# Patient Record
Sex: Female | Born: 1950 | Race: Black or African American | Hispanic: No | Marital: Married | Smoking: Never smoker
Health system: Southern US, Community
[De-identification: ages and names within clinical notes are randomized; demographics above are authoritative.]

## PROBLEM LIST (undated history)

## (undated) DIAGNOSIS — Z8041 Family history of malignant neoplasm of ovary: Secondary | ICD-10-CM

## (undated) DIAGNOSIS — Z9889 Other specified postprocedural states: Secondary | ICD-10-CM

## (undated) DIAGNOSIS — I10 Essential (primary) hypertension: Secondary | ICD-10-CM

## (undated) DIAGNOSIS — Z803 Family history of malignant neoplasm of breast: Secondary | ICD-10-CM

## (undated) DIAGNOSIS — M21612 Bunion of left foot: Secondary | ICD-10-CM

## (undated) DIAGNOSIS — T8859XA Other complications of anesthesia, initial encounter: Secondary | ICD-10-CM

## (undated) DIAGNOSIS — R232 Flushing: Secondary | ICD-10-CM

## (undated) DIAGNOSIS — T4145XA Adverse effect of unspecified anesthetic, initial encounter: Secondary | ICD-10-CM

## (undated) DIAGNOSIS — R112 Nausea with vomiting, unspecified: Secondary | ICD-10-CM

## (undated) DIAGNOSIS — T7840XA Allergy, unspecified, initial encounter: Secondary | ICD-10-CM

## (undated) DIAGNOSIS — E119 Type 2 diabetes mellitus without complications: Secondary | ICD-10-CM

## (undated) HISTORY — DX: Allergy, unspecified, initial encounter: T78.40XA

## (undated) HISTORY — DX: Bunion of left foot: M21.612

## (undated) HISTORY — DX: Flushing: R23.2

## (undated) HISTORY — DX: Family history of malignant neoplasm of ovary: Z80.41

## (undated) HISTORY — DX: Family history of malignant neoplasm of breast: Z80.3

## (undated) HISTORY — DX: Type 2 diabetes mellitus without complications: E11.9

## (undated) HISTORY — DX: Essential (primary) hypertension: I10

---
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
## 2000-05-14 HISTORY — PX: VAGINAL HYSTERECTOMY: SUR661

## 2005-06-19 ENCOUNTER — Ambulatory Visit: Payer: Self-pay | Admitting: Family Medicine

## 2005-07-03 ENCOUNTER — Encounter: Admission: RE | Admit: 2005-07-03 | Discharge: 2005-07-03 | Payer: Self-pay | Admitting: Family Medicine

## 2005-07-12 ENCOUNTER — Ambulatory Visit: Payer: Self-pay | Admitting: Family Medicine

## 2005-08-20 ENCOUNTER — Ambulatory Visit: Payer: Self-pay | Admitting: Family Medicine

## 2005-12-04 ENCOUNTER — Ambulatory Visit: Payer: Self-pay | Admitting: Family Medicine

## 2005-12-11 ENCOUNTER — Ambulatory Visit: Payer: Self-pay | Admitting: Family Medicine

## 2006-07-04 ENCOUNTER — Encounter: Admission: RE | Admit: 2006-07-04 | Discharge: 2006-07-04 | Payer: Self-pay | Admitting: Family Medicine

## 2006-07-18 ENCOUNTER — Encounter: Admission: RE | Admit: 2006-07-18 | Discharge: 2006-07-18 | Payer: Self-pay | Admitting: Family Medicine

## 2006-12-05 ENCOUNTER — Ambulatory Visit: Payer: Self-pay | Admitting: Family Medicine

## 2006-12-05 LAB — CONVERTED CEMR LAB
AST: 24 units/L (ref 0–37)
BUN: 13 mg/dL (ref 6–23)
Basophils Absolute: 0.1 10*3/uL (ref 0.0–0.1)
Bilirubin, Direct: 0.1 mg/dL (ref 0.0–0.3)
CO2: 28 meq/L (ref 19–32)
Calcium: 9.9 mg/dL (ref 8.4–10.5)
Cholesterol: 231 mg/dL (ref 0–200)
Direct LDL: 136.9 mg/dL
Glucose, Bld: 110 mg/dL — ABNORMAL HIGH (ref 70–99)
Hemoglobin: 12.5 g/dL (ref 12.0–15.0)
Lymphocytes Relative: 46.8 % — ABNORMAL HIGH (ref 12.0–46.0)
MCHC: 34.3 g/dL (ref 30.0–36.0)
MCV: 83.1 fL (ref 78.0–100.0)
Monocytes Absolute: 0.6 10*3/uL (ref 0.2–0.7)
Monocytes Relative: 8.9 % (ref 3.0–11.0)
Neutrophils Relative %: 40.2 % — ABNORMAL LOW (ref 43.0–77.0)
Nitrite: NEGATIVE
Platelets: 365 10*3/uL (ref 150–400)
Protein, U semiquant: NEGATIVE
RBC: 4.37 M/uL (ref 3.87–5.11)
RDW: 12.2 % (ref 11.5–14.6)
Sodium: 142 meq/L (ref 135–145)
TSH: 2.26 microintl units/mL (ref 0.35–5.50)
Urobilinogen, UA: 0.2
VLDL: 42 mg/dL — ABNORMAL HIGH (ref 0–40)
WBC Urine, dipstick: NEGATIVE
pH: 5.5

## 2007-01-14 ENCOUNTER — Encounter: Admission: RE | Admit: 2007-01-14 | Discharge: 2007-01-14 | Payer: Self-pay | Admitting: Family Medicine

## 2007-01-20 DIAGNOSIS — I10 Essential (primary) hypertension: Secondary | ICD-10-CM

## 2007-01-23 ENCOUNTER — Ambulatory Visit: Payer: Self-pay | Admitting: Family Medicine

## 2007-07-07 ENCOUNTER — Encounter: Admission: RE | Admit: 2007-07-07 | Discharge: 2007-07-07 | Payer: Self-pay | Admitting: Family Medicine

## 2007-10-27 ENCOUNTER — Ambulatory Visit: Payer: Self-pay | Admitting: Family Medicine

## 2007-10-27 DIAGNOSIS — J309 Allergic rhinitis, unspecified: Secondary | ICD-10-CM | POA: Insufficient documentation

## 2007-11-03 ENCOUNTER — Ambulatory Visit: Payer: Self-pay | Admitting: Family Medicine

## 2007-11-03 DIAGNOSIS — H6120 Impacted cerumen, unspecified ear: Secondary | ICD-10-CM

## 2008-01-20 ENCOUNTER — Ambulatory Visit: Payer: Self-pay | Admitting: Family Medicine

## 2008-01-20 LAB — CONVERTED CEMR LAB
ALT: 23 units/L (ref 0–35)
AST: 26 units/L (ref 0–37)
Albumin: 4.1 g/dL (ref 3.5–5.2)
Chloride: 104 meq/L (ref 96–112)
Creatinine, Ser: 0.8 mg/dL (ref 0.4–1.2)
Direct LDL: 125.5 mg/dL
Eosinophils Absolute: 0.1 10*3/uL (ref 0.0–0.7)
Glucose, Bld: 121 mg/dL — ABNORMAL HIGH (ref 70–99)
HDL: 37.7 mg/dL — ABNORMAL LOW (ref >39.0)
MCHC: 32.8 g/dL (ref 30.0–36.0)
MCV: 84.7 fL (ref 78.0–100.0)
Monocytes Absolute: 0.6 10*3/uL (ref 0.1–1.0)
Neutrophils Relative %: 37.3 % — ABNORMAL LOW (ref 43.0–77.0)
Nitrite: NEGATIVE
Platelets: 349 10*3/uL (ref 150–400)
Potassium: 3.4 meq/L — ABNORMAL LOW (ref 3.5–5.1)
RBC: 4.76 M/uL (ref 3.87–5.11)
Specific Gravity, Urine: 1.02
TSH: 3.97 microintl units/mL (ref 0.35–5.50)
VLDL: 72 mg/dL — ABNORMAL HIGH (ref 0–40)
WBC: 7 10*3/uL (ref 4.5–10.5)
pH: 6

## 2008-01-26 ENCOUNTER — Ambulatory Visit: Payer: Self-pay | Admitting: Family Medicine

## 2008-01-26 DIAGNOSIS — N951 Menopausal and female climacteric states: Secondary | ICD-10-CM

## 2008-02-25 ENCOUNTER — Ambulatory Visit: Payer: Self-pay | Admitting: Family Medicine

## 2008-04-16 ENCOUNTER — Ambulatory Visit: Payer: Self-pay | Admitting: Family Medicine

## 2008-04-26 ENCOUNTER — Ambulatory Visit: Payer: Self-pay | Admitting: Family Medicine

## 2008-07-12 ENCOUNTER — Ambulatory Visit: Payer: Self-pay | Admitting: Family Medicine

## 2008-07-12 ENCOUNTER — Encounter: Admission: RE | Admit: 2008-07-12 | Discharge: 2008-07-12 | Payer: Self-pay | Admitting: Family Medicine

## 2008-07-12 DIAGNOSIS — N3000 Acute cystitis without hematuria: Secondary | ICD-10-CM | POA: Insufficient documentation

## 2008-07-12 LAB — CONVERTED CEMR LAB
Bilirubin Urine: NEGATIVE
Specific Gravity, Urine: 1.01
Urobilinogen, UA: 0.2

## 2009-01-27 ENCOUNTER — Ambulatory Visit: Payer: Self-pay | Admitting: Family Medicine

## 2009-01-27 LAB — CONVERTED CEMR LAB
ALT: 20 units/L (ref 0–35)
AST: 25 units/L (ref 0–37)
Albumin: 4 g/dL (ref 3.5–5.2)
BUN: 11 mg/dL (ref 6–23)
Bilirubin Urine: NEGATIVE
Bilirubin, Direct: 0 mg/dL (ref 0.0–0.3)
Chloride: 105 meq/L (ref 96–112)
Cholesterol: 228 mg/dL — ABNORMAL HIGH (ref 0–200)
Creatinine, Ser: 0.9 mg/dL (ref 0.4–1.2)
HDL: 38.2 mg/dL — ABNORMAL LOW (ref >39.00)
Lymphocytes Relative: 52.3 % — ABNORMAL HIGH (ref 12.0–46.0)
Lymphs Abs: 3.6 10*3/uL (ref 0.7–4.0)
Monocytes Relative: 8.9 % (ref 3.0–12.0)
Neutro Abs: 2.5 10*3/uL (ref 1.4–7.7)
Neutrophils Relative %: 35.6 % — ABNORMAL LOW (ref 43.0–77.0)
Platelets: 354 10*3/uL (ref 150.0–400.0)
Protein, U semiquant: NEGATIVE
RBC: 4.63 M/uL (ref 3.87–5.11)
RDW: 11.8 % (ref 11.5–14.6)
Total CHOL/HDL Ratio: 6
Total Protein: 7.3 g/dL (ref 6.0–8.3)
Triglycerides: 206 mg/dL — ABNORMAL HIGH (ref 0.0–149.0)

## 2009-02-02 ENCOUNTER — Ambulatory Visit: Payer: Self-pay | Admitting: Family Medicine

## 2009-02-16 ENCOUNTER — Ambulatory Visit: Payer: Self-pay | Admitting: Family Medicine

## 2009-06-10 ENCOUNTER — Ambulatory Visit: Payer: Self-pay | Admitting: Family Medicine

## 2009-07-13 ENCOUNTER — Telehealth: Payer: Self-pay | Admitting: Family Medicine

## 2009-08-02 ENCOUNTER — Encounter: Admission: RE | Admit: 2009-08-02 | Discharge: 2009-08-02 | Payer: Self-pay | Admitting: Family Medicine

## 2010-01-27 ENCOUNTER — Ambulatory Visit: Payer: Self-pay | Admitting: Family Medicine

## 2010-01-27 LAB — CONVERTED CEMR LAB
ALT: 24 units/L (ref 0–35)
Alkaline Phosphatase: 74 units/L (ref 39–117)
BUN: 14 mg/dL (ref 6–23)
Bilirubin, Direct: 0.2 mg/dL (ref 0.0–0.3)
CO2: 30 meq/L (ref 19–32)
Chloride: 100 meq/L (ref 96–112)
Creatinine, Ser: 1 mg/dL (ref 0.4–1.2)
Eosinophils Absolute: 0.2 10*3/uL (ref 0.0–0.7)
Eosinophils Relative: 2.2 % (ref 0.0–5.0)
GFR calc non Af Amer: 76.89 mL/min (ref >60)
HCT: 39.6 % (ref 36.0–46.0)
Hemoglobin: 13 g/dL (ref 12.0–15.0)
MCHC: 32.8 g/dL (ref 30.0–36.0)
Monocytes Relative: 7.6 % (ref 3.0–12.0)
Nitrite: NEGATIVE
Platelets: 349 10*3/uL (ref 150.0–400.0)
Protein, U semiquant: NEGATIVE
RDW: 13 % (ref 11.5–14.6)
Specific Gravity, Urine: 1.025
TSH: 3.81 microintl units/mL (ref 0.35–5.50)
Total Bilirubin: 0.9 mg/dL (ref 0.3–1.2)
Triglycerides: 332 mg/dL — ABNORMAL HIGH (ref 0.0–149.0)
Urobilinogen, UA: 0.2
WBC Urine, dipstick: NEGATIVE

## 2010-02-16 ENCOUNTER — Ambulatory Visit: Payer: Self-pay | Admitting: Family Medicine

## 2010-02-16 ENCOUNTER — Encounter: Payer: Self-pay | Admitting: Family Medicine

## 2010-03-23 ENCOUNTER — Ambulatory Visit: Payer: Self-pay | Admitting: Family Medicine

## 2010-03-23 DIAGNOSIS — M79609 Pain in unspecified limb: Secondary | ICD-10-CM

## 2010-03-27 LAB — CONVERTED CEMR LAB
BUN: 12 mg/dL (ref 6–23)
CO2: 26 meq/L (ref 19–32)
Calcium: 9.9 mg/dL (ref 8.4–10.5)
Creatinine, Ser: 0.9 mg/dL (ref 0.4–1.2)
GFR calc non Af Amer: 85.06 mL/min (ref >60)
Hgb A1c MFr Bld: 6.6 % — ABNORMAL HIGH (ref 4.6–6.5)
TSH: 2.49 microintl units/mL (ref 0.35–5.50)

## 2010-04-26 ENCOUNTER — Ambulatory Visit: Payer: Self-pay | Admitting: Family Medicine

## 2010-04-28 ENCOUNTER — Ambulatory Visit: Payer: Self-pay | Admitting: Family Medicine

## 2010-05-11 ENCOUNTER — Ambulatory Visit
Admission: RE | Admit: 2010-05-11 | Discharge: 2010-05-11 | Payer: Self-pay | Source: Home / Self Care | Attending: Internal Medicine | Admitting: Internal Medicine

## 2010-06-04 ENCOUNTER — Encounter: Payer: Self-pay | Admitting: Family Medicine

## 2010-06-13 NOTE — Assessment & Plan Note (Signed)
Summary: CPX/CJR   Vital Signs:  Patient profile:   60 year old female Menstrual status:  hysterectomy Height:      63 inches Weight:      178 pounds BMI:     31.65 Temp:     98.1 degrees F oral BP sitting:   130 / 90  (left arm) Cuff size:   regular  Vitals Entered By: Kern Reap CMA Duncan Dull) (February 16, 2010 1:53 PM) CC: cpx Is Patient Diabetic? Yes   CC:  cpx.  History of Present Illness: Erin Hatfield is a delightful, 60 year old female, married, nonsmoker, who comes in today for physical evaluation because of a history of hypertension, allergic rhinitis postmenopausal vaginal dryness.  For hypertension.  She takes Tenoretic 50 -- 25 daily.  BP 130/90.  She also uses Premarin vaginal cream for vaginal dryness, 81 mg, baby aspirin, Zyrtec, 10 mg daily, Singulair, 10 mg daily, steroid nasal spray b.i.d. for allergic rhinitis.  Last year.  She took a round of prednisone.  She stopped it didn't help and that symptoms got worse therefore, she went to an allergist to put her on the above medican.  She gets routine eye care, dental care, BSE monthly, any mammography, colonoscopy, normal 2006, tetanus, 2007, seasonal flu shot today.  Allergies (verified): 1)  ! * Trees 2)  ! * Mold  Past History:  Past medical, surgical, family and social histories (including risk factors) reviewed, and no changes noted (except as noted below).  Past Medical History: Reviewed history from 01/26/2008 and no changes required. Hypertension Foot Bunion Allergic rhinitis childbirth x 1 TAH 04, fibroids postmenopausal hot flashes  Past Surgical History: Reviewed history from 01/20/2007 and no changes required. CB x1 TAH Hysterectomy  Family History: Reviewed history from 01/20/2007 and no changes required. Family History Diabetes 1st degree relative Family History Hypertension  Social History: Reviewed history from 01/26/2008 and no changes required. 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 No apparent alopecia or balding. Eyes:  No corneal or conjunctival inflammation noted. EOMI. Perrla. Funduscopic exam benign, without hemorrhages, exudates or papilledema. Vision grossly normal. Ears:  External ear exam shows no significant lesions or deformities.  Otoscopic examination reveals clear canals, tympanic membranes are intact bilaterally without bulging, retraction, inflammation or discharge. Hearing is grossly normal bilaterally. Nose:  External nasal examination shows no deformity or inflammation. Nasal mucosa are pink and moist without lesions or exudates. Mouth:  Oral mucosa and oropharynx without lesions or exudates.  Teeth in good repair. Neck:  No deformities, masses, or tenderness noted. Chest Wall:  No deformities, masses, or tenderness noted. Breasts:  No mass, nodules, thickening, tenderness, bulging, retraction, inflamation, nipple discharge or skin changes noted.   Lungs:  Normal respiratory effort, chest expands symmetrically. Lungs are clear to auscultation, no crackles or wheezes. Heart:  Normal rate and regular rhythm. S1 and S2 normal without gallop, murmur, click, rub or other extra sounds. Abdomen:  Bowel sounds positive,abdomen soft and non-tender without masses, organomegaly or hernias noted. Rectal:  No external abnormalities noted. Normal sphincter tone. No rectal masses or tenderness. Genitalia:  Pelvic Exam:        External: normal female genitalia without lesions or masses        Vagina: normal without lesions or masses  Cervix: normal without lesions or masses        Adnexa: normal bimanual exam without masses or fullness        Uterus: normal by  palpation        Pap smear: not performed Msk:  No deformity or scoliosis noted of thoracic or lumbar spine.   Pulses:  R and L carotid,radial,femoral,dorsalis pedis and posterior tibial pulses are full and equal bilaterally Extremities:  No clubbing, cyanosis, edema, or deformity noted with normal full range of motion of all joints.   Neurologic:  No cranial nerve deficits noted. Station and gait are normal. Plantar reflexes are down-going bilaterally. DTRs are symmetrical throughout. Sensory, motor and coordinative functions appear intact. Skin:  Intact without suspicious lesions or rashes Cervical Nodes:  No lymphadenopathy noted Axillary Nodes:  No palpable lymphadenopathy Inguinal Nodes:  No significant adenopathy Psych:  Cognition and judgment appear intact. Alert and cooperative with normal attention span and concentration. No apparent delusions, illusions, hallucinations   Impression & Recommendations:  Problem # 1:  ALLERGIC RHINITIS (ICD-477.9) Assessment Improved  Her updated medication list for this problem includes:    Zyrtec Hives Relief 10 Mg Tabs (Cetirizine hcl) .Marland Kitchen... Take one daily as needed    Flonase 50 Mcg/act Susp (Fluticasone propionate) .Marland Kitchen... 2 sprays per nostril once daily  Orders: Prescription Created Electronically [phone])  Problem # 2:  HYPERTENSION (ICD-401.9) Assessment: Improved  Her updated medication list for this problem includes:    Tenoretic 50 50-25 Mg Tabs (Atenolol-chlorthalidone) ..... Once daily  Orders: Prescription Created Electronically [phone])  Problem # 3:  Preventive Health Care (ICD-V70.0) Assessment: Unchanged  Complete Medication List: 1)  Tenoretic 50 50-25 Mg Tabs (Atenolol-chlorthalidone) .... Once daily 2)  Omega-3 350 Mg Caps (Omega-3 fatty acids) .... Once daily 3)  Aspirin 81 Mg Tbec (Aspirin) .... Once daily 4)  Vitamin D 1000 Unit Caps (Cholecalciferol) .... Once daily 5)  Premarin 0.625 Mg/gm Crea (Estrogens,  conjugated) .... Uad 6)  Prednisone 20 Mg Tabs (Prednisone) .... Uad 7)  Zyrtec Hives Relief 10 Mg Tabs (Cetirizine hcl) .... Take one daily as needed 8)  Singulair 10 Mg Tabs (Montelukast sodium) .... Take one tab by mouth once daily as needed 9)  Flonase 50 Mcg/act Susp (Fluticasone propionate) .... 2 sprays per nostril once daily  Patient Instructions: 1)  7 nights every month, use the cortisone cream, apply small amounts with a Q-tip to each ear canal. 2)  Please schedule a follow-up appointment in 1 year. 3)  It is important that you exercise regularly at least 20 minutes 5 times a week. If you develop chest pain, have severe difficulty breathing, or feel very tired , stop exercising immediately and seek medical attention. 4)  Schedule your mammogram. 5)  Schedule a colonoscopy/sigmoidoscopy to help detect colon cancer. 6)  Take calcium +Vitamin D daily. 7)  Take an Aspirin every day. Prescriptions: FLONASE 50 MCG/ACT SUSP (FLUTICASONE PROPIONATE) 2 sprays per nostril once daily  #3 x 3   Entered and Authorized by:   Roderick Pee MD   Signed by:   Roderick Pee MD on 02/16/2010   Method used:   Electronically to        [facility]  [address] [id number] (retail)       [address]       [place], [place]  [address]       Ph: [phone] or [phone]  Fax: [phone]   RxID:   [id number] SINGULAIR 10 MG TABS (MONTELUKAST SODIUM) take one tab by mouth once daily as needed  #100 x 3   Entered and Authorized by:   Roderick Pee MD   Signed by:   Roderick Pee MD on 02/16/2010   Method used:   [facility]ronically to        [facility]  [address] [id number] ([facility]ail)       [address]       [place], [place]  [address]       Ph: [phone] or [phone]       Fax: [phone]   RxID:   [id number] PREMARIN 0.625 MG/GM CREA (ESTROGENS, CONJUGATED) UAD  #3 tubes x 6   Entered and Authorized by:   Roderick Pee MD   Signed by:   Roderick Pee MD on 02/16/2010   Method used:   [facility] to        [facility]  [address] [id number] ([facility]       [address]       [place], [place]  [address]       Ph: [phone] or [phone]       Fax: [phone]   RxID:   [id number] TENORETIC 50 50-25 MG  TABS (ATENOLOL-CHLORTHALIDONE) once daily  #100 x 3   Entered and Authorized by:   Roderick Pee MD   Signed by:   Roderick Pee MD on 02/16/2010   Method used:   [facility]ronically to        [facility]  [address] [id number] ([facility]       [address]       [place], [place]  [address]       Ph: [phone] or [phone]       Fax: [phone]   RxID:   [id number]

## 2010-06-13 NOTE — Progress Notes (Signed)
Summary: allergies worse  Phone Note Call from Patient Call back at Home Phone [phone]   Caller: Patient Summary of Call: allergies got even worse after coming off prednisone- sneezing, coughing and drainage. Another OV or medicine[facility] Initial call taken by: Raechel Ache, RN,  July 13, 2009 8:22 AM  Follow-up for Phone Call        I would recommend OTC Claritin, regular, one daily in the morning or otc zyrtec  regular, one in the evening Follow-up by: Roderick Pee MD,  July 13, 2009 9:56 AM  Additional Follow-up for Phone Call Additional follow up Details #1::        Phone Call Completed Additional Follow-up by: Kern Reap CMA Duncan Dull),  July 13, 2009 12:55 PM

## 2010-06-13 NOTE — Assessment & Plan Note (Signed)
Summary: sinus/cough/sore throat/per Rachel/cjr   Vital Signs:  Patient profile:   60 year old female Menstrual status:  hysterectomy Weight:      182 pounds Temp:     98.6 degrees F oral BP sitting:   116 / 80  Vitals Entered By: Kern Reap CMA Duncan Dull) (June 10, 2009 10:57 AM) CC: sinus and allergy complaints Is Patient Diabetic? No   CC:  sinus and allergy complaints.  History of Present Illness: Erin Hatfield is a 60 year old female with underlying allergic rhinitis.  This been on Claritin, 10 mg daily, x 3 weeks, with no relief of her symptoms.  She has had congestion, postnasal drip, sneezing, and cough.  No wheezing.  Environmental review of systems negative  Current Medications (verified): 1)  Tenoretic 50 50-25 Mg  Tabs (Atenolol-Chlorthalidone) .... Once Daily 2)  Omega-3 350 Mg Caps (Omega-3 Fatty Acids) .... Once Daily 3)  Aspirin 81 Mg Tbec (Aspirin) .... Once Daily 4)  Vitamin D 1000 Unit Caps (Cholecalciferol) .... Once Daily 5)  Premarin 0.625 Mg/gm Crea (Estrogens, Conjugated) .... Uad  Allergies (verified): No Known Drug Allergies  Social History: Reviewed history from 01/26/2008 and no changes required. 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 No apparent alopecia or balding. Eyes:  No corneal or conjunctival inflammation noted. EOMI. Perrla. Funduscopic exam benign, without hemorrhages, exudates or papilledema. Vision grossly normal. Ears:  External ear exam shows no significant lesions or deformities.  Otoscopic examination reveals clear canals, tympanic membranes are intact bilaterally without bulging, retraction, inflammation or discharge. Hearing is grossly normal  bilaterally. Nose:  External nasal examination shows no deformity or inflammation. Nasal mucosa are pink and moist without lesions or exudates. Mouth:  Oral mucosa and oropharynx without lesions or exudates.  Teeth in good repair. Neck:  No deformities, masses, or tenderness noted. Chest Wall:  No deformities, masses, or tenderness noted. Lungs:  Normal respiratory effort, chest expands symmetrically. Lungs are clear to auscultation, no crackles or wheezes.   Impression & Recommendations:  Problem # 1:  ALLERGIC RHINITIS (ICD-477.9) Assessment Deteriorated  Orders: Prescription Created Electronically [phone])  Complete Medication List: 1)  Tenoretic 50 50-25 Mg Tabs (Atenolol-chlorthalidone) .... Once daily 2)  Omega-3 350 Mg Caps (Omega-3 fatty acids) .... Once daily 3)  Aspirin 81 Mg Tbec (Aspirin) .... Once daily 4)  Vitamin D 1000 Unit Caps (Cholecalciferol) .... Once daily 5)  Premarin 0.625 Mg/gm Crea (Estrogens, conjugated) .... Uad 6)  Prednisone 20 Mg Tabs (Prednisone) .... Uad  Patient Instructions: 1)  begin prednisone two tabs x 3 days, one x 3 days, a half x 3 days, then a half a tablet Monday, Wednesday, Friday, for a two week taper.  Once he finished the prednisone take plain Claritin in the morning or plain Zyrtec at bedtime Prescriptions: PREDNISONE 20 MG TABS (PREDNISONE) UAD  #30 x 1   Entered and Authorized by:   Roderick Pee MD   Signed by:   Roderick Pee MD on 06/10/2009   Method used:   [facility] to        [facility]  [address] [id number]       [address]       [place], [place]  [address]  Ph: [phone] or [phone]       Fax: [phone]   RxID:   [id number]

## 2010-06-13 NOTE — Assessment & Plan Note (Signed)
Summary: bilateral feet pain/dm   Vital Signs:  Patient profile:   60 year old female Menstrual status:  hysterectomy Weight:      179 pounds Temp:     98.5 degrees F oral BP sitting:   150 / 90  (left arm) Cuff size:   regular  Vitals Entered By: Kern Reap CMA Duncan Dull) (March 23, 2010 9:18 AM) CC: pain,tingling, itching with feet   CC:  pain, tingling, and itching with feet.  History of Present Illness: Erin Hatfield is a 60 year old female, nonsmoker, who comes in today for a 8 week history of intermittent foot pain.  She states the discomfort involves both, feet it's intermittent pain it's a tingling-like sensation, which she knows, and it goes up to her toes.  No history of trauma.  No history of diabetes.  Medications are unchanged.  BP normal  Allergies: 1)  ! * Trees 2)  ! * Mold  Review of Systems      See HPI  Physical Exam  General:  Well-developed,well-nourished,in no acute distress; alert,appropriate and cooperative throughout examination Msk:  No deformity or scoliosis noted of thoracic or lumbar spine.   Pulses:  R and L carotid,radial,femoral,dorsalis pedis and posterior tibial pulses are full and equal bilaterally Extremities:  No clubbing, cyanosis, edema, or deformity noted with normal full range of motion of all joints.   Neurologic:  No cranial nerve deficits noted. Station and gait are normal. Plantar reflexes are down-going bilaterally. DTRs are symmetrical throughout. Sensory, motor and coordinative functions appear intact.   Impression & Recommendations:  Problem # 1:  FOOT PAIN, BILATERAL (ICD-729.5) Assessment New  Orders: Venipuncture (27253) TLB-BMP (Basic Metabolic Panel-BMET) (80048-METABOL) TLB-A1C / Hgb A1C (Glycohemoglobin) (83036-A1C) TLB-B12 + Folate Pnl ([id number]-V95/GLO) TLB-TSH (Thyroid Stimulating Hormone) (84443-TSH) Specimen Handling (75643)  Complete Medication List: 1)  Tenoretic 50 50-25 Mg Tabs  (Atenolol-chlorthalidone) .... Once daily 2)  Omega-3 350 Mg Caps (Omega-3 fatty acids) .... Once daily 3)  Aspirin 81 Mg Tbec (Aspirin) .... Once daily 4)  Vitamin D 1000 Unit Caps (Cholecalciferol) .... Once daily 5)  Premarin 0.625 Mg/gm Crea (Estrogens, conjugated) .... Uad 6)  Prednisone 20 Mg Tabs (Prednisone) .... Uad 7)  Zyrtec Hives Relief 10 Mg Tabs (Cetirizine hcl) .... Take one daily as needed 8)  Singulair 10 Mg Tabs (Montelukast sodium) .... Take one tab by mouth once daily as needed 9)  Flonase 50 Mcg/act Susp (Fluticasone propionate) .... 2 sprays per nostril once daily  Patient Instructions: 1)  you can take 600 mg of Motrin twice daily with food. 2)  Call Dr. Toni Arthurs orthopedist for further evaluation   Orders Added: 1)  Venipuncture [36415] 2)  TLB-BMP (Basic Metabolic Panel-BMET) [80048-METABOL] 3)  TLB-A1C / Hgb A1C (Glycohemoglobin) [83036-A1C] 4)  TLB-B12 + Folate Pnl [82746_82607-B12/FOL] 5)  TLB-TSH (Thyroid Stimulating Hormone) [84443-TSH] 6)  Est. Patient Level III [32951] 7)  Specimen Handling [99000]

## 2010-06-15 NOTE — Assessment & Plan Note (Signed)
Summary: read tb test/cjr/@8 :15am/AS PER DR/RCD  Nurse Visit   Allergies: 1)  ! * Trees 2)  ! * Mold

## 2010-06-15 NOTE — Assessment & Plan Note (Signed)
Summary: ear clogged/dm   Vital Signs:  Patient profile:   60 year old female Menstrual status:  hysterectomy Weight:      175 pounds Pulse rate:   84 / minute BP sitting:   168 / 98  (right arm)  Vitals Entered By: Kyung Rudd, CMA (May 11, 2010 2:13 PM) CC: rt ear clogged   CC:  rt ear clogged.  History of Present Illness: Patient presents to clinic as a workin for evaluation of decreased hearing. Notes 2d h/o right ear being "clogged" with diminished hearing. Denies ear pain, drainage, f/c. C/o chronic nasal congestion and drainage recently worsening. BP elevated and reviewed home bp's typically normotensive  ~130/75 recently. Compliant with BP medication without adverse effect.   Current Medications (verified): 1)  Tenoretic 50 50-25 Mg  Tabs (Atenolol-Chlorthalidone) .... Once Daily 2)  Omega-3 350 Mg Caps (Omega-3 Fatty Acids) .... Once Daily 3)  Aspirin 81 Mg Tbec (Aspirin) .... Once Daily 4)  Vitamin D 1000 Unit Caps (Cholecalciferol) .... Once Daily 5)  Premarin 0.625 Mg/gm Crea (Estrogens, Conjugated) .... Uad 6)  Prednisone 20 Mg Tabs (Prednisone) .... Uad 7)  Zyrtec Hives Relief 10 Mg Tabs (Cetirizine Hcl) .... Take One Daily As Needed 8)  Singulair 10 Mg Tabs (Montelukast Sodium) .... Take One Tab By Mouth Once Daily As Needed 9)  Flonase 50 Mcg/act Susp (Fluticasone Propionate) .... 2 Sprays Per Nostril Once Daily  Allergies (verified): 1)  ! * Trees 2)  ! * Mold  Past History:  Past medical, surgical, family and social histories (including risk factors) reviewed for relevance to current acute and chronic problems.  Past Medical History: Reviewed history from 01/26/2008 and no changes required. Hypertension Foot Bunion Allergic rhinitis childbirth x 1 TAH 04, fibroids postmenopausal hot flashes  Past Surgical History: Reviewed history from 01/20/2007 and no changes required. CB x1 TAH Hysterectomy  Family History: Reviewed history from  01/20/2007 and no changes required. Family History Diabetes 1st degree relative Family History Hypertension  Social History: Reviewed history from 01/26/2008 and no changes required. 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 No apparent alopecia or balding. Eyes:  pupils equal, pupils round, corneas and lenses clear, and no injection.   Ears:  R ear with cerumen impaction. No canal erythema or drainage.L ear normal and no external deformities.   Nose:  External nasal examination shows no deformity or inflammation. Nasal mucosa are pink and moist without lesions or exudates. Mouth:  Oral mucosa and oropharynx without lesions or exudates.  Teeth in good repair.   Impression & Recommendations:  Problem # 1:  CERUMEN IMPACTION (ICD-380.4) Assessment Deteriorated  Irrigation performed successfully without complication. Pt noted immediate improvement of symptoms. Right ear re-examined and found to have patent canal without obstruction.  Orders: Cerumen Impaction Removal (16109)  Problem # 2:  ALLERGIC RHINITIS (ICD-477.9) Assessment: Deteriorated Recommend using flonase once daily instead of as needed.  Her updated medication list for this problem includes:    Zyrtec Hives Relief 10 Mg Tabs (Cetirizine hcl) .Marland Kitchen... Take one daily as needed    Flonase 50 Mcg/act Susp (Fluticasone propionate) .Marland Kitchen... 2 sprays per nostril once daily  Problem # 3:  HYPERTENSION (ICD-401.9) Assessment: Unchanged Isolated elevation with normotensive readings at home. Continue current regimen and followup as scheduled.  Her updated medication list for this problem includes:    Tenoretic 50 50-25 Mg Tabs (Atenolol-chlorthalidone) ..... Once  daily  Complete Medication List: 1)  Tenoretic 50 50-25 Mg Tabs (Atenolol-chlorthalidone) .... Once daily 2)  Omega-3 350 Mg Caps (Omega-3 fatty acids) .... Once daily 3)  Aspirin 81 Mg Tbec (Aspirin) .... Once daily 4)  Vitamin D 1000 Unit Caps (Cholecalciferol) .... Once daily 5)  Premarin 0.625 Mg/gm Crea (Estrogens, conjugated) .... Uad 6)  Prednisone 20 Mg Tabs (Prednisone) .... Uad 7)  Zyrtec Hives Relief 10 Mg Tabs (Cetirizine hcl) .... Take one daily as needed 8)  Singulair 10 Mg Tabs (Montelukast sodium) .... Take one tab by mouth once daily as needed 9)  Flonase 50 Mcg/act Susp (Fluticasone propionate) .... 2 sprays per nostril once daily   Orders Added: 1)  Cerumen Impaction Removal [13086] 2)  Est. Patient Level IV [57846]

## 2010-06-15 NOTE — Assessment & Plan Note (Signed)
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  Allergies: 1)  ! * Trees 2)  ! * Mold   Complete Medication List: 1)  Tenoretic 50 50-25 Mg Tabs (Atenolol-chlorthalidone) .... Once daily 2)  Omega-3 350 Mg Caps (Omega-3 fatty acids) .... Once daily 3)  Aspirin 81 Mg Tbec (Aspirin) .... Once daily 4)  Vitamin D 1000 Unit Caps (Cholecalciferol) .... Once daily 5)  Premarin 0.625 Mg/gm Crea (Estrogens, conjugated) .... Uad 6)  Prednisone 20 Mg Tabs (Prednisone) .... Uad 7)  Zyrtec Hives Relief 10 Mg Tabs (Cetirizine hcl) .... Take one daily as needed 8)  Singulair 10 Mg Tabs (Montelukast sodium) .... Take one tab by mouth once daily as needed 9)  Flonase 50 Mcg/act Susp (Fluticasone propionate) .... 2 sprays per nostril once daily  Other Orders: TB Skin Test [phone]) Admin 1st Vaccine (57846)   Orders Added: 1)  TB Skin Test [86580] 2)  Admin 1st Vaccine [90471]   Immunizations Administered:  PPD Skin Test:    Vaccine Type: PPD    Site: left forearm    Mfr: Sanofi Pasteur    Dose: 0.1 ml    Route: ID    Given by: Kern Reap CMA (AAMA)    Exp. Date: 03/16/2011    Lot #: [id number]    Physician counseled: yes  PPD Results    Date of reading: 04/28/2010    Results: < 5mm    Interpretation: negative   Immunizations Administered:  PPD Skin Test:    Vaccine Type: PPD    Site: left forearm    Mfr: Sanofi Pasteur    Dose: 0.1 ml    Route: ID    Given by: Kern Reap CMA (AAMA)    Exp. Date: 03/16/2011    Lot #: [id number]    Physician counseled: yes

## 2010-06-15 NOTE — Assessment & Plan Note (Signed)
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 * Trees 2)  ! * Mold

## 2010-07-05 ENCOUNTER — Other Ambulatory Visit: Payer: Self-pay | Admitting: Family Medicine

## 2010-07-05 DIAGNOSIS — Z1231 Encounter for screening mammogram for malignant neoplasm of breast: Secondary | ICD-10-CM

## 2010-08-07 ENCOUNTER — Ambulatory Visit
Admission: RE | Admit: 2010-08-07 | Discharge: 2010-08-07 | Disposition: A | Discharge status: Home / Self Care | Payer: BC Managed Care – PPO | Source: Ambulatory Visit | Attending: Family Medicine | Admitting: Family Medicine

## 2010-08-07 DIAGNOSIS — Z1231 Encounter for screening mammogram for malignant neoplasm of breast: Secondary | ICD-10-CM

## 2011-02-13 ENCOUNTER — Encounter: Payer: Self-pay | Admitting: Family Medicine

## 2011-02-13 ENCOUNTER — Ambulatory Visit (INDEPENDENT_AMBULATORY_CARE_PROVIDER_SITE_OTHER): Payer: BC Managed Care – PPO | Admitting: Family Medicine

## 2011-02-13 DIAGNOSIS — Z23 Encounter for immunization: Secondary | ICD-10-CM

## 2011-02-13 DIAGNOSIS — H6120 Impacted cerumen, unspecified ear: Secondary | ICD-10-CM

## 2011-02-13 NOTE — Patient Instructions (Signed)
Return prn 

## 2011-02-13 NOTE — Progress Notes (Signed)
  Subjective:    Patient ID: Erin Hatfield, female    DOB: [birth year], 60 y.o.   MRN: [id number]  HPI  Erin Hatfield is a delightful, 60 year old female, who comes in today for evaluation of bilateral hearing loss.  She has had a history of cerumen impactions in the past.  Review of Systems ENT review of systems negative except for hearing loss   Objective:   Physical Exam Well-developed well-nourished, female, in no acute distress.  Examination of the ears showed bilateral cerumen impactions, which were removed by irrigation       Assessment & Plan:  Hearing loss, secondary to cerumen impactions........... Cerumen removed by irrigation

## 2011-03-13 ENCOUNTER — Other Ambulatory Visit (INDEPENDENT_AMBULATORY_CARE_PROVIDER_SITE_OTHER): Payer: BC Managed Care – PPO

## 2011-03-13 DIAGNOSIS — Z0000 Encounter for general adult medical examination without abnormal findings: Secondary | ICD-10-CM

## 2011-03-13 LAB — POCT URINALYSIS DIPSTICK
Ketones, UA: NEGATIVE
Nitrite, UA: NEGATIVE
Spec Grav, UA: 1.025
Urobilinogen, UA: 1

## 2011-03-13 LAB — CBC WITH DIFFERENTIAL/PLATELET
Basophils Relative: 0.8 % (ref 0.0–3.0)
Eosinophils Relative: 2.8 % (ref 0.0–5.0)
HCT: 39 % (ref 36.0–46.0)
Hemoglobin: 12.7 g/dL (ref 12.0–15.0)
Lymphocytes Relative: 46.4 % — ABNORMAL HIGH (ref 12.0–46.0)
MCHC: 32.5 g/dL (ref 30.0–36.0)
MCV: 85.3 fl (ref 78.0–100.0)
Monocytes Relative: 8.2 % (ref 3.0–12.0)
Neutro Abs: 3 10*3/uL (ref 1.4–7.7)
Neutrophils Relative %: 41.8 % — ABNORMAL LOW (ref 43.0–77.0)
WBC: 7.1 10*3/uL (ref 4.5–10.5)

## 2011-03-13 LAB — LIPID PANEL
Cholesterol: 211 mg/dL — ABNORMAL HIGH (ref 0–200)
HDL: 42.3 mg/dL (ref >39.00)
Total CHOL/HDL Ratio: 5
Triglycerides: 216 mg/dL — ABNORMAL HIGH (ref 0.0–149.0)
VLDL: 43.2 mg/dL — ABNORMAL HIGH (ref 0.0–40.0)

## 2011-03-13 LAB — BASIC METABOLIC PANEL
BUN: 13 mg/dL (ref 6–23)
CO2: 26 mEq/L (ref 19–32)
Chloride: 107 mEq/L (ref 96–112)
Creatinine, Ser: 0.9 mg/dL (ref 0.4–1.2)
Glucose, Bld: 123 mg/dL — ABNORMAL HIGH (ref 70–99)
Potassium: 3.4 mEq/L — ABNORMAL LOW (ref 3.5–5.1)

## 2011-03-13 LAB — HEPATIC FUNCTION PANEL
AST: 22 U/L (ref 0–37)
Alkaline Phosphatase: 73 U/L (ref 39–117)
Total Protein: 7.3 g/dL (ref 6.0–8.3)

## 2011-03-13 LAB — LDL CHOLESTEROL, DIRECT: Direct LDL: 151.9 mg/dL

## 2011-03-20 ENCOUNTER — Ambulatory Visit (INDEPENDENT_AMBULATORY_CARE_PROVIDER_SITE_OTHER): Payer: BC Managed Care – PPO | Admitting: Family Medicine

## 2011-03-20 ENCOUNTER — Encounter: Payer: Self-pay | Admitting: Family Medicine

## 2011-03-20 VITALS — BP 130/90 | Temp 97.6°F | Ht 63.0 in | Wt 174.0 lb

## 2011-03-20 DIAGNOSIS — I10 Essential (primary) hypertension: Secondary | ICD-10-CM

## 2011-03-20 DIAGNOSIS — N951 Menopausal and female climacteric states: Secondary | ICD-10-CM

## 2011-03-20 DIAGNOSIS — J3081 Allergic rhinitis due to animal (cat) (dog) hair and dander: Secondary | ICD-10-CM

## 2011-03-20 MED ORDER — ESTROGENS, CONJUGATED 0.625 MG/GM VA CREA: 0.6250 g | TOPICAL_CREAM | Freq: Every day | VAGINAL | Status: DC

## 2011-03-20 MED ORDER — FLUTICASONE PROPIONATE 50 MCG/ACT NA SUSP: 2.0000 | Freq: Every day | NASAL | Status: DC

## 2011-03-20 MED ORDER — ATENOLOL-CHLORTHALIDONE 50-25 MG PO TABS: 1.0000 | ORAL_TABLET | Freq: Every day | ORAL | Status: DC

## 2011-03-20 NOTE — Progress Notes (Signed)
  Subjective:    Patient ID: Erin Hatfield, female    DOB: February 04, 1951, 60 y.o.   MRN: [id number]  HPI Erin Hatfield is a delightful, 60 year old, married female, nonsmoker comes in today for general physical examination because of a history of allergic rhinitis, hypertension, postmenopausal vaginal dryness.  She takes over-the-counter Claritin for allergic rhinitis or with his fall.  She is on all the symptoms.  Discussed adding a steroid nasal spray.  She takes Tenoretic 50 -- 25 daily for hypertension.  BP 130/90.  She quit taking the Premarin cream, but she has a lot of symptoms of vaginal dryness.  Encouraged her to restart it.  She gets routine eye care, dental care, BSE monthly, and he mammography, colonoscopy, normal, and GI, tetanus, 2007, seasonal flu shot done 2012, information given on shingles   Review of Systems  Constitutional: Negative.   HENT: Negative.   Eyes: Negative.   Respiratory: Negative.   Cardiovascular: Negative.   Gastrointestinal: Negative.   Genitourinary: Negative.   Musculoskeletal: Negative.   Neurological: Negative.   Hematological: Negative.   Psychiatric/Behavioral: Negative.        Objective:   Physical Exam  Constitutional: She appears well-developed and well-nourished.  HENT:  Head: Normocephalic and atraumatic.  Right Ear: External ear normal.  Left Ear: External ear normal.  Nose: Nose normal.  Mouth/Throat: Oropharynx is clear and moist.  Eyes: EOM are normal. Pupils are equal, round, and reactive to light.  Neck: Normal range of motion. Neck supple. No thyromegaly present.  Cardiovascular: Normal rate, regular rhythm, normal heart sounds and intact distal pulses.  Exam reveals no gallop and no friction rub.   No murmur heard. Pulmonary/Chest: Effort normal and breath sounds normal.  Abdominal: Soft. Bowel sounds are normal. She exhibits no distension and no mass. There is no tenderness. There is no rebound.  Genitourinary: Vagina  normal and uterus normal. Guaiac negative stool. No vaginal discharge found.       Bilateral breast exam shows multiple fibrocystic changes and thickening.  Both breasts from the 3 o'clock to the 9 o'clock position on the periphery.  No unusual lesions  Musculoskeletal: Normal range of motion.  Lymphadenopathy:    She has no cervical adenopathy.  Neurological: She is alert. She has normal reflexes. No cranial nerve deficit. She exhibits normal muscle tone. Coordination normal.  Skin: Skin is warm and dry.  Psychiatric: She has a normal mood and affect. Her behavior is normal. Judgment and thought content normal.          Assessment & Plan:  Healthy female.  Hypertension.  Continue current medications.  Allergic rhinitis.  Continue Zyrtec add a steroid nasal spray.  Postmenopausal vaginal dryness restart Premarin vaginal cream twice weekly

## 2011-03-20 NOTE — Patient Instructions (Signed)
Continue your current medications  Add one shot of steroid nasal spray up each nostril at bedtime and use small amounts of the Premarin vaginal cream twice weekly.  Return in one year or sooner if any problems

## 2011-03-21 ENCOUNTER — Telehealth: Payer: Self-pay | Admitting: *Deleted

## 2011-03-21 NOTE — Telephone Encounter (Signed)
Pt calls stating her ear has been clogged up since irrigation yesterday, and wants to know what to do ?

## 2011-03-22 NOTE — Telephone Encounter (Signed)
Call and reassured that this will clear over time, if she's still having symptoms, come in next Monday or Tuesday to recheck her ears

## 2011-03-23 NOTE — Telephone Encounter (Signed)
Spoke with patient and her ears have "cleared up now".

## 2011-04-11 ENCOUNTER — Ambulatory Visit (INDEPENDENT_AMBULATORY_CARE_PROVIDER_SITE_OTHER): Payer: BC Managed Care – PPO | Admitting: *Deleted

## 2011-04-11 DIAGNOSIS — Z23 Encounter for immunization: Secondary | ICD-10-CM

## 2011-04-11 DIAGNOSIS — Z2911 Encounter for prophylactic immunotherapy for respiratory syncytial virus (RSV): Secondary | ICD-10-CM

## 2011-07-03 ENCOUNTER — Other Ambulatory Visit: Payer: Self-pay | Admitting: Family Medicine

## 2011-07-03 DIAGNOSIS — Z1231 Encounter for screening mammogram for malignant neoplasm of breast: Secondary | ICD-10-CM

## 2011-07-17 ENCOUNTER — Ambulatory Visit: Payer: BC Managed Care – PPO | Admitting: Family Medicine

## 2011-08-08 ENCOUNTER — Ambulatory Visit
Admission: RE | Admit: 2011-08-08 | Discharge: 2011-08-08 | Disposition: A | Discharge status: Home / Self Care | Payer: BC Managed Care – PPO | Source: Ambulatory Visit | Attending: Family Medicine | Admitting: Family Medicine

## 2011-08-08 DIAGNOSIS — Z1231 Encounter for screening mammogram for malignant neoplasm of breast: Secondary | ICD-10-CM

## 2012-03-17 ENCOUNTER — Other Ambulatory Visit (INDEPENDENT_AMBULATORY_CARE_PROVIDER_SITE_OTHER): Payer: BC Managed Care – PPO

## 2012-03-17 DIAGNOSIS — Z0000 Encounter for general adult medical examination without abnormal findings: Secondary | ICD-10-CM

## 2012-03-17 LAB — CBC WITH DIFFERENTIAL/PLATELET
Eosinophils Relative: 1.8 % (ref 0.0–5.0)
Lymphocytes Relative: 49.4 % — ABNORMAL HIGH (ref 12.0–46.0)
Monocytes Absolute: 0.6 10*3/uL (ref 0.1–1.0)
Monocytes Relative: 7.9 % (ref 3.0–12.0)
Neutrophils Relative %: 40 % — ABNORMAL LOW (ref 43.0–77.0)
Platelets: 352 10*3/uL (ref 150.0–400.0)
WBC: 7.5 10*3/uL (ref 4.5–10.5)

## 2012-03-17 LAB — POCT URINALYSIS DIPSTICK
Bilirubin, UA: NEGATIVE
Ketones, UA: NEGATIVE
Nitrite, UA: NEGATIVE
pH, UA: 6

## 2012-03-17 LAB — BASIC METABOLIC PANEL
BUN: 13 mg/dL (ref 6–23)
Calcium: 9.7 mg/dL (ref 8.4–10.5)
Creatinine, Ser: 0.8 mg/dL (ref 0.4–1.2)
GFR: 90.49 mL/min (ref >60.00)

## 2012-03-17 LAB — LIPID PANEL
Cholesterol: 250 mg/dL — ABNORMAL HIGH (ref 0–200)
VLDL: 72.8 mg/dL — ABNORMAL HIGH (ref 0.0–40.0)

## 2012-03-17 LAB — HEPATIC FUNCTION PANEL
ALT: 26 U/L (ref 0–35)
AST: 26 U/L (ref 0–37)
Alkaline Phosphatase: 75 U/L (ref 39–117)
Bilirubin, Direct: 0 mg/dL (ref 0.0–0.3)
Total Bilirubin: 0.9 mg/dL (ref 0.3–1.2)

## 2012-03-24 ENCOUNTER — Ambulatory Visit (INDEPENDENT_AMBULATORY_CARE_PROVIDER_SITE_OTHER): Payer: BC Managed Care – PPO | Admitting: Family Medicine

## 2012-03-24 ENCOUNTER — Encounter: Payer: Self-pay | Admitting: Family Medicine

## 2012-03-24 VITALS — BP 140/90 | Temp 98.2°F | Ht 63.0 in | Wt 179.0 lb

## 2012-03-24 DIAGNOSIS — N951 Menopausal and female climacteric states: Secondary | ICD-10-CM

## 2012-03-24 DIAGNOSIS — R7309 Other abnormal glucose: Secondary | ICD-10-CM

## 2012-03-24 DIAGNOSIS — J3081 Allergic rhinitis due to animal (cat) (dog) hair and dander: Secondary | ICD-10-CM

## 2012-03-24 DIAGNOSIS — I10 Essential (primary) hypertension: Secondary | ICD-10-CM

## 2012-03-24 DIAGNOSIS — H6120 Impacted cerumen, unspecified ear: Secondary | ICD-10-CM

## 2012-03-24 DIAGNOSIS — Z23 Encounter for immunization: Secondary | ICD-10-CM

## 2012-03-24 DIAGNOSIS — J309 Allergic rhinitis, unspecified: Secondary | ICD-10-CM

## 2012-03-24 DIAGNOSIS — N952 Postmenopausal atrophic vaginitis: Secondary | ICD-10-CM

## 2012-03-24 DIAGNOSIS — R3129 Other microscopic hematuria: Secondary | ICD-10-CM

## 2012-03-24 DIAGNOSIS — R739 Hyperglycemia, unspecified: Secondary | ICD-10-CM

## 2012-03-24 LAB — MICROALBUMIN / CREATININE URINE RATIO
Creatinine,U: 137.4 mg/dL
Microalb Creat Ratio: 1.3 mg/g (ref 0.0–30.0)
Microalb, Ur: 1.8 mg/dL (ref 0.0–1.9)

## 2012-03-24 LAB — POCT URINALYSIS DIPSTICK
Bilirubin, UA: NEGATIVE
Ketones, UA: NEGATIVE
Leukocytes, UA: NEGATIVE
Protein, UA: NEGATIVE
Spec Grav, UA: 1.02

## 2012-03-24 LAB — BASIC METABOLIC PANEL
Chloride: 97 mEq/L (ref 96–112)
Creatinine, Ser: 0.8 mg/dL (ref 0.4–1.2)
Sodium: 138 mEq/L (ref 135–145)

## 2012-03-24 LAB — HEMOGLOBIN A1C: Hgb A1c MFr Bld: 7.6 % — ABNORMAL HIGH (ref 4.6–6.5)

## 2012-03-24 MED ORDER — ESTROGENS, CONJUGATED 0.625 MG/GM VA CREA: 0.6250 g | TOPICAL_CREAM | Freq: Every day | VAGINAL | Status: DC

## 2012-03-24 MED ORDER — FLUTICASONE PROPIONATE 50 MCG/ACT NA SUSP: 2.0000 | Freq: Every day | NASAL | Status: DC

## 2012-03-24 MED ORDER — ATENOLOL-CHLORTHALIDONE 50-25 MG PO TABS: 1.0000 | ORAL_TABLET | Freq: Every day | ORAL | Status: DC

## 2012-03-24 NOTE — Patient Instructions (Signed)
Continue your current medications  Continue to walk daily  Followup in 1 year sooner if any problems

## 2012-03-24 NOTE — Progress Notes (Signed)
  Subjective:    Patient ID: Erin Hatfield, female    DOB: [birth year], 61 y.o.   MRN: [id number]  HPI Erin Hatfield is a delightful 61 year old married female nonsmoker who comes in today for general physical examination because of an underlying history of hypertension allergic rhinitis and post menopausal vaginal dryness  Her medications reviewed there've been no changes. She states she feels well and has no complaints.  She gets routine eye care, dental care, BSE monthly, and you mammography, recent colonoscopy normal, tetanus 2007, shingles 2012, seasonal flu shot today.  She is retired however she walks on a daily basis and helps care for her 45-year-old grandson who actually lives out of state but they travel back-and-forth.   Review of Systems  Constitutional: Negative.   HENT: Negative.   Eyes: Negative.   Respiratory: Negative.   Cardiovascular: Negative.   Gastrointestinal: Negative.   Genitourinary: Negative.   Musculoskeletal: Negative.   Neurological: Negative.   Hematological: Negative.   Psychiatric/Behavioral: Negative.        Objective:   Physical Exam  Constitutional: She appears well-developed and well-nourished.  HENT:  Head: Normocephalic and atraumatic.  Right Ear: External ear normal.  Left Ear: External ear normal.  Nose: Nose normal.  Mouth/Throat: Oropharynx is clear and moist.  Eyes: EOM are normal. Pupils are equal, round, and reactive to light.  Neck: Normal range of motion. Neck supple. No thyromegaly present.  Cardiovascular: Normal rate, regular rhythm, normal heart sounds and intact distal pulses.  Exam reveals no gallop and no friction rub.   No murmur heard. Pulmonary/Chest: Effort normal and breath sounds normal.  Abdominal: Soft. Bowel sounds are normal. She exhibits no distension and no mass. There is no tenderness. There is no rebound.  Genitourinary: Vagina normal. Guaiac negative stool. No vaginal discharge found.       Uterus has been  surgically removed vaginal vault normal bimanual exam negative  Bilateral breast exam normal  Musculoskeletal: Normal range of motion.  Lymphadenopathy:    She has no cervical adenopathy.  Neurological: She is alert. She has normal reflexes. No cranial nerve deficit. She exhibits normal muscle tone. Coordination normal.  Skin: Skin is warm and dry.  Psychiatric: She has a normal mood and affect. Her behavior is normal. Judgment and thought content normal.          Assessment & Plan:  Healthy female  Hypertension continue current medication  Allergic rhinitis continue current medication  Postmenopausal vaginal dryness continue hormonal cream twice weekly  Abnormal blood sugar recheck blood sugar and A1c  Asymptomatic hematuria,,,,,,,,,,,, recheck UA ,,,,,,,,,,,,

## 2012-03-28 ENCOUNTER — Other Ambulatory Visit: Payer: Self-pay | Admitting: Family Medicine

## 2012-03-28 MED ORDER — METFORMIN HCL 500 MG PO TABS: ORAL_TABLET | ORAL | Status: DC

## 2012-03-31 ENCOUNTER — Telehealth: Payer: Self-pay | Admitting: Family Medicine

## 2012-03-31 MED ORDER — GLUCOSE BLOOD VI STRP: 1.0000 | ORAL_STRIP | Freq: Every day | Status: DC

## 2012-03-31 NOTE — Telephone Encounter (Signed)
Pt called and said that she needs rx for One Touch Ultra Mini test strips. Pt is req a call back from [place], before calling in the script to a pharmacy.

## 2012-04-03 ENCOUNTER — Telehealth: Payer: Self-pay | Admitting: Family Medicine

## 2012-04-03 NOTE — Telephone Encounter (Signed)
Patient Information:  Caller Name: Pieper  Phone: [phone]  Patient: Erin Hatfield, Erin Hatfield  Gender: Female  DOB: 09-01-1950  Age: 61 Years  PCP: Kelle Darting [facility])   Symptoms  Reason For Call & Symptoms: FBS have not decreased since starting 1/2 tab Metformin on 11/16; continues to have levels ranging 164-180mg   Reviewed Health History In EMR: Yes  Reviewed Medications In EMR: Yes  Reviewed Allergies In EMR: Yes  Date of Onset of Symptoms: 03/29/2012  Treatments Tried: Metformin 1/2 tab daily since 11/16  Treatments Tried Worked: No  Guideline(s) Used:  No Protocol Available - Information Only  Disposition Per Guideline:   Discuss with PCP and Callback by Nurse Today  Reason For Disposition Reached:   Nursing judgment  Advice Given:  Call Back If:  New symptoms develop  You become worse.  Office Follow Up:  Does the office need to follow up with this patient?: Yes  Instructions For The Office: Effectiveness of the Metformin 1/2 tab daily  RN Note:  Continues to walk 30 minutes daily.  Denies any sx of any illness.  She is going OOT this weekend for a week and just wants to make sure that she is doing everything she needs too.

## 2012-04-04 NOTE — Telephone Encounter (Signed)
Patient should take half tab twice daily. Left message on machine for patient

## 2012-04-04 NOTE — Telephone Encounter (Signed)
Patient came in with glucose readings 216, 199, 164, 174, 173, 180, 179.  She is only taking half of a Metformin 500 mg.  I advised her to take one tab daily, record her glucose readings and follow up with an office visit. FYI.

## 2012-04-15 ENCOUNTER — Encounter: Payer: Self-pay | Admitting: Family Medicine

## 2012-04-15 ENCOUNTER — Ambulatory Visit (INDEPENDENT_AMBULATORY_CARE_PROVIDER_SITE_OTHER): Payer: BC Managed Care – PPO | Admitting: Family Medicine

## 2012-04-15 VITALS — BP 120/80 | Temp 98.0°F | Wt 175.0 lb

## 2012-04-15 DIAGNOSIS — E1165 Type 2 diabetes mellitus with hyperglycemia: Secondary | ICD-10-CM

## 2012-04-15 MED ORDER — ONETOUCH LANCETS MISC: 1.0000 | Freq: Every day | Status: DC

## 2012-04-15 NOTE — Progress Notes (Signed)
  Subjective:    Patient ID: Erin Hatfield, female    DOB: 1951/04/14, 61 y.o.   MRN: [id number]  HPI  Erin Hatfield is a delightful 61 year old female who comes in today for evaluation of new-onset diabetes  She recently had her blood sugar checked and it was elevated and her A1c was 7.6%. We therefore started on metformin 250 mg daily and she's increased that to 250 mg twice daily blood sugars have dropped in the 140 range. She's been compliant with her diet and exercise.  Positive family history of diabetes  Review of Systems      general and metabolic review of systems otherwise negative blood pressure normal 120/80 Objective:   Physical Exam Well-developed well-nourished female no acute distress vital signs stable       Assessment & Plan:  Diabetes type 2 increase metformin slowly to 50 mg every 2 weeks until blood sugar drops 200 followup in 2 weeks

## 2012-04-15 NOTE — Patient Instructions (Signed)
Continue the metformin 250 mg twice daily for a total of 2 weeks  Fasting blood sugar daily in the morning  Goal 100  If in one week your blood sugar is not at goal increase the metformin take 500 mg prior to breakfast and continue the 250 mg dose prior to your evening female  Return in one month for followup

## 2012-04-30 ENCOUNTER — Telehealth: Payer: Self-pay | Admitting: Family Medicine

## 2012-04-30 MED ORDER — METFORMIN HCL 500 MG PO TABS: 500.0000 mg | ORAL_TABLET | Freq: Two times a day (BID) | ORAL | Status: DC

## 2012-04-30 NOTE — Telephone Encounter (Signed)
Pt states pharmacy will not refill metformin since the dosage was changed at her last visit but the quantity was not enough to last. Patient is out of medication and needs new rx with correct dosage instructions sent to [facility]  (pt ask if you would call her if clarification is needed).

## 2012-04-30 NOTE — Telephone Encounter (Signed)
Spoke with patient and Rx sent to pharmacy.   

## 2012-05-19 ENCOUNTER — Encounter: Payer: Self-pay | Admitting: Family Medicine

## 2012-05-19 ENCOUNTER — Ambulatory Visit (INDEPENDENT_AMBULATORY_CARE_PROVIDER_SITE_OTHER): Payer: BC Managed Care – PPO | Admitting: Family Medicine

## 2012-05-19 VITALS — BP 110/80 | Temp 98.6°F | Wt 171.0 lb

## 2012-05-19 DIAGNOSIS — E1165 Type 2 diabetes mellitus with hyperglycemia: Secondary | ICD-10-CM

## 2012-05-19 DIAGNOSIS — IMO0002 Reserved for concepts with insufficient information to code with codable children: Secondary | ICD-10-CM

## 2012-05-19 NOTE — Progress Notes (Signed)
  Subjective:    Patient ID: Erin Hatfield, female    DOB: Sep 11, 1950, 62 y.o.   MRN: [id number]  HPI Erin Hatfield is a 62 year old female who comes in today for followup of diabetes  She's currently on metformin 500 mg twice a day her weight is down 7 pounds, blood sugar in the 120 range, and she feels well. She's walking on a regular basis.  Blood pressure normal 110/80 on Tenoretic    Review of Systems    general and metabolic review of systems otherwise negative Objective:   Physical Exam  Well-developed well-nourished female no acute distress      Assessment & Plan:  Diabetes type 2 at goal plan continue current therapy followup A1c in 2 months

## 2012-05-19 NOTE — Patient Instructions (Signed)
Continue your current treatment program  Followup in March  Nonfasting labs one week prior

## 2012-07-10 ENCOUNTER — Other Ambulatory Visit: Payer: Self-pay | Admitting: *Deleted

## 2012-07-10 ENCOUNTER — Other Ambulatory Visit (INDEPENDENT_AMBULATORY_CARE_PROVIDER_SITE_OTHER): Payer: BC Managed Care – PPO

## 2012-07-10 DIAGNOSIS — E1165 Type 2 diabetes mellitus with hyperglycemia: Secondary | ICD-10-CM

## 2012-07-10 LAB — BASIC METABOLIC PANEL
CO2: 30 mEq/L (ref 19–32)
Calcium: 9.7 mg/dL (ref 8.4–10.5)
GFR: 89.14 mL/min (ref >60.00)
Potassium: 2.9 mEq/L — ABNORMAL LOW (ref 3.5–5.1)
Sodium: 139 mEq/L (ref 135–145)

## 2012-07-10 LAB — HEMOGLOBIN A1C: Hgb A1c MFr Bld: 6.4 % (ref 4.6–6.5)

## 2012-07-10 MED ORDER — ATENOLOL 50 MG PO TABS: 50.0000 mg | ORAL_TABLET | Freq: Every day | ORAL | Status: DC

## 2012-07-17 ENCOUNTER — Ambulatory Visit: Payer: BC Managed Care – PPO | Admitting: Family Medicine

## 2012-07-28 ENCOUNTER — Other Ambulatory Visit: Payer: Self-pay

## 2012-07-28 ENCOUNTER — Telehealth: Payer: Self-pay | Admitting: Family Medicine

## 2012-07-28 DIAGNOSIS — Z1231 Encounter for screening mammogram for malignant neoplasm of breast: Secondary | ICD-10-CM

## 2012-07-28 NOTE — Telephone Encounter (Signed)
Spoke with patient and she will call back and schedule a DM follow up with a potassium check.

## 2012-07-28 NOTE — Telephone Encounter (Signed)
Pt was told by you to cancel March 6 appt. Pt would like to know if you need her to rescedule?  Pt said her a1c (6.5)  is down, but her sugar is good. (new diabetic). Pls advise.

## 2012-08-15 ENCOUNTER — Ambulatory Visit
Admission: RE | Admit: 2012-08-15 | Discharge: 2012-08-15 | Disposition: A | Discharge status: Home / Self Care | Payer: BC Managed Care – PPO | Source: Ambulatory Visit

## 2012-08-15 DIAGNOSIS — Z1231 Encounter for screening mammogram for malignant neoplasm of breast: Secondary | ICD-10-CM

## 2012-12-16 ENCOUNTER — Other Ambulatory Visit (INDEPENDENT_AMBULATORY_CARE_PROVIDER_SITE_OTHER): Payer: BC Managed Care – PPO

## 2012-12-16 DIAGNOSIS — I10 Essential (primary) hypertension: Secondary | ICD-10-CM

## 2012-12-16 LAB — BASIC METABOLIC PANEL
BUN: 16 mg/dL (ref 6–23)
GFR: 89.02 mL/min (ref >60.00)
Potassium: 4.1 mEq/L (ref 3.5–5.1)
Sodium: 140 mEq/L (ref 135–145)

## 2012-12-23 ENCOUNTER — Encounter: Payer: Self-pay | Admitting: Family

## 2012-12-23 ENCOUNTER — Ambulatory Visit (INDEPENDENT_AMBULATORY_CARE_PROVIDER_SITE_OTHER): Payer: BC Managed Care – PPO | Admitting: Family

## 2012-12-23 ENCOUNTER — Ambulatory Visit: Payer: BC Managed Care – PPO | Admitting: Family Medicine

## 2012-12-23 VITALS — BP 136/90 | HR 81 | Wt 169.0 lb

## 2012-12-23 DIAGNOSIS — E876 Hypokalemia: Secondary | ICD-10-CM

## 2012-12-23 DIAGNOSIS — J309 Allergic rhinitis, unspecified: Secondary | ICD-10-CM

## 2012-12-23 DIAGNOSIS — I10 Essential (primary) hypertension: Secondary | ICD-10-CM

## 2012-12-23 DIAGNOSIS — E119 Type 2 diabetes mellitus without complications: Secondary | ICD-10-CM

## 2012-12-23 LAB — HEPATIC FUNCTION PANEL
AST: 19 U/L (ref 0–37)
Total Bilirubin: 0.7 mg/dL (ref 0.3–1.2)

## 2012-12-23 LAB — CBC WITH DIFFERENTIAL/PLATELET
Basophils Absolute: 0 10*3/uL (ref 0.0–0.1)
Eosinophils Absolute: 0.1 10*3/uL (ref 0.0–0.7)
Lymphocytes Relative: 50.8 % — ABNORMAL HIGH (ref 12.0–46.0)
MCHC: 31.8 g/dL (ref 30.0–36.0)
Neutrophils Relative %: 38.5 % — ABNORMAL LOW (ref 43.0–77.0)
Platelets: 348 10*3/uL (ref 150.0–400.0)
RDW: 12.9 % (ref 11.5–14.6)

## 2012-12-23 LAB — HEMOGLOBIN A1C: Hgb A1c MFr Bld: 5.8 % (ref 4.6–6.5)

## 2012-12-23 MED ORDER — METFORMIN HCL 500 MG PO TABS: 500.0000 mg | ORAL_TABLET | Freq: Two times a day (BID) | ORAL | Status: DC

## 2012-12-23 MED ORDER — GLUCOSE BLOOD VI STRP: 1.0000 | ORAL_STRIP | Freq: Every day | Status: DC

## 2012-12-23 NOTE — Progress Notes (Signed)
Subjective:    Patient ID: Erin Hatfield, female    DOB: 1950/07/26, 62 y.o.   MRN: [id number]  HPI 62 year old [place] American female, nonsmoker, patient of Dr. Tawanna Cooler is in today for recheck of type 2 diabetes. She's currently taking metformin 500 mg twice a day and tolerating it well. Her last hemoglobin A1c was 6.45 months ago. She is exercising 5 days a week. Blood glucose this morning was 102. She also has a history of hypertension that is stable. Denies any lightheadedness, dizziness, chest pain, palpitation, shortness of breath or edema. Patient was also found to be hypokalemic at her last office visit Tenoretic was stopped and she was put on Tenormin.   Review of Systems  Constitutional: Negative.   HENT: Negative.   Respiratory: Negative.   Cardiovascular: Negative.   Gastrointestinal: Negative.   Endocrine: Negative.  Negative for polydipsia, polyphagia and polyuria.  Musculoskeletal: Negative.   Skin: Negative.   Allergic/Immunologic: Negative.   Neurological: Negative.   Psychiatric/Behavioral: Negative.    Past Medical History  Diagnosis Date  . Hypertension   . Bunion, left foot   . Allergy   . Hot flashes     History   Social History  . Marital Status: Married    Spouse Name: N/A    Number of Children: N/A  . Years of Education: N/A   Occupational History  . Not on file.   Social History Main Topics  . Smoking status: Never Smoker   . Smokeless tobacco: Not on file  . Alcohol Use:   . Drug Use:   . Sexually Active:    Other Topics Concern  . Not on file   Social History Narrative  . No narrative on file    Past Surgical History  Procedure Laterality Date  . Abdominal hysterectomy      Family History  Problem Relation Age of Onset  . Diabetes Other   . Hypertension Other     No Known Allergies  Current Outpatient Prescriptions on File Prior to Visit  Medication Sig Dispense Refill  . aspirin 81 MG tablet Take 81 mg by mouth daily.         Marland Kitchen atenolol (TENORMIN) 50 MG tablet Take 1 tablet (50 mg total) by mouth daily.  100 tablet  3  . cetirizine (ZYRTEC) 10 MG tablet Take 10 mg by mouth daily.        . cholecalciferol (VITAMIN D) 1000 UNITS tablet Take 1,000 Units by mouth daily.        Marland Kitchen conjugated estrogens (PREMARIN) vaginal cream Place 0.25 Applicatorfuls vaginally daily.  42.5 g  11  . fish oil-omega-3 fatty acids 1000 MG capsule Take 1 g by mouth daily.        . fluticasone (FLONASE) 50 MCG/ACT nasal spray Place 2 sprays into the nose daily.  16 g  11  . ONE TOUCH LANCETS MISC 1 each by Does not apply route daily.  200 each  3   No current facility-administered medications on file prior to visit.    BP 136/90  Pulse 81  Wt 169 lb (76.658 kg)  BMI 29.94 kg/m2  SpO2 97%chart    Objective:   Physical Exam  Constitutional: She is oriented to person, place, and time. She appears well-developed and well-nourished.  HENT:  Right Ear: External ear normal.  Left Ear: External ear normal.  Nose: Nose normal.  Mouth/Throat: Oropharynx is clear and moist.  Eyes: Conjunctivae are normal. Pupils are equal, round, and reactive  to light.  Neck: Normal range of motion. Neck supple. No thyromegaly present.  Cardiovascular: Normal rate, regular rhythm and normal heart sounds.   Pulmonary/Chest: Breath sounds normal.  Abdominal: Soft. Bowel sounds are normal.  Musculoskeletal: Normal range of motion.  Neurological: She is alert and oriented to person, place, and time. She has normal reflexes.  Skin: Skin is warm and dry.  Psychiatric: She has a normal mood and affect.          Assessment & Plan:  Assessment: 1. Type 2 diabetes 2. Hypertension 3. Allergic rhinitis 4. Hypokalemia  Plan: Sent A1c, LFT Will notify patient of results. Recheck in 6 months and sooner as needed. Continue exercise.

## 2013-04-08 ENCOUNTER — Other Ambulatory Visit (INDEPENDENT_AMBULATORY_CARE_PROVIDER_SITE_OTHER): Payer: BC Managed Care – PPO

## 2013-04-08 DIAGNOSIS — Z0000 Encounter for general adult medical examination without abnormal findings: Secondary | ICD-10-CM

## 2013-04-08 LAB — BASIC METABOLIC PANEL
Chloride: 103 mEq/L (ref 96–112)
Creatinine, Ser: 0.8 mg/dL (ref 0.4–1.2)
Potassium: 3.6 mEq/L (ref 3.5–5.1)

## 2013-04-08 LAB — HEPATIC FUNCTION PANEL
ALT: 19 U/L (ref 0–35)
Bilirubin, Direct: 0 mg/dL (ref 0.0–0.3)
Total Protein: 7.5 g/dL (ref 6.0–8.3)

## 2013-04-08 LAB — CBC WITH DIFFERENTIAL/PLATELET
Basophils Relative: 0.8 % (ref 0.0–3.0)
Eosinophils Relative: 1.7 % (ref 0.0–5.0)
HCT: 39.1 % (ref 36.0–46.0)
MCV: 84.8 fl (ref 78.0–100.0)
Monocytes Absolute: 0.6 10*3/uL (ref 0.1–1.0)
Neutrophils Relative %: 42.2 % — ABNORMAL LOW (ref 43.0–77.0)
RBC: 4.61 Mil/uL (ref 3.87–5.11)
WBC: 7.4 10*3/uL (ref 4.5–10.5)

## 2013-04-08 LAB — LIPID PANEL: Total CHOL/HDL Ratio: 5

## 2013-04-08 LAB — POCT URINALYSIS DIPSTICK
Bilirubin, UA: NEGATIVE
Nitrite, UA: NEGATIVE
Protein, UA: NEGATIVE
Urobilinogen, UA: 0.2

## 2013-04-08 LAB — TSH: TSH: 4.65 u[IU]/mL (ref 0.35–5.50)

## 2013-04-08 LAB — MICROALBUMIN / CREATININE URINE RATIO
Creatinine,U: 128.6 mg/dL
Microalb, Ur: 0.2 mg/dL (ref 0.0–1.9)

## 2013-04-08 LAB — LDL CHOLESTEROL, DIRECT: Direct LDL: 119.5 mg/dL

## 2013-04-16 ENCOUNTER — Encounter: Payer: Self-pay | Admitting: Family Medicine

## 2013-04-16 ENCOUNTER — Ambulatory Visit (INDEPENDENT_AMBULATORY_CARE_PROVIDER_SITE_OTHER): Payer: BC Managed Care – PPO | Admitting: Family Medicine

## 2013-04-16 VITALS — BP 120/80 | Temp 97.8°F | Wt 176.0 lb

## 2013-04-16 DIAGNOSIS — N952 Postmenopausal atrophic vaginitis: Secondary | ICD-10-CM

## 2013-04-16 DIAGNOSIS — Z0000 Encounter for general adult medical examination without abnormal findings: Secondary | ICD-10-CM

## 2013-04-16 DIAGNOSIS — Z23 Encounter for immunization: Secondary | ICD-10-CM

## 2013-04-16 DIAGNOSIS — E1165 Type 2 diabetes mellitus with hyperglycemia: Secondary | ICD-10-CM

## 2013-04-16 DIAGNOSIS — N951 Menopausal and female climacteric states: Secondary | ICD-10-CM

## 2013-04-16 DIAGNOSIS — R3129 Other microscopic hematuria: Secondary | ICD-10-CM

## 2013-04-16 DIAGNOSIS — I10 Essential (primary) hypertension: Secondary | ICD-10-CM

## 2013-04-16 MED ORDER — METFORMIN HCL 500 MG PO TABS: 500.0000 mg | ORAL_TABLET | Freq: Two times a day (BID) | ORAL | Status: DC

## 2013-04-16 MED ORDER — ESTROGENS, CONJUGATED 0.625 MG/GM VA CREA: 0.6250 g | TOPICAL_CREAM | Freq: Every day | VAGINAL | Status: DC

## 2013-04-16 MED ORDER — ATENOLOL 50 MG PO TABS: 50.0000 mg | ORAL_TABLET | Freq: Every day | ORAL | Status: DC

## 2013-04-16 NOTE — Progress Notes (Signed)
Pre visit review using our clinic review tool, if applicable. No additional management support is needed unless otherwise documented below in the visit note. 

## 2013-04-16 NOTE — Patient Instructions (Signed)
Continue your current medications. I would use the hormonal cream twice weekly  Continue daily walking  Check a blood sugar weekly since your blood sugar is such under excellent control  Return in 6 months for followup on your diabetes and A1c one week prior

## 2013-04-16 NOTE — Progress Notes (Signed)
   Subjective:    Patient ID: Erin Hatfield, female    DOB: 16-Apr-1951, 62 y.o.   MRN: [id number]  HPI Erin Hatfield is a 62 year old female nonsmoker who comes in today for annual evaluation of hypertension, allergic rhinitis, post menopausal vaginal dryness, and diabetes type 2  Her medication reviewed the been no changes except she's not using the steroid nasal spray. 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   HENT: Negative.   Eyes: Negative.   Respiratory: Negative.   Cardiovascular: Negative.   Gastrointestinal: Negative.   Endocrine: Negative.   Genitourinary: Negative.   Musculoskeletal: Negative.   Allergic/Immunologic: Negative.   Neurological: Negative.   Hematological: Negative.   Psychiatric/Behavioral: Negative.        Objective:   Physical Exam  Nursing note and vitals reviewed. Constitutional: She appears well-developed and well-nourished.  HENT:  Head: Normocephalic and atraumatic.  Right Ear: External ear normal.  Left Ear: External ear normal.  Nose: Nose normal.  Mouth/Throat: Oropharynx is clear and moist.  Eyes: EOM are normal. Pupils are equal, round, and reactive to light.  Neck: Normal range of motion. Neck supple. No thyromegaly present.  Cardiovascular: Normal rate, regular rhythm, normal heart sounds and intact distal pulses.  Exam reveals no gallop and no friction rub.   No murmur heard. No carotid or bruits peripheral pulses 2+ and symmetrical  Pulmonary/Chest: Effort normal and breath sounds normal.  Abdominal: Soft. Bowel sounds are normal. She exhibits no distension and no mass. There is no tenderness. There is no rebound.  Genitourinary: Vagina normal.  Guaiac negative stool. No vaginal discharge found.  Bilateral breast exam normal uterus has been surgically removed  Musculoskeletal: Normal range of motion.  Lymphadenopathy:    She has no cervical adenopathy.  Neurological: She is alert. She has normal reflexes. No cranial nerve deficit. She exhibits normal muscle tone. Coordination normal.  Skin: Skin is warm and dry.  Psychiatric: She has a normal mood and affect. Her behavior is normal. Judgment and thought content normal.          Assessment & Plan:  Healthy female  Hypertension at goal continue Tenormin 50 mg daily  Diabetes type 2 on metformin 500 mg twice a day with fasting blood sugar 1:15 hemoglobin A1c 5.9%  Allergic rhinitis continue OTC Zyrtec  Postmenopausal vaginal dryness continue Premarin vaginal cream

## 2013-07-13 ENCOUNTER — Other Ambulatory Visit: Payer: Self-pay

## 2013-07-13 DIAGNOSIS — Z1231 Encounter for screening mammogram for malignant neoplasm of breast: Secondary | ICD-10-CM

## 2013-08-17 ENCOUNTER — Ambulatory Visit: Payer: BC Managed Care – PPO

## 2013-08-24 ENCOUNTER — Ambulatory Visit
Admission: RE | Admit: 2013-08-24 | Discharge: 2013-08-24 | Disposition: A | Discharge status: Home / Self Care | Payer: BC Managed Care – PPO | Source: Ambulatory Visit

## 2013-08-24 DIAGNOSIS — Z1231 Encounter for screening mammogram for malignant neoplasm of breast: Secondary | ICD-10-CM

## 2013-10-06 ENCOUNTER — Other Ambulatory Visit (INDEPENDENT_AMBULATORY_CARE_PROVIDER_SITE_OTHER): Payer: BC Managed Care – PPO

## 2013-10-06 DIAGNOSIS — E1165 Type 2 diabetes mellitus with hyperglycemia: Secondary | ICD-10-CM

## 2013-10-06 DIAGNOSIS — IMO0001 Reserved for inherently not codable concepts without codable children: Secondary | ICD-10-CM

## 2013-10-06 DIAGNOSIS — IMO0002 Reserved for concepts with insufficient information to code with codable children: Secondary | ICD-10-CM

## 2013-10-06 LAB — BASIC METABOLIC PANEL
BUN: 13 mg/dL (ref 6–23)
CHLORIDE: 105 meq/L (ref 96–112)
CO2: 29 meq/L (ref 19–32)
CREATININE: 0.9 mg/dL (ref 0.4–1.2)
Calcium: 9.6 mg/dL (ref 8.4–10.5)
GFR: 83 mL/min (ref >60.00)
Glucose, Bld: 113 mg/dL — ABNORMAL HIGH (ref 70–99)
POTASSIUM: 3.8 meq/L (ref 3.5–5.1)
Sodium: 141 mEq/L (ref 135–145)

## 2013-10-06 LAB — HEMOGLOBIN A1C: Hgb A1c MFr Bld: 5.9 % (ref 4.6–6.5)

## 2013-10-12 ENCOUNTER — Encounter: Payer: Self-pay | Admitting: Family Medicine

## 2013-10-12 ENCOUNTER — Ambulatory Visit (INDEPENDENT_AMBULATORY_CARE_PROVIDER_SITE_OTHER): Payer: BC Managed Care – PPO | Admitting: Family Medicine

## 2013-10-12 VITALS — BP 120/84 | Temp 98.3°F | Wt 175.0 lb

## 2013-10-12 DIAGNOSIS — E1165 Type 2 diabetes mellitus with hyperglycemia: Secondary | ICD-10-CM

## 2013-10-12 DIAGNOSIS — IMO0002 Reserved for concepts with insufficient information to code with codable children: Secondary | ICD-10-CM

## 2013-10-12 DIAGNOSIS — IMO0001 Reserved for inherently not codable concepts without codable children: Secondary | ICD-10-CM

## 2013-10-12 NOTE — Progress Notes (Signed)
   Subjective:    Patient ID: Erin Hatfield, female    DOB: 09-18-1950, 63 y.o.   MRN: [id number]  HPI Erin Hatfield is a 63 year old female who comes in today for followup of diabetes  We did her physical examination in November her A1c at that time was 5.9%. Today it's also 5.9% fasting blood sugar 113. No hypoglycemic episodes   Review of Systems Review of systems negative except she's due for colonoscopy. Last colonoscopy was 10 years ago in [facility]    Objective:   Physical Exam  Well-developed well-nourished female no acute distress vital signs stable she is afebrile      Assessment & Plan:  Diabetes type 2 at goal return the last week in November for annual checkup  Screening colonoscopy requested

## 2013-10-12 NOTE — Patient Instructions (Signed)
Continue current medications  Set up a time the last week in November for your annual exam  Labs one week prior

## 2013-10-12 NOTE — Progress Notes (Signed)
Pre visit review using our clinic review tool, if applicable. No additional management support is needed unless otherwise documented below in the visit note. 

## 2013-11-17 ENCOUNTER — Encounter: Payer: Self-pay | Admitting: Family Medicine

## 2013-12-15 ENCOUNTER — Telehealth: Payer: Self-pay

## 2013-12-15 NOTE — Telephone Encounter (Signed)
Rec'd from [facility] forward 8 pages to GI Historical Provider

## 2013-12-17 ENCOUNTER — Telehealth: Payer: Self-pay

## 2013-12-17 NOTE — Telephone Encounter (Signed)
Rec'd from patient forward 4 pages to Amy on the GI Floor

## 2013-12-23 ENCOUNTER — Telehealth: Payer: Self-pay | Admitting: Family Medicine

## 2013-12-23 MED ORDER — HYDROCODONE-HOMATROPINE 5-1.5 MG/5ML PO SYRP: 5.0000 mL | ORAL_SOLUTION | Freq: Three times a day (TID) | ORAL | Status: DC | PRN

## 2013-12-23 NOTE — Telephone Encounter (Signed)
Per Dr Sherren Mocha, patient should try Hydromet, Tylenol, liquids, and afrin. Patient is aware.

## 2013-12-23 NOTE — Telephone Encounter (Signed)
Pt was wanting to get an appt today, states coughing, fever, and side of her neck is sore, some cold chills, headache, pt wanted to know if dr. Sherren Mocha could work her in or just call something in for her pt feels it may be a sinus infection.

## 2014-01-04 ENCOUNTER — Other Ambulatory Visit: Payer: Self-pay | Admitting: Family

## 2014-01-06 ENCOUNTER — Ambulatory Visit (INDEPENDENT_AMBULATORY_CARE_PROVIDER_SITE_OTHER): Payer: Medicare HMO | Admitting: Family Medicine

## 2014-01-06 ENCOUNTER — Encounter: Payer: Self-pay | Admitting: Family Medicine

## 2014-01-06 VITALS — BP 130/90 | Temp 98.6°F | Wt 175.0 lb

## 2014-01-06 DIAGNOSIS — IMO0001 Reserved for inherently not codable concepts without codable children: Secondary | ICD-10-CM

## 2014-01-06 DIAGNOSIS — E1142 Type 2 diabetes mellitus with diabetic polyneuropathy: Secondary | ICD-10-CM

## 2014-01-06 DIAGNOSIS — E1140 Type 2 diabetes mellitus with diabetic neuropathy, unspecified: Secondary | ICD-10-CM | POA: Insufficient documentation

## 2014-01-06 DIAGNOSIS — E1149 Type 2 diabetes mellitus with other diabetic neurological complication: Secondary | ICD-10-CM

## 2014-01-06 DIAGNOSIS — IMO0002 Reserved for concepts with insufficient information to code with codable children: Secondary | ICD-10-CM

## 2014-01-06 DIAGNOSIS — E1165 Type 2 diabetes mellitus with hyperglycemia: Secondary | ICD-10-CM

## 2014-01-06 DIAGNOSIS — Z23 Encounter for immunization: Secondary | ICD-10-CM

## 2014-01-06 MED ORDER — AMITRIPTYLINE HCL 25 MG PO TABS: 25.0000 mg | ORAL_TABLET | Freq: Every day | ORAL | Status: DC

## 2014-01-06 NOTE — Progress Notes (Signed)
   Subjective:    Patient ID: Erin Hatfield, female    DOB: Jan 30, 1951, 63 y.o.   MRN: [id number]  HPI Erin Hatfield is a 63 year old female nonsmoker who comes in today for evaluation of a sensation on the right side of her calf for about a month  She notices when she stands she has some tingling sensation on the right side of her calf it seems to go down to her ankle. It lasts for about 5 minutes and goes away. It is decreased by walking. He usually is triggered by standing. It doesn't bother her at night.  Her blood sugars been under good control last A1c under 7.0%.   Review of Systems Review of systems negative no neurologic symptoms no back pain etc.    Objective:   Physical Exam  Well-developed well-nourished female no acute distress vital signs stable she is afebrile examination lower extremity shows the skin to be normal pulses are normal sensation normal no palpable tenderness      Assessment & Plan:  Symptoms of diabetic neuropathy........... trial of Elavil

## 2014-01-06 NOTE — Patient Instructions (Signed)
Elavil 25 mg........... one tablet at bedtime  Followup in 6 weeks

## 2014-01-06 NOTE — Progress Notes (Signed)
Pre visit review using our clinic review tool, if applicable. No additional management support is needed unless otherwise documented below in the visit note. 

## 2014-02-23 ENCOUNTER — Ambulatory Visit (INDEPENDENT_AMBULATORY_CARE_PROVIDER_SITE_OTHER): Payer: Medicare HMO | Admitting: Family Medicine

## 2014-02-23 ENCOUNTER — Encounter: Payer: Self-pay | Admitting: Family Medicine

## 2014-02-23 VITALS — BP 124/88 | Temp 98.0°F | Wt 175.0 lb

## 2014-02-23 DIAGNOSIS — E1140 Type 2 diabetes mellitus with diabetic neuropathy, unspecified: Secondary | ICD-10-CM

## 2014-02-23 NOTE — Progress Notes (Signed)
Pre visit review using our clinic review tool, if applicable. No additional management support is needed unless otherwise documented below in the visit note. 

## 2014-02-23 NOTE — Progress Notes (Signed)
   Subjective:    Patient ID: Erin Hatfield, female    DOB: [birth year], 63 y.o.   MRN: [id number]  HPI Erin Hatfield is a 63 year old female who comes in today for followup of her diabetic neuropathy  Week saw her in August her A1c was below 7 but she developed a diabetic neuropathy. We started on Elavil 25 mg daily at bedtime if comes back today for followup. She states on the Elavil she sleeps better she does have a somewhat of a dry mouth and the neuropathy is about 50% diminished with 25 mg daily   Review of Systems Review of systems otherwise negative    Objective:   Physical Exam  Well-developed well-nourished female no acute distress vital signs stable she is afebrile      Assessment & Plan:  Diabetic neuropathy improved with Elavil 25 mg Q. at bedtime........ continue that dose followup CPX in December

## 2014-04-13 ENCOUNTER — Other Ambulatory Visit (INDEPENDENT_AMBULATORY_CARE_PROVIDER_SITE_OTHER): Payer: Medicare HMO

## 2014-04-13 DIAGNOSIS — E1165 Type 2 diabetes mellitus with hyperglycemia: Secondary | ICD-10-CM

## 2014-04-13 DIAGNOSIS — IMO0002 Reserved for concepts with insufficient information to code with codable children: Secondary | ICD-10-CM

## 2014-04-13 LAB — TSH: TSH: 2.41 u[IU]/mL (ref 0.35–4.50)

## 2014-04-13 LAB — POCT URINALYSIS DIPSTICK
Bilirubin, UA: NEGATIVE
Glucose, UA: NEGATIVE
KETONES UA: NEGATIVE
NITRITE UA: NEGATIVE
PROTEIN UA: NEGATIVE
Spec Grav, UA: 1.015
Urobilinogen, UA: 0.2
pH, UA: 5.5

## 2014-04-13 LAB — CBC WITH DIFFERENTIAL/PLATELET
Basophils Absolute: 0.1 10*3/uL (ref 0.0–0.1)
Basophils Relative: 0.7 % (ref 0.0–3.0)
Eosinophils Absolute: 0.1 10*3/uL (ref 0.0–0.7)
Eosinophils Relative: 2.1 % (ref 0.0–5.0)
HCT: 38.5 % (ref 36.0–46.0)
HEMOGLOBIN: 12.2 g/dL (ref 12.0–15.0)
Lymphocytes Relative: 43.8 % (ref 12.0–46.0)
Lymphs Abs: 3.1 10*3/uL (ref 0.7–4.0)
MCHC: 31.6 g/dL (ref 30.0–36.0)
MCV: 85.4 fl (ref 78.0–100.0)
MONOS PCT: 6.6 % (ref 3.0–12.0)
Monocytes Absolute: 0.5 10*3/uL (ref 0.1–1.0)
NEUTROS PCT: 46.8 % (ref 43.0–77.0)
Neutro Abs: 3.3 10*3/uL (ref 1.4–7.7)
Platelets: 340 10*3/uL (ref 150.0–400.0)
RBC: 4.51 Mil/uL (ref 3.87–5.11)
RDW: 12.9 % (ref 11.5–15.5)
WBC: 7.1 10*3/uL (ref 4.0–10.5)

## 2014-04-13 LAB — LDL CHOLESTEROL, DIRECT: Direct LDL: 117.4 mg/dL

## 2014-04-13 LAB — BASIC METABOLIC PANEL
BUN: 14 mg/dL (ref 6–23)
CO2: 25 mEq/L (ref 19–32)
CREATININE: 0.8 mg/dL (ref 0.4–1.2)
Calcium: 9.3 mg/dL (ref 8.4–10.5)
Chloride: 105 mEq/L (ref 96–112)
GFR: 88.65 mL/min (ref >60.00)
GLUCOSE: 109 mg/dL — AB (ref 70–99)
Potassium: 3.5 mEq/L (ref 3.5–5.1)
Sodium: 140 mEq/L (ref 135–145)

## 2014-04-13 LAB — MICROALBUMIN / CREATININE URINE RATIO
CREATININE, U: 149.7 mg/dL
Microalb Creat Ratio: 1.6 mg/g (ref 0.0–30.0)
Microalb, Ur: 2.4 mg/dL — ABNORMAL HIGH (ref 0.0–1.9)

## 2014-04-13 LAB — LIPID PANEL
CHOL/HDL RATIO: 6
CHOLESTEROL: 206 mg/dL — AB (ref 0–200)
HDL: 34.6 mg/dL — ABNORMAL LOW (ref >39.00)
NonHDL: 171.4
TRIGLYCERIDES: 280 mg/dL — AB (ref 0.0–149.0)
VLDL: 56 mg/dL — ABNORMAL HIGH (ref 0.0–40.0)

## 2014-04-13 LAB — HEPATIC FUNCTION PANEL
ALBUMIN: 3.9 g/dL (ref 3.5–5.2)
ALK PHOS: 69 U/L (ref 39–117)
ALT: 15 U/L (ref 0–35)
AST: 18 U/L (ref 0–37)
Bilirubin, Direct: 0 mg/dL (ref 0.0–0.3)
TOTAL PROTEIN: 7 g/dL (ref 6.0–8.3)
Total Bilirubin: 0.5 mg/dL (ref 0.2–1.2)

## 2014-04-13 LAB — HEMOGLOBIN A1C: HEMOGLOBIN A1C: 6.4 % (ref 4.6–6.5)

## 2014-04-20 ENCOUNTER — Encounter: Payer: Self-pay | Admitting: Family Medicine

## 2014-04-20 ENCOUNTER — Ambulatory Visit (INDEPENDENT_AMBULATORY_CARE_PROVIDER_SITE_OTHER): Payer: Medicare HMO | Admitting: Family Medicine

## 2014-04-20 VITALS — BP 130/84 | Temp 98.3°F | Ht 63.0 in | Wt 174.0 lb

## 2014-04-20 DIAGNOSIS — E1165 Type 2 diabetes mellitus with hyperglycemia: Secondary | ICD-10-CM

## 2014-04-20 DIAGNOSIS — E1140 Type 2 diabetes mellitus with diabetic neuropathy, unspecified: Secondary | ICD-10-CM

## 2014-04-20 DIAGNOSIS — N952 Postmenopausal atrophic vaginitis: Secondary | ICD-10-CM

## 2014-04-20 DIAGNOSIS — IMO0002 Reserved for concepts with insufficient information to code with codable children: Secondary | ICD-10-CM

## 2014-04-20 DIAGNOSIS — E119 Type 2 diabetes mellitus without complications: Secondary | ICD-10-CM

## 2014-04-20 DIAGNOSIS — N951 Menopausal and female climacteric states: Secondary | ICD-10-CM

## 2014-04-20 DIAGNOSIS — I10 Essential (primary) hypertension: Secondary | ICD-10-CM

## 2014-04-20 DIAGNOSIS — Z23 Encounter for immunization: Secondary | ICD-10-CM

## 2014-04-20 MED ORDER — ESTROGENS, CONJUGATED 0.625 MG/GM VA CREA: TOPICAL_CREAM | VAGINAL | Status: DC

## 2014-04-20 MED ORDER — METFORMIN HCL 500 MG PO TABS: 500.0000 mg | ORAL_TABLET | Freq: Two times a day (BID) | ORAL | Status: DC

## 2014-04-20 MED ORDER — ATENOLOL 50 MG PO TABS: 50.0000 mg | ORAL_TABLET | Freq: Every day | ORAL | Status: DC

## 2014-04-20 MED ORDER — AMITRIPTYLINE HCL 25 MG PO TABS: 25.0000 mg | ORAL_TABLET | Freq: Every day | ORAL | Status: DC

## 2014-04-20 NOTE — Progress Notes (Signed)
Pre visit review using our clinic review tool, if applicable. No additional management support is needed unless otherwise documented below in the visit note. Lab Results  Component Value Date   HGBA1C 6.4 04/13/2014   HGBA1C 5.9 10/06/2013   HGBA1C 5.9 04/08/2013   Lab Results  Component Value Date   MICROALBUR 2.4* 04/13/2014   CREATININE 0.8 04/13/2014

## 2014-04-20 NOTE — Progress Notes (Signed)
   Subjective:    Patient ID: Erin Hatfield, female    DOB: [birth year], 63 y.o.   MRN: [id number]  HPI Zaneta is a 63 year old married female nonsmoker who comes in today for evaluation of hypertension and diabetes  She also takes 25 mg of Elavil daily at bedtime because of peripheral neuropathy  She's on Tenormin 50 mg daily for hypertension BP 130/84  She is on metformin 500 twice a day for diabetes she walks 30 minutes to 45 minutes 5 times weekly at the mall. Her A1c is 6.4%  She also uses Premarin vaginal cream for vaginal dryness. She's had her uterus out for nonmalignant reasons ovaries were left intact. No symptoms of ovarian dysfunction.... No bloating cramping unexplained abdominal pain etc.  She gets routine eye care, dental care, BSE monthly, annual mammography, colonoscopy 9 years normal ago normal. Vaccinations updated by Apolonio Schneiders  Cognitive function normal she walks 5 days out of 7 at the mall, home health safety reviewed no issues identified, no guns in the house, she does not have a healthcare power of attorney nor living well........Marland Kitchen encouraged to do so   Review of Systems  Constitutional: Negative.   HENT: Negative.   Eyes: Negative.   Respiratory: Negative.   Cardiovascular: Negative.   Gastrointestinal: Negative.   Endocrine: Negative.   Genitourinary: Negative.   Musculoskeletal: Negative.   Skin: Negative.   Allergic/Immunologic: Negative.   Neurological: Negative.   Hematological: Negative.   Psychiatric/Behavioral: Negative.        Objective:   Physical Exam  Constitutional: She appears well-developed and well-nourished.  HENT:  Head: Normocephalic and atraumatic.  Right Ear: External ear normal.  Left Ear: External ear normal.  Nose: Nose normal.  Mouth/Throat: Oropharynx is clear and moist.  Eyes: EOM are normal. Pupils are equal, round, and reactive to light.  Neck: Normal range of motion. Neck supple. No JVD present. No tracheal deviation  present. No thyromegaly present.  Cardiovascular: Normal rate, regular rhythm, normal heart sounds and intact distal pulses.  Exam reveals no gallop and no friction rub.   No murmur heard. Pulmonary/Chest: Effort normal and breath sounds normal. No stridor. No respiratory distress. She has no wheezes. She has no rales. She exhibits no tenderness.  Abdominal: Soft. Bowel sounds are normal. She exhibits no distension and no mass. There is no tenderness. There is no rebound and no guarding.  Genitourinary:  Bilateral breast exam normal  Musculoskeletal: Normal range of motion.  Lymphadenopathy:    She has no cervical adenopathy.  Neurological: She is alert. She has normal reflexes. No cranial nerve deficit. She exhibits normal muscle tone. Coordination normal.  Skin: Skin is warm and dry. No rash noted. No erythema. No pallor.  Psychiatric: She has a normal mood and affect. Her behavior is normal. Judgment and thought content normal.  Nursing note and vitals reviewed.         Assessment & Plan:  Healthy female  Diabetes type 2 at goal.....Marland Kitchen continue current therapy follow-up in 6 months  Hypertension ago........ continue current therapy  Peripheral neuropathy...Marland KitchenMarland KitchenMarland Kitchen continue good diabetes control Elavil 25 mg daily at bedtime  Postmenopausal vaginal dryness.... Premarin vaginal cream when necessary  Status post hysterectomy for nonmalignant reasons

## 2014-04-20 NOTE — Patient Instructions (Signed)
Continue current therapy  Continue walking program!!!!!!!!!!  Follow-up the last week and may........ nonfasting labs one week prior

## 2014-04-22 ENCOUNTER — Telehealth: Payer: Self-pay | Admitting: Family Medicine

## 2014-04-22 NOTE — Telephone Encounter (Signed)
emmi emailed °

## 2014-07-01 ENCOUNTER — Telehealth: Payer: Self-pay

## 2014-07-01 NOTE — Telephone Encounter (Signed)
Pt called concerning her bs readings over the past week. Pt states her normal bs readings are usually between 105-120. For the month of Jan and Feb pt has been getting crazy readings ranging from 146 to 150.  Pt states this morning she got 159 and decided to retake her bs 2 minutes later and got 131.  Pt is wondering with the crazy readings if it could be the meters. Pt has a one-touch ultra mini for about 3 years.  Advised pt to come to the office to get a new meter and to monitor her bs readings over the weekend and give our office a call on Monday. Pt verbalized understanding.

## 2014-07-13 ENCOUNTER — Other Ambulatory Visit (INDEPENDENT_AMBULATORY_CARE_PROVIDER_SITE_OTHER): Payer: Medicare HMO

## 2014-07-13 DIAGNOSIS — E119 Type 2 diabetes mellitus without complications: Secondary | ICD-10-CM

## 2014-07-13 LAB — HEMOGLOBIN A1C: Hgb A1c MFr Bld: 6.5 % (ref 4.6–6.5)

## 2014-07-13 LAB — BASIC METABOLIC PANEL
BUN: 15 mg/dL (ref 6–23)
CO2: 27 mEq/L (ref 19–32)
Calcium: 10 mg/dL (ref 8.4–10.5)
Chloride: 104 mEq/L (ref 96–112)
Creatinine, Ser: 0.92 mg/dL (ref 0.40–1.20)
GFR: 78.66 mL/min (ref >60.00)
Glucose, Bld: 127 mg/dL — ABNORMAL HIGH (ref 70–99)
POTASSIUM: 4.4 meq/L (ref 3.5–5.1)
SODIUM: 141 meq/L (ref 135–145)

## 2014-07-19 ENCOUNTER — Telehealth: Payer: Self-pay | Admitting: Family Medicine

## 2014-07-19 ENCOUNTER — Other Ambulatory Visit: Payer: Self-pay

## 2014-07-19 DIAGNOSIS — Z1231 Encounter for screening mammogram for malignant neoplasm of breast: Secondary | ICD-10-CM

## 2014-07-19 NOTE — Telephone Encounter (Signed)
fyi Pt A1C is good and she would like to let rachel know she was just continue to monitor her BS

## 2014-08-26 ENCOUNTER — Ambulatory Visit
Admission: RE | Admit: 2014-08-26 | Discharge: 2014-08-26 | Disposition: A | Discharge status: Home / Self Care | Payer: Medicare HMO | Source: Ambulatory Visit

## 2014-08-26 DIAGNOSIS — Z1231 Encounter for screening mammogram for malignant neoplasm of breast: Secondary | ICD-10-CM

## 2014-09-29 ENCOUNTER — Other Ambulatory Visit: Payer: Medicare HMO

## 2014-10-06 ENCOUNTER — Ambulatory Visit (INDEPENDENT_AMBULATORY_CARE_PROVIDER_SITE_OTHER): Payer: Medicare PPO | Admitting: Family Medicine

## 2014-10-06 ENCOUNTER — Encounter: Payer: Self-pay | Admitting: Family Medicine

## 2014-10-06 VITALS — BP 124/84 | Temp 98.2°F | Wt 177.0 lb

## 2014-10-06 DIAGNOSIS — E139 Other specified diabetes mellitus without complications: Secondary | ICD-10-CM | POA: Insufficient documentation

## 2014-10-06 DIAGNOSIS — I10 Essential (primary) hypertension: Secondary | ICD-10-CM | POA: Diagnosis not present

## 2014-10-06 MED ORDER — LISINOPRIL 5 MG PO TABS: 5.0000 mg | ORAL_TABLET | Freq: Every day | ORAL | Status: DC

## 2014-10-06 MED ORDER — SIMVASTATIN 10 MG PO TABS: 10.0000 mg | ORAL_TABLET | Freq: Every day | ORAL | Status: DC

## 2014-10-06 NOTE — Progress Notes (Signed)
   Subjective:    Patient ID: Erin Hatfield, female    DOB: December 05, 1950, 64 y.o.   MRN: [id number]  HPI Erin Hatfield is a 64 year old female nonsmoker who comes in today for evaluation of diabetes and hypertension  A1c is 6.5%. She is meticulous about her diet and exercise. She takes metformin 500 mg twice a day. She does not recall being on an ACE inhibitor. We will add an ACE inhibitor for low-dose renal protection  LDL cholesterol 117 we'll add a statin to get her LDL cholesterol below 75. Follow-up lipid and liver panel in 2 months   Review of Systems Review of systems otherwise negative    Objective:   Physical Exam  Well-developed well-nourished female no acute distress vital signs stable she's afebrile BP 124/84 right arm sitting position      Assessment & Plan:  Diabetes at goal.....Marland Kitchen continue current therapy follow-up CPX in the fall  Hypertension at goal....... continue current therapy  Add low-dose ACE inhibitor.......... warned about side effects of cough and hives....... and add low-dose statin........ follow-up lipid panel in the fall unless she has side effects of muscle or joint pain

## 2014-10-06 NOTE — Patient Instructions (Signed)
Set up your annual exam December 8........... Cory nafzinger our new adult nurse practitioner  Fasting labs one week prior  Continue diet and exercise and medications  Add lisinopril 5 mg.......Marland Kitchen 1 daily in the morning for renal protection.......... potential side effects her hives and cough. If you develop hives stop the medicine immediately and call  Add Lipitor 10 mg......... one daily at bedtime to get your LDL below 75....... potential side effects of muscle and joint pain  Rachel's extension is 2231

## 2014-10-06 NOTE — Progress Notes (Signed)
Pre visit review using our clinic review tool, if applicable. No additional management support is needed unless otherwise documented below in the visit note. 

## 2014-12-16 ENCOUNTER — Encounter: Payer: Self-pay | Admitting: Internal Medicine

## 2014-12-20 DIAGNOSIS — H40033 Anatomical narrow angle, bilateral: Secondary | ICD-10-CM | POA: Diagnosis not present

## 2014-12-20 DIAGNOSIS — E119 Type 2 diabetes mellitus without complications: Secondary | ICD-10-CM | POA: Diagnosis not present

## 2015-01-24 ENCOUNTER — Other Ambulatory Visit: Payer: Self-pay | Admitting: Family Medicine

## 2015-02-15 ENCOUNTER — Ambulatory Visit (AMBULATORY_SURGERY_CENTER): Payer: Self-pay

## 2015-02-15 VITALS — Ht 62.0 in | Wt 177.8 lb

## 2015-02-15 DIAGNOSIS — Z1211 Encounter for screening for malignant neoplasm of colon: Secondary | ICD-10-CM

## 2015-02-15 NOTE — Progress Notes (Signed)
Per pt, no allergies to soy or egg products.Pt not taking any weight loss meds or using  O2 at home. 

## 2015-03-01 ENCOUNTER — Encounter: Payer: Self-pay | Admitting: Internal Medicine

## 2015-03-01 ENCOUNTER — Ambulatory Visit (AMBULATORY_SURGERY_CENTER): Payer: Medicare PPO | Admitting: Internal Medicine

## 2015-03-01 VITALS — BP 121/60 | HR 72 | Temp 97.9°F | Resp 20 | Ht 62.0 in | Wt 177.0 lb

## 2015-03-01 DIAGNOSIS — I10 Essential (primary) hypertension: Secondary | ICD-10-CM | POA: Diagnosis not present

## 2015-03-01 DIAGNOSIS — D124 Benign neoplasm of descending colon: Secondary | ICD-10-CM | POA: Diagnosis not present

## 2015-03-01 DIAGNOSIS — D120 Benign neoplasm of cecum: Secondary | ICD-10-CM

## 2015-03-01 DIAGNOSIS — Z1211 Encounter for screening for malignant neoplasm of colon: Secondary | ICD-10-CM

## 2015-03-01 DIAGNOSIS — E669 Obesity, unspecified: Secondary | ICD-10-CM | POA: Diagnosis not present

## 2015-03-01 DIAGNOSIS — E119 Type 2 diabetes mellitus without complications: Secondary | ICD-10-CM | POA: Diagnosis not present

## 2015-03-01 MED ORDER — SODIUM CHLORIDE 0.9 % IV SOLN: 500.0000 mL | INTRAVENOUS | Status: DC

## 2015-03-01 NOTE — Patient Instructions (Signed)
YOU HAD AN ENDOSCOPIC PROCEDURE TODAY AT THE [facility]:   Refer to the procedure report that was given to you for any specific questions about what was found during the examination.  If the procedure report does not answer your questions, please call your gastroenterologist to clarify.  If you requested that your care partner not be given the details of your procedure findings, then the procedure report has been included in a sealed envelope for you to review at your convenience later.  YOU SHOULD EXPECT: Some feelings of bloating in the abdomen. Passage of more gas than usual.  Walking can help get rid of the air that was put into your GI tract during the procedure and reduce the bloating. If you had a lower endoscopy (such as a colonoscopy or flexible sigmoidoscopy) you may notice spotting of blood in your stool or on the toilet paper. If you underwent a bowel prep for your procedure, you may not have a normal bowel movement for a few days.  Please Note:  You might notice some irritation and congestion in your nose or some drainage.  This is from the oxygen used during your procedure.  There is no need for concern and it should clear up in a day or so.  SYMPTOMS TO REPORT IMMEDIATELY:   Following lower endoscopy (colonoscopy or flexible sigmoidoscopy):  Excessive amounts of blood in the stool  Significant tenderness or worsening of abdominal pains  Swelling of the abdomen that is new, acute  Fever of 100F or higher  For urgent or emergent issues, a gastroenterologist can be reached at any hour by calling [phone].   DIET: Your first meal following the procedure should be a small meal and then it is ok to progress to your normal diet. Heavy or fried foods are harder to digest and may make you feel nauseous or bloated.  Likewise, meals heavy in dairy and vegetables can increase bloating.  Drink plenty of fluids but you should avoid alcoholic beverages for 24  hours.  ACTIVITY:  You should plan to take it easy for the rest of today and you should NOT DRIVE or use heavy machinery until tomorrow (because of the sedation medicines used during the test).    FOLLOW UP: Our staff will call the number listed on your records the next business day following your procedure to check on you and address any questions or concerns that you may have regarding the information given to you following your procedure. If we do not reach you, we will leave a message.  However, if you are feeling well and you are not experiencing any problems, there is no need to return our call.  We will assume that you have returned to your regular daily activities without incident.  If any biopsies were taken you will be contacted by phone or by letter within the next 1-3 weeks.  Please call us at [phone] if you have not heard about the biopsies in 3 weeks.    SIGNATURES/CONFIDENTIALITY: You and/or your care partner have signed paperwork which will be entered into your electronic medical record.  These signatures attest to the fact that that the information above on your After Visit Summary has been reviewed and is understood.  Full responsibility of the confidentiality of this discharge information lies with you and/or your care-partner.  Please review polyp handout provided. Next colonoscopy determined by pathology results; 5 or 10 years. 

## 2015-03-01 NOTE — Op Note (Signed)
[facility]  [address] [place], [address]   COLONOSCOPY PROCEDURE REPORT  PATIENT: Erin Hatfield, Erin Hatfield  MR#: [id number] BIRTHDATE: 1951-03-11 , 58  yrs. old GENDER: female ENDOSCOPIST: Jerene Bears, MD REFERRED ZD:GLOVFIE Delora Fuel, M.D. PROCEDURE DATE:  03/01/2015 PROCEDURE:   Colonoscopy, screening and Colonoscopy with snare polypectomy First Screening Colonoscopy - Avg.  risk and is 50 yrs.  old or older - No.  Prior Negative Screening - Now for repeat screening. 10 or more years since last screening  History of Adenoma - Now for follow-up colonoscopy & has been > or = to 3 yrs.  N/A  Polyps removed today? Yes ASA CLASS:   Class II INDICATIONS:Screening for colonic neoplasia, Colorectal Neoplasm Risk Assessment for this procedure is average risk, and normal colonoscopy 10 years ago. MEDICATIONS: Monitored anesthesia care and Propofol 200 mg IV  DESCRIPTION OF PROCEDURE:   After the risks benefits and alternatives of the procedure were thoroughly explained, informed consent was obtained.  The digital rectal exam revealed no rectal mass.   The LB [id number]  endoscope was introduced through the anus and advanced to the cecum, which was identified by both the appendix and ileocecal valve. No adverse events experienced. The quality of the prep was good.  (Suprep was used)  The instrument was then slowly withdrawn as the colon was fully examined. Estimated blood loss is zero unless otherwise noted in this procedure report.  COLON FINDINGS: Two sessile polyps ranging between 3-62mm in size, the cecal polyp with a  mucous cap, were found at the cecum and in the descending colon.  Polypectomies were performed with a cold snare.  The resection was complete, the polyp tissue was completely retrieved and sent to histology.   The examination was otherwise normal.  Retroflexed views revealed internal hemorrhoids. The time to cecum = 5.4 Withdrawal time = 15.5    The scope was withdrawn and the procedure completed. COMPLICATIONS: There were no immediate complications.  ENDOSCOPIC IMPRESSION: 1.   Two sessile polyps ranging between 3-41mm in size were found at the cecum and in the descending colon; polypectomies were performed with a cold snare 2.   The examination was otherwise normal  RECOMMENDATIONS: 1.  Await pathology results 2.  If the polyps removed today are proven to be adenomatous (pre-cancerous) polyps, you will need a repeat colonoscopy in 5 years.  Otherwise you should continue to follow colorectal cancer screening guidelines for "routine risk" patients with colonoscopy in 10 years.  You will receive a letter within 1-2 weeks with the results of your biopsy as well as final recommendations.  Please call my office if you have not received a letter after 3 weeks.  eSigned:  Jerene Bears, MD 03/01/2015 11:18 AM  cc: Dorena Cookey, MD and The Patient

## 2015-03-01 NOTE — Progress Notes (Signed)
Called to room to assist during endoscopic procedure.  Patient ID and intended procedure confirmed with present staff. Received instructions for my participation in the procedure from the performing physician.  

## 2015-03-01 NOTE — Progress Notes (Signed)
To recovery, report to Myers, RN, VSS. 

## 2015-03-01 NOTE — Progress Notes (Signed)
Dental advisory given to patient 

## 2015-03-02 ENCOUNTER — Telehealth: Payer: Self-pay

## 2015-03-02 ENCOUNTER — Other Ambulatory Visit: Payer: Self-pay | Admitting: *Deleted

## 2015-03-02 MED ORDER — GLUCOSE BLOOD VI STRP: ORAL_STRIP | Status: DC

## 2015-03-02 NOTE — Telephone Encounter (Signed)
Left a message at [phone] for the pt to call us back if any questions or concerns. maw

## 2015-03-08 ENCOUNTER — Encounter: Payer: Self-pay | Admitting: Internal Medicine

## 2015-04-19 ENCOUNTER — Other Ambulatory Visit (INDEPENDENT_AMBULATORY_CARE_PROVIDER_SITE_OTHER): Payer: Medicare PPO

## 2015-04-19 DIAGNOSIS — Z0000 Encounter for general adult medical examination without abnormal findings: Secondary | ICD-10-CM

## 2015-04-19 DIAGNOSIS — R7989 Other specified abnormal findings of blood chemistry: Secondary | ICD-10-CM | POA: Diagnosis not present

## 2015-04-19 DIAGNOSIS — I10 Essential (primary) hypertension: Secondary | ICD-10-CM | POA: Diagnosis not present

## 2015-04-19 LAB — BASIC METABOLIC PANEL
BUN: 13 mg/dL (ref 6–23)
CO2: 28 mEq/L (ref 19–32)
Calcium: 9.5 mg/dL (ref 8.4–10.5)
Chloride: 103 mEq/L (ref 96–112)
Creatinine, Ser: 0.86 mg/dL (ref 0.40–1.20)
GFR: 84.83 mL/min (ref >60.00)
Glucose, Bld: 140 mg/dL — ABNORMAL HIGH (ref 70–99)
Potassium: 3.9 mEq/L (ref 3.5–5.1)
SODIUM: 140 meq/L (ref 135–145)

## 2015-04-19 LAB — HEPATIC FUNCTION PANEL
ALT: 17 U/L (ref 0–35)
AST: 18 U/L (ref 0–37)
Albumin: 4.1 g/dL (ref 3.5–5.2)
Alkaline Phosphatase: 75 U/L (ref 39–117)
BILIRUBIN TOTAL: 0.6 mg/dL (ref 0.2–1.2)
Bilirubin, Direct: 0.1 mg/dL (ref 0.0–0.3)
Total Protein: 7.2 g/dL (ref 6.0–8.3)

## 2015-04-19 LAB — LIPID PANEL
CHOL/HDL RATIO: 4
CHOLESTEROL: 147 mg/dL (ref 0–200)
HDL: 33.8 mg/dL — AB (ref >39.00)
NonHDL: 113.3
Triglycerides: 252 mg/dL — ABNORMAL HIGH (ref 0.0–149.0)
VLDL: 50.4 mg/dL — AB (ref 0.0–40.0)

## 2015-04-19 LAB — HEMOGLOBIN A1C: HEMOGLOBIN A1C: 6.5 % (ref 4.6–6.5)

## 2015-04-19 LAB — TSH: TSH: 4.68 u[IU]/mL — ABNORMAL HIGH (ref 0.35–4.50)

## 2015-04-19 LAB — CBC WITH DIFFERENTIAL/PLATELET
Basophils Absolute: 0.1 10*3/uL (ref 0.0–0.1)
Basophils Relative: 0.8 % (ref 0.0–3.0)
EOS PCT: 2.4 % (ref 0.0–5.0)
Eosinophils Absolute: 0.2 10*3/uL (ref 0.0–0.7)
HCT: 38.5 % (ref 36.0–46.0)
HEMOGLOBIN: 12.3 g/dL (ref 12.0–15.0)
Lymphocytes Relative: 47.5 % — ABNORMAL HIGH (ref 12.0–46.0)
Lymphs Abs: 3.4 10*3/uL (ref 0.7–4.0)
MCHC: 31.8 g/dL (ref 30.0–36.0)
MCV: 84.1 fl (ref 78.0–100.0)
MONO ABS: 0.5 10*3/uL (ref 0.1–1.0)
MONOS PCT: 7.5 % (ref 3.0–12.0)
Neutro Abs: 3 10*3/uL (ref 1.4–7.7)
Neutrophils Relative %: 41.8 % — ABNORMAL LOW (ref 43.0–77.0)
Platelets: 367 10*3/uL (ref 150.0–400.0)
RBC: 4.59 Mil/uL (ref 3.87–5.11)
RDW: 12.6 % (ref 11.5–15.5)
WBC: 7.3 10*3/uL (ref 4.0–10.5)

## 2015-04-19 LAB — POCT URINALYSIS DIPSTICK
BILIRUBIN UA: NEGATIVE
Blood, UA: NEGATIVE
Glucose, UA: NEGATIVE
KETONES UA: NEGATIVE
Nitrite, UA: NEGATIVE
PH UA: 5.5
Protein, UA: NEGATIVE
Spec Grav, UA: 1.02
Urobilinogen, UA: 0.2

## 2015-04-19 LAB — LDL CHOLESTEROL, DIRECT: Direct LDL: 74 mg/dL

## 2015-04-19 LAB — MICROALBUMIN / CREATININE URINE RATIO
Creatinine,U: 103.8 mg/dL
MICROALB/CREAT RATIO: 0.7 mg/g (ref 0.0–30.0)
Microalb, Ur: <0.7 mg/dL (ref 0.0–1.9)

## 2015-04-26 ENCOUNTER — Ambulatory Visit (INDEPENDENT_AMBULATORY_CARE_PROVIDER_SITE_OTHER): Payer: Medicare PPO | Admitting: Family Medicine

## 2015-04-26 ENCOUNTER — Encounter: Payer: Self-pay | Admitting: Family Medicine

## 2015-04-26 VITALS — BP 120/84 | Temp 98.2°F | Ht 64.0 in | Wt 177.0 lb

## 2015-04-26 DIAGNOSIS — E139 Other specified diabetes mellitus without complications: Secondary | ICD-10-CM

## 2015-04-26 DIAGNOSIS — R3129 Other microscopic hematuria: Secondary | ICD-10-CM

## 2015-04-26 DIAGNOSIS — I10 Essential (primary) hypertension: Secondary | ICD-10-CM

## 2015-04-26 DIAGNOSIS — E1143 Type 2 diabetes mellitus with diabetic autonomic (poly)neuropathy: Secondary | ICD-10-CM

## 2015-04-26 DIAGNOSIS — Z23 Encounter for immunization: Secondary | ICD-10-CM | POA: Diagnosis not present

## 2015-04-26 DIAGNOSIS — Z0000 Encounter for general adult medical examination without abnormal findings: Secondary | ICD-10-CM | POA: Diagnosis not present

## 2015-04-26 DIAGNOSIS — H6123 Impacted cerumen, bilateral: Secondary | ICD-10-CM

## 2015-04-26 DIAGNOSIS — IMO0002 Reserved for concepts with insufficient information to code with codable children: Secondary | ICD-10-CM

## 2015-04-26 DIAGNOSIS — N951 Menopausal and female climacteric states: Secondary | ICD-10-CM

## 2015-04-26 DIAGNOSIS — E1165 Type 2 diabetes mellitus with hyperglycemia: Secondary | ICD-10-CM

## 2015-04-26 DIAGNOSIS — E1140 Type 2 diabetes mellitus with diabetic neuropathy, unspecified: Secondary | ICD-10-CM

## 2015-04-26 DIAGNOSIS — N952 Postmenopausal atrophic vaginitis: Secondary | ICD-10-CM

## 2015-04-26 MED ORDER — METFORMIN HCL 500 MG PO TABS
500.0000 mg | ORAL_TABLET | Freq: Two times a day (BID) | ORAL | Status: DC
Start: 2015-04-26 — End: 2016-05-01

## 2015-04-26 MED ORDER — AMITRIPTYLINE HCL 25 MG PO TABS: 25.0000 mg | ORAL_TABLET | Freq: Every day | ORAL | Status: DC

## 2015-04-26 MED ORDER — ESTROGENS, CONJUGATED 0.625 MG/GM VA CREA: TOPICAL_CREAM | VAGINAL | Status: DC

## 2015-04-26 MED ORDER — ATENOLOL 50 MG PO TABS: 50.0000 mg | ORAL_TABLET | Freq: Every day | ORAL | Status: DC

## 2015-04-26 NOTE — Patient Instructions (Addendum)
Continue current medications diet and exercise  Follow-up in June for your diabetes  Nonfasting labs one week prior  [place] or Almyra Free are 2 new nurse practitioners or Dr. Martin[place]  Thanks for coming to see me all these years  Check with your insurance company about the tetanus booster

## 2015-04-26 NOTE — Progress Notes (Signed)
Pre visit review using our clinic review tool, if applicable. No additional management support is needed unless otherwise documented below in the visit note. 

## 2015-04-26 NOTE — Progress Notes (Signed)
Subjective:    Patient ID: Erin Hatfield, female    DOB: 1951-03-13, 64 y.o.   MRN: [id number]  HPI Shunita is a 64 year old married female nonsmoker who comes in today for general physical examination because of a history of hypertension, diabetes, neuropathy secondary to diabetes, hyperlipidemia, obesity class I  Her weight is steady at 177 pounds. She walks 45 minutes every day. A1c 6.5% on metformin 500 mg twice a day  She takes Tenormin and aspirin for hypertension control BP 120/84. We added lisinopril 5 mg daily for renal protection however she's got a severe cough. We'll stop the lisinopril.  She uses Premarin cream weekly for vaginal dryness and Zocor 10 mg daily for hyperlipidemia. LDL 74.  She gets routine eye care, dental care, BSE monthly, annual mammography, colonoscopy 2016 normal.  She's given on Pneumovax and flu shot today. She do a tetanus booster in July.  She had her uterus removed for nonmalignant reasons. Ovaries left intact. She's asymptomatic. Specifically no abdominal bloating etc. etc.  Cognitive function normal she walks daily home health safety reviewed no issues identified, no guns in the house, she does have a healthcare power of attorney and living well   Review of Systems  Constitutional: Negative.   HENT: Negative.   Eyes: Negative.   Respiratory: Negative.   Cardiovascular: Negative.   Gastrointestinal: Negative.   Endocrine: Negative.   Genitourinary: Negative.   Musculoskeletal: Negative.   Skin: Negative.   Allergic/Immunologic: Negative.   Neurological: Negative.   Hematological: Negative.   Psychiatric/Behavioral: Negative.        Objective:   Physical Exam  Constitutional: She is oriented to person, place, and time. She appears well-developed and well-nourished.  HENT:  Head: Normocephalic and atraumatic.  Right Ear: External ear normal.  Left Ear: External ear normal.  Nose: Nose normal.  Mouth/Throat: Oropharynx is clear  and moist.  Bilateral cataracts,,,,,,,, states vision is normal with glasses. Recent eye exam by optometrist. Recommend she see an ophthalmologist in the future  Eyes: EOM are normal. Pupils are equal, round, and reactive to light.  Bilateral cerumen impactions........Marland Kitchen relieved by irrigation  Neck: Normal range of motion. Neck supple. No JVD present. No tracheal deviation present. No thyromegaly present.  Cardiovascular: Normal rate, regular rhythm, normal heart sounds and intact distal pulses.  Exam reveals no gallop and no friction rub.   No murmur heard. Pulmonary/Chest: Effort normal and breath sounds normal. No stridor. No respiratory distress. She has no wheezes. She has no rales. She exhibits no tenderness.  Abdominal: Soft. Bowel sounds are normal. She exhibits no distension and no mass. There is no tenderness. There is no rebound and no guarding.  Genitourinary:  Bilateral breast exam normal  Musculoskeletal: Normal range of motion.  Lymphadenopathy:    She has no cervical adenopathy.  Neurological: She is alert and oriented to person, place, and time. She has normal reflexes. She displays normal reflexes. No cranial nerve deficit. She exhibits normal muscle tone. Coordination normal.  Skin: Skin is warm and dry. No rash noted. No erythema. No pallor.  Psychiatric: She has a normal mood and affect. Her behavior is normal. Judgment and thought content normal.  Nursing note and vitals reviewed.         Assessment & Plan:  Diabetes type 2 at goal with A1c of 6.5%....... continue diet exercise and current medications follow-up in 6 months  Hypertension at goal........... continue current therapy  Hyperlipidemia goal........... continue Lipitor and aspirin  Cough secondary  to ACE inhibitor......... stop ACE inhibitor  Obesity class I........ again F emphasize diet exercise and weight loss  Neuropathy........ continue Elavil  Postmenopausal vaginal dryness........ Premarin  cream when necessary

## 2015-07-18 ENCOUNTER — Other Ambulatory Visit: Payer: Self-pay

## 2015-07-18 DIAGNOSIS — Z1231 Encounter for screening mammogram for malignant neoplasm of breast: Secondary | ICD-10-CM

## 2015-08-29 ENCOUNTER — Ambulatory Visit
Admission: RE | Admit: 2015-08-29 | Discharge: 2015-08-29 | Disposition: A | Discharge status: Home / Self Care | Payer: Medicare Other | Source: Ambulatory Visit

## 2015-08-29 DIAGNOSIS — Z1231 Encounter for screening mammogram for malignant neoplasm of breast: Secondary | ICD-10-CM

## 2015-10-18 ENCOUNTER — Other Ambulatory Visit (INDEPENDENT_AMBULATORY_CARE_PROVIDER_SITE_OTHER): Payer: Medicare Other

## 2015-10-18 DIAGNOSIS — E139 Other specified diabetes mellitus without complications: Secondary | ICD-10-CM | POA: Diagnosis not present

## 2015-10-18 LAB — BASIC METABOLIC PANEL
BUN: 14 mg/dL (ref 6–23)
CALCIUM: 9.6 mg/dL (ref 8.4–10.5)
CO2: 26 mEq/L (ref 19–32)
CREATININE: 0.82 mg/dL (ref 0.40–1.20)
Chloride: 102 mEq/L (ref 96–112)
GFR: 89.48 mL/min (ref >60.00)
Glucose, Bld: 138 mg/dL — ABNORMAL HIGH (ref 70–99)
Potassium: 3.8 mEq/L (ref 3.5–5.1)
Sodium: 138 mEq/L (ref 135–145)

## 2015-10-18 LAB — HEMOGLOBIN A1C: HEMOGLOBIN A1C: 6.8 % — AB (ref 4.6–6.5)

## 2015-10-25 ENCOUNTER — Encounter: Payer: Self-pay | Admitting: Adult Health

## 2015-10-25 ENCOUNTER — Ambulatory Visit (INDEPENDENT_AMBULATORY_CARE_PROVIDER_SITE_OTHER): Payer: Medicare Other | Admitting: Adult Health

## 2015-10-25 ENCOUNTER — Ambulatory Visit: Payer: Medicare PPO | Admitting: Adult Health

## 2015-10-25 VITALS — BP 166/70 | HR 75 | Temp 98.3°F | Ht 64.0 in | Wt 180.3 lb

## 2015-10-25 DIAGNOSIS — Z780 Asymptomatic menopausal state: Secondary | ICD-10-CM

## 2015-10-25 DIAGNOSIS — Z7689 Persons encountering health services in other specified circumstances: Secondary | ICD-10-CM

## 2015-10-25 DIAGNOSIS — E139 Other specified diabetes mellitus without complications: Secondary | ICD-10-CM

## 2015-10-25 DIAGNOSIS — I10 Essential (primary) hypertension: Secondary | ICD-10-CM | POA: Diagnosis not present

## 2015-10-25 DIAGNOSIS — Z7189 Other specified counseling: Secondary | ICD-10-CM

## 2015-10-25 NOTE — Patient Instructions (Signed)
It was great meeting you today!  Keep up the good work with the diabetes and hypertension.   Follow up with me in December for your physical.   If you need anything in the meantime, please let me know.

## 2015-10-25 NOTE — Progress Notes (Signed)
Pre visit review using our clinic review tool, if applicable. No additional management support is needed unless otherwise documented below in the visit note. 

## 2015-10-25 NOTE — Progress Notes (Signed)
Patient presents to clinic today to establish care. She is a pleasant 65 year old female who  has a past medical history of Hypertension; Bunion, left foot; Allergy; Hot flashes; and Diabetes mellitus without complication ([place]).   Her last physical was in December 2016 with MD Sherren Mocha   Acute Concerns: Establish Care  Chronic Issues: Hypertension  She reports that her blood pressure is well controlled. She did not take her medication this morning. She monitors at home and it is WNL there.   DM Her diabetes is well controlled on current therapy. She currently takes Metformin 500mg    Myopathy - She started to experience muscle cramps in her lower extremities. This was only happing at night. She stopped her statin and the cramps went away. Since then she has been taking 5 mg of simvastatin and has not had any issues.    Health Maintenance: Dental -- Twice a year Vision -- Yearly  Immunizations -- UTD Colonoscopy -- 02/2015 - Normal  - 10 year  Mammogram -- 08/29/2015 - Normal  PAP -- No longer needs  Bone Density -- Never had  Diet: Follows and healthy  Exercise: Walks every day at the mall, 30-45 minutes     Past Medical History  Diagnosis Date  . Hypertension   . Bunion, left foot   . Allergy   . Hot flashes   . Diabetes mellitus without complication [facility])     Past Surgical History  Procedure Laterality Date  . Abdominal hysterectomy      Current Outpatient Prescriptions on File Prior to Visit  Medication Sig Dispense Refill  . amitriptyline (ELAVIL) 25 MG tablet Take 1 tablet (25 mg total) by mouth at bedtime. 100 tablet 3  . aspirin 81 MG tablet Take 81 mg by mouth daily.      Marland Kitchen atenolol (TENORMIN) 50 MG tablet Take 1 tablet (50 mg total) by mouth daily. 100 tablet 3  . cetirizine (ZYRTEC) 10 MG tablet Take 10 mg by mouth as needed.     . cholecalciferol (VITAMIN D) 1000 UNITS tablet Take 1,000 Units by mouth daily.      . Chromium-Cinnamon (CINNAMON PLUS  CHROMIUM) 100-500 MCG-MG CAPS Take by mouth 2 (two) times daily.    Marland Kitchen conjugated estrogens (PREMARIN) vaginal cream Uses directed 42.5 g 11  . fish oil-omega-3 fatty acids 1000 MG capsule Take 1 g by mouth daily.      Marland Kitchen glucose blood (ONETOUCH VERIO) test strip Test once daily Dx E11.9 100 each 12  . metFORMIN (GLUCOPHAGE) 500 MG tablet Take 1 tablet (500 mg total) by mouth 2 (two) times daily with a meal. 180 tablet 4  . Multiple Vitamin (MULTIVITAMIN) tablet Take 1 tablet by mouth daily.    . ONE TOUCH LANCETS MISC 1 each by Does not apply route daily. 200 each 3  . simvastatin (ZOCOR) 10 MG tablet Take 1 tablet (10 mg total) by mouth at bedtime. 90 tablet 3   No current facility-administered medications on file prior to visit.    No Known Allergies  Family History  Problem Relation Age of Onset  . Diabetes Other   . Hypertension Other   . Diabetes Sister   . Diabetes Sister   . Colon cancer Neg Hx     Social History   Social History  . Marital Status: Married    Spouse Name: N/A  . Number of Children: N/A  . Years of Education: N/A   Occupational History  .  Not on file.   Social History Main Topics  . Smoking status: Never Smoker   . Smokeless tobacco: Never Used  . Alcohol Use: No  . Drug Use: No  . Sexual Activity: Not on file   Other Topics Concern  . Not on file   Social History Narrative    Review of Systems  Constitutional: Negative.   HENT: Negative.   Eyes: Negative.   Respiratory: Negative.   Cardiovascular: Negative.   Gastrointestinal: Negative.   Musculoskeletal: Negative.   Neurological: Negative.     Temp(Src) 98.3 F (36.8 C) (Oral)  Ht 5\' 4"  (1.626 m)  Wt 180 lb 5 oz (81.789 kg)  BMI 30.94 kg/m2  Physical Exam  Constitutional: She is oriented to person, place, and time and well-developed, well-nourished, and in no distress. No distress.  HENT:  Head: Normocephalic and atraumatic.  Right Ear: External ear normal.  Left Ear:  External ear normal.  Nose: Nose normal.  Mouth/Throat: Oropharynx is clear and moist. No oropharyngeal exudate.  Eyes: Conjunctivae and EOM are normal. Pupils are equal, round, and reactive to light. Right eye exhibits no discharge. Left eye exhibits no discharge.  Neck: Normal range of motion. Neck supple.  Cardiovascular: Normal rate, regular rhythm, normal heart sounds and intact distal pulses.  Exam reveals no gallop and no friction rub.   No murmur heard. Pulmonary/Chest: Effort normal and breath sounds normal. No respiratory distress. She has no wheezes. She has no rales.  Lymphadenopathy:    She has no cervical adenopathy.  Neurological: She is alert and oriented to person, place, and time. GCS score is 15.  Skin: Skin is warm and dry. No rash noted. She is not diaphoretic. No erythema. No pallor.  Psychiatric: Mood, memory, affect and judgment normal.  Vitals reviewed.   Recent Results (from the past 2160 hour(s))  Hemoglobin A1c     Status: Abnormal   Collection Time: 10/18/15  7:54 AM  Result Value Ref Range   Hgb A1c MFr Bld 6.8 (H) 4.6 - 6.5 %    Comment: Glycemic Control Guidelines for People with Diabetes:Non Diabetic:  <6%Goal of Therapy: <7%Additional Action Suggested:  123456   Basic metabolic panel     Status: Abnormal   Collection Time: 10/18/15  7:54 AM  Result Value Ref Range   Sodium 138 135 - 145 mEq/L   Potassium 3.8 3.5 - 5.1 mEq/L   Chloride 102 96 - 112 mEq/L   CO2 26 19 - 32 mEq/L   Glucose, Bld 138 (H) 70 - 99 mg/dL   BUN 14 6 - 23 mg/dL   Creatinine, Ser 0.82 0.40 - 1.20 mg/dL   Calcium 9.6 8.4 - 10.5 mg/dL   GFR 89.48 >60.00 mL/min    Assessment/Plan:  1. Establishing care with new doctor, encounter for - Follow up in December for CPE - Follow up sooner if needed - eat healthy, exercise and stay well hydrated  2. Essential hypertension - Well controlled - No change in medication  - Continue to monitor at home   3. Diabetes 1.5, managed as  type 2 ([place]) - Most recent A1c 6.8 - No change in medication  - continue to check at home  4. Post-menopausal - DG Bone Density; Future  Erin Peng, NP

## 2015-11-16 ENCOUNTER — Other Ambulatory Visit: Payer: Self-pay | Admitting: Family Medicine

## 2016-03-26 ENCOUNTER — Ambulatory Visit (INDEPENDENT_AMBULATORY_CARE_PROVIDER_SITE_OTHER)
Admission: RE | Admit: 2016-03-26 | Discharge: 2016-03-26 | Disposition: A | Discharge status: Home / Self Care | Payer: Medicare Other | Source: Ambulatory Visit | Attending: Adult Health | Admitting: Adult Health

## 2016-03-26 DIAGNOSIS — Z780 Asymptomatic menopausal state: Secondary | ICD-10-CM | POA: Diagnosis not present

## 2016-04-25 ENCOUNTER — Other Ambulatory Visit (INDEPENDENT_AMBULATORY_CARE_PROVIDER_SITE_OTHER): Payer: Medicare Other

## 2016-04-25 DIAGNOSIS — Z0000 Encounter for general adult medical examination without abnormal findings: Secondary | ICD-10-CM | POA: Diagnosis not present

## 2016-04-25 LAB — CBC WITH DIFFERENTIAL/PLATELET
BASOS PCT: 0.6 % (ref 0.0–3.0)
Basophils Absolute: 0 10*3/uL (ref 0.0–0.1)
EOS ABS: 0.2 10*3/uL (ref 0.0–0.7)
EOS PCT: 2.3 % (ref 0.0–5.0)
HEMATOCRIT: 37.4 % (ref 36.0–46.0)
Hemoglobin: 12.2 g/dL (ref 12.0–15.0)
LYMPHS PCT: 49.5 % — AB (ref 12.0–46.0)
Lymphs Abs: 4 10*3/uL (ref 0.7–4.0)
MCHC: 32.7 g/dL (ref 30.0–36.0)
MCV: 83.5 fl (ref 78.0–100.0)
MONOS PCT: 8.1 % (ref 3.0–12.0)
Monocytes Absolute: 0.7 10*3/uL (ref 0.1–1.0)
NEUTROS ABS: 3.2 10*3/uL (ref 1.4–7.7)
Neutrophils Relative %: 39.5 % — ABNORMAL LOW (ref 43.0–77.0)
Platelets: 344 10*3/uL (ref 150.0–400.0)
RBC: 4.48 Mil/uL (ref 3.87–5.11)
RDW: 12.8 % (ref 11.5–15.5)
WBC: 8.1 10*3/uL (ref 4.0–10.5)

## 2016-04-25 LAB — LDL CHOLESTEROL, DIRECT: LDL DIRECT: 88 mg/dL

## 2016-04-25 LAB — BASIC METABOLIC PANEL
BUN: 10 mg/dL (ref 6–23)
CHLORIDE: 102 meq/L (ref 96–112)
CO2: 29 meq/L (ref 19–32)
CREATININE: 0.83 mg/dL (ref 0.40–1.20)
Calcium: 9.7 mg/dL (ref 8.4–10.5)
GFR: 88.1 mL/min (ref >60.00)
Glucose, Bld: 150 mg/dL — ABNORMAL HIGH (ref 70–99)
POTASSIUM: 3.9 meq/L (ref 3.5–5.1)
Sodium: 139 mEq/L (ref 135–145)

## 2016-04-25 LAB — HEPATIC FUNCTION PANEL
ALBUMIN: 4.4 g/dL (ref 3.5–5.2)
ALT: 21 U/L (ref 0–35)
AST: 20 U/L (ref 0–37)
Alkaline Phosphatase: 80 U/L (ref 39–117)
BILIRUBIN DIRECT: 0.1 mg/dL (ref 0.0–0.3)
TOTAL PROTEIN: 6.9 g/dL (ref 6.0–8.3)
Total Bilirubin: 0.6 mg/dL (ref 0.2–1.2)

## 2016-04-25 LAB — POC URINALSYSI DIPSTICK (AUTOMATED)
Bilirubin, UA: NEGATIVE
Blood, UA: NEGATIVE
Glucose, UA: NEGATIVE
Ketones, UA: NEGATIVE
Nitrite, UA: NEGATIVE
Protein, UA: NEGATIVE
Spec Grav, UA: 1.01
Urobilinogen, UA: 0.2
pH, UA: 6.5

## 2016-04-25 LAB — MICROALBUMIN / CREATININE URINE RATIO
CREATININE, U: 59.4 mg/dL
MICROALB/CREAT RATIO: 1.2 mg/g (ref 0.0–30.0)

## 2016-04-25 LAB — HEMOGLOBIN A1C: Hgb A1c MFr Bld: 6.9 % — ABNORMAL HIGH (ref 4.6–6.5)

## 2016-04-25 LAB — LIPID PANEL
Cholesterol: 163 mg/dL (ref 0–200)
HDL: 41 mg/dL (ref >39.00)
NONHDL: 121.76
TRIGLYCERIDES: 258 mg/dL — AB (ref 0.0–149.0)
Total CHOL/HDL Ratio: 4
VLDL: 51.6 mg/dL — ABNORMAL HIGH (ref 0.0–40.0)

## 2016-04-25 LAB — TSH: TSH: 5.05 u[IU]/mL — ABNORMAL HIGH (ref 0.35–4.50)

## 2016-05-01 ENCOUNTER — Encounter: Payer: Medicare Other | Admitting: Adult Health

## 2016-05-01 ENCOUNTER — Ambulatory Visit (INDEPENDENT_AMBULATORY_CARE_PROVIDER_SITE_OTHER): Payer: Medicare Other | Admitting: Adult Health

## 2016-05-01 VITALS — BP 158/90 | Temp 98.2°F | Ht 64.0 in | Wt 178.7 lb

## 2016-05-01 DIAGNOSIS — I10 Essential (primary) hypertension: Secondary | ICD-10-CM | POA: Diagnosis not present

## 2016-05-01 DIAGNOSIS — IMO0002 Reserved for concepts with insufficient information to code with codable children: Secondary | ICD-10-CM

## 2016-05-01 DIAGNOSIS — Z23 Encounter for immunization: Secondary | ICD-10-CM

## 2016-05-01 DIAGNOSIS — E1165 Type 2 diabetes mellitus with hyperglycemia: Secondary | ICD-10-CM

## 2016-05-01 DIAGNOSIS — E1143 Type 2 diabetes mellitus with diabetic autonomic (poly)neuropathy: Secondary | ICD-10-CM

## 2016-05-01 DIAGNOSIS — Z0000 Encounter for general adult medical examination without abnormal findings: Secondary | ICD-10-CM | POA: Diagnosis not present

## 2016-05-01 DIAGNOSIS — E1140 Type 2 diabetes mellitus with diabetic neuropathy, unspecified: Secondary | ICD-10-CM

## 2016-05-01 MED ORDER — GLUCOSE BLOOD VI STRP: ORAL_STRIP | 12 refills | Status: DC

## 2016-05-01 MED ORDER — METFORMIN HCL 500 MG PO TABS: 500.0000 mg | ORAL_TABLET | Freq: Two times a day (BID) | ORAL | 4 refills | Status: DC

## 2016-05-01 MED ORDER — SIMVASTATIN 10 MG PO TABS: ORAL_TABLET | ORAL | 3 refills | Status: DC

## 2016-05-01 MED ORDER — ATENOLOL 50 MG PO TABS: 50.0000 mg | ORAL_TABLET | Freq: Every day | ORAL | 3 refills | Status: DC

## 2016-05-01 MED ORDER — ONETOUCH LANCETS MISC: 1.0000 | Freq: Every day | 3 refills | Status: DC

## 2016-05-01 MED ORDER — AMITRIPTYLINE HCL 25 MG PO TABS: 25.0000 mg | ORAL_TABLET | Freq: Every day | ORAL | 3 refills | Status: DC

## 2016-05-01 NOTE — Patient Instructions (Addendum)
It was great seeing you today!  Your exam was excellent.   A1c has gone up slightly to 6.9 - please work on diet and exercise  Follow up in 3-6 months for retest. If you need anything, please let me know  Health Maintenance, Female Introduction Adopting a healthy lifestyle and getting preventive care can go a long way to promote health and wellness. Talk with your health care provider about what schedule of regular examinations is right for you. This is a good chance for you to check in with your provider about disease prevention and staying healthy. In between checkups, there are plenty of things you can do on your own. Experts have done a lot of research about which lifestyle changes and preventive measures are most likely to keep you healthy. Ask your health care provider for more information. Weight and diet Eat a healthy diet  Be sure to include plenty of vegetables, fruits, low-fat dairy products, and lean protein.  Do not eat a lot of foods high in solid fats, added sugars, or salt.  Get regular exercise. This is one of the most important things you can do for your health.  Most adults should exercise for at least 150 minutes each week. The exercise should increase your heart rate and make you sweat (moderate-intensity exercise).  Most adults should also do strengthening exercises at least twice a week. This is in addition to the moderate-intensity exercise. Maintain a healthy weight  Body mass index (BMI) is a measurement that can be used to identify possible weight problems. It estimates body fat based on height and weight. Your health care provider can help determine your BMI and help you achieve or maintain a healthy weight.  For females 37 years of age and older:  A BMI below 18.5 is considered underweight.  A BMI of 18.5 to 24.9 is normal.  A BMI of 25 to 29.9 is considered overweight.  A BMI of 30 and above is considered obese. Watch levels of cholesterol and blood  lipids  You should start having your blood tested for lipids and cholesterol at 67 years of age, then have this test every 5 years.  You may need to have your cholesterol levels checked more often if:  Your lipid or cholesterol levels are high.  You are older than 67 years of age.  You are at high risk for heart disease. Cancer screening Lung Cancer  Lung cancer screening is recommended for adults 54-51 years old who are at high risk for lung cancer because of a history of smoking.  A yearly low-dose CT scan of the lungs is recommended for people who:  Currently smoke.  Have quit within the past 15 years.  Have at least a 30-pack-year history of smoking. A pack year is smoking an average of one pack of cigarettes a day for 1 year.  Yearly screening should continue until it has been 15 years since you quit.  Yearly screening should stop if you develop a health problem that would prevent you from having lung cancer treatment. Breast Cancer  Practice breast self-awareness. This means understanding how your breasts normally appear and feel.  It also means doing regular breast self-exams. Let your health care provider know about any changes, no matter how small.  If you are in your 20s or 30s, you should have a clinical breast exam (CBE) by a health care provider every 1-3 years as part of a regular health exam.  If you are 40 or  older, have a CBE every year. Also consider having a breast X-ray (mammogram) every year.  If you have a family history of breast cancer, talk to your health care provider about genetic screening.  If you are at high risk for breast cancer, talk to your health care provider about having an MRI and a mammogram every year.  Breast cancer gene (BRCA) assessment is recommended for women who have family members with BRCA-related cancers. BRCA-related cancers include:  Breast.  Ovarian.  Tubal.  Peritoneal cancers.  Results of the assessment will  determine the need for genetic counseling and BRCA1 and BRCA2 testing. Cervical Cancer  Your health care provider may recommend that you be screened regularly for cancer of the pelvic organs (ovaries, uterus, and vagina). This screening involves a pelvic examination, including checking for microscopic changes to the surface of your cervix (Pap test). You may be encouraged to have this screening done every 3 years, beginning at age 63.  For women ages 49-65, health care providers may recommend pelvic exams and Pap testing every 3 years, or they may recommend the Pap and pelvic exam, combined with testing for human papilloma virus (HPV), every 5 years. Some types of HPV increase your risk of cervical cancer. Testing for HPV may also be done on women of any age with unclear Pap test results.  Other health care providers may not recommend any screening for nonpregnant women who are considered low risk for pelvic cancer and who do not have symptoms. Ask your health care provider if a screening pelvic exam is right for you.  If you have had past treatment for cervical cancer or a condition that could lead to cancer, you need Pap tests and screening for cancer for at least 20 years after your treatment. If Pap tests have been discontinued, your risk factors (such as having a new sexual partner) need to be reassessed to determine if screening should resume. Some women have medical problems that increase the chance of getting cervical cancer. In these cases, your health care provider may recommend more frequent screening and Pap tests. Colorectal Cancer  This type of cancer can be detected and often prevented.  Routine colorectal cancer screening usually begins at 67 years of age and continues through 67 years of age.  Your health care provider may recommend screening at an earlier age if you have risk factors for colon cancer.  Your health care provider may also recommend using home test kits to check for  hidden blood in the stool.  A small camera at the end of a tube can be used to examine your colon directly (sigmoidoscopy or colonoscopy). This is done to check for the earliest forms of colorectal cancer.  Routine screening usually begins at age 60.  Direct examination of the colon should be repeated every 5-10 years through 67 years of age. However, you may need to be screened more often if early forms of precancerous polyps or small growths are found. Skin Cancer  Check your skin from head to toe regularly.  Tell your health care provider about any new moles or changes in moles, especially if there is a change in a mole's shape or color.  Also tell your health care provider if you have a mole that is larger than the size of a pencil eraser.  Always use sunscreen. Apply sunscreen liberally and repeatedly throughout the day.  Protect yourself by wearing long sleeves, pants, a wide-brimmed hat, and sunglasses whenever you are outside. Heart disease,  diabetes, and high blood pressure  High blood pressure causes heart disease and increases the risk of stroke. High blood pressure is more likely to develop in:  People who have blood pressure in the high end of the normal range (130-139/85-89 mm Hg).  People who are overweight or obese.  People who are African American.  If you are 31-23 years of age, have your blood pressure checked every 3-5 years. If you are 59 years of age or older, have your blood pressure checked every year. You should have your blood pressure measured twice-once when you are at a hospital or clinic, and once when you are not at a hospital or clinic. Record the average of the two measurements. To check your blood pressure when you are not at a hospital or clinic, you can use:  An automated blood pressure machine at a pharmacy.  A home blood pressure monitor.  If you are between 25 years and 24 years old, ask your health care provider if you should take aspirin to  prevent strokes.  Have regular diabetes screenings. This involves taking a blood sample to check your fasting blood sugar level.  If you are at a normal weight and have a low risk for diabetes, have this test once every three years after 67 years of age.  If you are overweight and have a high risk for diabetes, consider being tested at a younger age or more often. Preventing infection Hepatitis B  If you have a higher risk for hepatitis B, you should be screened for this virus. You are considered at high risk for hepatitis B if:  You were born in a country where hepatitis B is common. Ask your health care provider which countries are considered high risk.  Your parents were born in a high-risk country, and you have not been immunized against hepatitis B (hepatitis B vaccine).  You have HIV or AIDS.  You use needles to inject street drugs.  You live with someone who has hepatitis B.  You have had sex with someone who has hepatitis B.  You get hemodialysis treatment.  You take certain medicines for conditions, including cancer, organ transplantation, and autoimmune conditions. Hepatitis C  Blood testing is recommended for:  Everyone born from [birth year] through 1967.  Anyone with known risk factors for hepatitis C. Sexually transmitted infections (STIs)  You should be screened for sexually transmitted infections (STIs) including gonorrhea and chlamydia if:  You are sexually active and are younger than 67 years of age.  You are older than 67 years of age and your health care provider tells you that you are at risk for this type of infection.  Your sexual activity has changed since you were last screened and you are at an increased risk for chlamydia or gonorrhea. Ask your health care provider if you are at risk.  If you do not have HIV, but are at risk, it may be recommended that you take a prescription medicine daily to prevent HIV infection. This is called pre-exposure  prophylaxis (PrEP). You are considered at risk if:  You are sexually active and do not regularly use condoms or know the HIV status of your partner(s).  You take drugs by injection.  You are sexually active with a partner who has HIV. Talk with your health care provider about whether you are at high risk of being infected with HIV. If you choose to begin PrEP, you should first be tested for HIV. You should then be tested  every 3 months for as long as you are taking PrEP. Pregnancy  If you are premenopausal and you may become pregnant, ask your health care provider about preconception counseling.  If you may become pregnant, take 400 to 800 micrograms (mcg) of folic acid every day.  If you want to prevent pregnancy, talk to your health care provider about birth control (contraception). Osteoporosis and menopause  Osteoporosis is a disease in which the bones lose minerals and strength with aging. This can result in serious bone fractures. Your risk for osteoporosis can be identified using a bone density scan.  If you are 40 years of age or older, or if you are at risk for osteoporosis and fractures, ask your health care provider if you should be screened.  Ask your health care provider whether you should take a calcium or vitamin D supplement to lower your risk for osteoporosis.  Menopause may have certain physical symptoms and risks.  Hormone replacement therapy may reduce some of these symptoms and risks. Talk to your health care provider about whether hormone replacement therapy is right for you. Follow these instructions at home:  Schedule regular health, dental, and eye exams.  Stay current with your immunizations.  Do not use any tobacco products including cigarettes, chewing tobacco, or electronic cigarettes.  If you are pregnant, do not drink alcohol.  If you are breastfeeding, limit how much and how often you drink alcohol.  Limit alcohol intake to no more than 1 drink  per day for nonpregnant women. One drink equals 12 ounces of beer, 5 ounces of wine, or 1 ounces of hard liquor.  Do not use street drugs.  Do not share needles.  Ask your health care provider for help if you need support or information about quitting drugs.  Tell your health care provider if you often feel depressed.  Tell your health care provider if you have ever been abused or do not feel safe at home. This information is not intended to replace advice given to you by your health care provider. Make sure you discuss any questions you have with your health care provider. Document Released: 11/13/2010 Document Revised: 10/06/2015 Document Reviewed: 02/01/2015  2017 [employer]vier

## 2016-05-01 NOTE — Progress Notes (Signed)
Subjective:    Patient ID: Erin Hatfield, female    DOB: [birth year], 65 y.o.   MRN: [id number]  HPI  Patient presents for yearly preventative medicine examination. She is a pleasant 65 year old female who  has a past medical history of Allergy; Bunion, left foot; Diabetes mellitus without complication ([place]); Hot flashes; and Hypertension.  All immunizations and health maintenance protocols were reviewed with the patient and needed orders were placed.   Medication reconciliation,  past medical history, social history, problem list and allergies were reviewed in detail with the patient  Goals were established with regard to weight loss, exercise, and  diet in compliance with medications. She follows a heart healthy diet and continues to walk every day at the mall for 45 minutes  End of life planning was discussed.  She is up to date on her colonoscopy, bone density, dental and vision exams.   Review of Systems  Constitutional: Negative.   HENT: Negative.   Eyes: Negative.   Respiratory: Negative.   Cardiovascular: Negative.   Gastrointestinal: Negative.   Endocrine: Negative.   Musculoskeletal: Positive for myalgias.  Skin: Negative.   Allergic/Immunologic: Negative.   Neurological: Negative.   Hematological: Negative.   Psychiatric/Behavioral: Negative.    Past Medical History:  Diagnosis Date  . Allergy   . Bunion, left foot   . Diabetes mellitus without complication ([place])   . Hot flashes   . Hypertension     Social History   Social History  . Marital status: Married    Spouse name: N/A  . Number of children: N/A  . Years of education: N/A   Occupational History  . Not on file.   Social History Main Topics  . Smoking status: Never Smoker  . Smokeless tobacco: Never Used  . Alcohol use No  . Drug use: No  . Sexual activity: Not on file   Other Topics Concern  . 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 ABDOMINAL HYSTERECTOMY      Family History  Problem Relation Age of Onset  . Diabetes Other   . Hypertension Other   . Diabetes Sister   . Diabetes Sister   . Colon cancer Neg Hx   . Breast cancer Mother   . Breast cancer Other   . Lung cancer Father     Smoker    No Known Allergies  Current Outpatient Prescriptions on File Prior to Visit  Medication Sig Dispense Refill  . amitriptyline (ELAVIL) 25 MG tablet Take 1 tablet (25 mg total) by mouth at bedtime. 100 tablet 3  . aspirin 81 MG tablet Take 81 mg by mouth daily.      Marland Kitchen atenolol (TENORMIN) 50 MG tablet Take 1 tablet (50 mg total) by mouth daily. 100 tablet 3  . cetirizine (ZYRTEC) 10 MG tablet Take 10 mg by mouth as needed.     . cholecalciferol (VITAMIN D) 1000 UNITS tablet Take 1,000 Units by mouth daily.      . Chromium-Cinnamon (CINNAMON PLUS CHROMIUM) 100-500 MCG-MG CAPS Take by mouth 2 (two) times daily.    . fish oil-omega-3 fatty acids 1000 MG capsule Take 1 g by mouth daily.      Marland Kitchen glucose  blood (ONETOUCH VERIO) test strip Test once daily Dx E11.9 100 each 12  . metFORMIN (GLUCOPHAGE) 500 MG tablet Take 1 tablet (500 mg total) by mouth 2 (two) times daily with a meal. 180 tablet 4  . Multiple Vitamin (MULTIVITAMIN) tablet Take 1 tablet by mouth daily.    . ONE TOUCH LANCETS MISC 1 each by Does not apply route daily. 200 each 3  . simvastatin (ZOCOR) 10 MG tablet TAKE ONE TABLET BY MOUTH AT BEDTIME 90 tablet 0   No current facility-administered medications on file prior to visit.     BP (!) 158/90   Temp 98.2 F (36.8 C) (Oral)   Ht 5\' 4"  (1.626 m)   Wt 178 lb 11.2 oz (81.1 kg)   BMI 30.67 kg/m       Objective:   Physical Exam  Constitutional: She is oriented to person, place, and time. She appears well-developed and  well-nourished. No distress.  HENT:  Head: Normocephalic and atraumatic.  Right Ear: External ear normal.  Left Ear: External ear normal.  Nose: Nose normal.  Mouth/Throat: Oropharynx is clear and moist. No oropharyngeal exudate.  Eyes: Conjunctivae and EOM are normal. Pupils are equal, round, and reactive to light. Right eye exhibits no discharge. Left eye exhibits no discharge. No scleral icterus.  Neck: Normal range of motion. Neck supple. No JVD present. Carotid bruit is not present. No tracheal deviation present. No thyromegaly present.  Cardiovascular: Normal rate, regular rhythm, normal heart sounds and intact distal pulses.  Exam reveals no gallop and no friction rub.   No murmur heard. Pulmonary/Chest: Effort normal and breath sounds normal. No stridor. No respiratory distress. She has no wheezes. She has no rales. She exhibits no tenderness.  Abdominal: Soft. Bowel sounds are normal. She exhibits no distension and no mass. There is no tenderness. There is no rebound and no guarding.  Genitourinary:  Genitourinary Comments: Breast Exam: No masses, lumps, dimpling or discharge   Musculoskeletal: Normal range of motion. She exhibits no edema, tenderness or deformity.  Lymphadenopathy:    She has no cervical adenopathy.  Neurological: She is alert and oriented to person, place, and time. She has normal reflexes. She displays normal reflexes. No cranial nerve deficit. She exhibits normal muscle tone. Coordination normal.  Skin: Skin is warm and dry. No rash noted. No erythema. No pallor.  Psychiatric: She has a normal mood and affect. Her behavior is normal. Judgment and thought content normal.  Nursing note and vitals reviewed.     Assessment & Plan:  1. Routine general medical examination at a health care facility - Reviewed labs in detail with patient. All questions answered - Follow up in one year or sooner if needed - Continue to exercise and eat healthy.   2. Essential  hypertension  - EKG 12-Lead - atenolol (TENORMIN) 50 MG tablet; Take 1 tablet (50 mg total) by mouth daily.  Dispense: 100 tablet; Refill: 3  3. Need for prophylactic vaccination and inoculation against influenza  - Flu vaccine HIGH DOSE PF (Fluzone High dose)  4. 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 Advised to work on diet and exercise.  - No change at this time - metFORMIN (GLUCOPHAGE) 500 MG tablet; Take 1 tablet (500 mg total) by mouth 2 (two) times daily with a meal.  Dispense: 180 tablet; Refill: 4 - amitriptyline (ELAVIL) 25 MG tablet; Take 1  tablet (25 mg total) by mouth at bedtime.  Dispense: 100 tablet; Refill: 3  5. Diabetic neuropathy, painful (HCC)  - amitriptyline (ELAVIL) 25 MG tablet; Take 1 tablet (25 mg total) by mouth at bedtime.  Dispense: 100 tablet; Refill: 3  [name], NP

## 2016-07-23 ENCOUNTER — Other Ambulatory Visit: Payer: Self-pay | Admitting: Adult Health

## 2016-07-23 DIAGNOSIS — Z1231 Encounter for screening mammogram for malignant neoplasm of breast: Secondary | ICD-10-CM

## 2016-09-03 ENCOUNTER — Ambulatory Visit
Admission: RE | Admit: 2016-09-03 | Discharge: 2016-09-03 | Disposition: A | Discharge status: Home / Self Care | Payer: Medicare Other | Source: Ambulatory Visit | Attending: Adult Health | Admitting: Adult Health

## 2016-09-03 DIAGNOSIS — Z1231 Encounter for screening mammogram for malignant neoplasm of breast: Secondary | ICD-10-CM

## 2016-10-16 ENCOUNTER — Encounter: Payer: Self-pay | Admitting: Adult Health

## 2016-10-16 ENCOUNTER — Ambulatory Visit (INDEPENDENT_AMBULATORY_CARE_PROVIDER_SITE_OTHER): Payer: Medicare Other | Admitting: Adult Health

## 2016-10-16 VITALS — BP 132/74 | Ht 64.0 in | Wt 178.2 lb

## 2016-10-16 DIAGNOSIS — E109 Type 1 diabetes mellitus without complications: Secondary | ICD-10-CM

## 2016-10-16 DIAGNOSIS — I10 Essential (primary) hypertension: Secondary | ICD-10-CM

## 2016-10-16 DIAGNOSIS — E139 Other specified diabetes mellitus without complications: Secondary | ICD-10-CM

## 2016-10-16 LAB — POCT GLYCOSYLATED HEMOGLOBIN (HGB A1C): HEMOGLOBIN A1C: 6.6

## 2016-10-16 NOTE — Progress Notes (Signed)
Subjective:    Patient ID: Erin Hatfield, female    DOB: 01-25-1951, 66 y.o.   MRN: [id number]  HPI  66 year old female who  has a past medical history of Allergy; Bunion, left foot; Diabetes mellitus without complication ([place]); Hot flashes; and Hypertension. She presents to the office today for six month follow up regarding diabetes and hypertension   Her last A1c 6 months ago was 6.9. She is currently controlled on metformin 500mg  BID. She checks her blood sugars at home and reports consistent readings between 100-160.   Her blood pressure is controlled with Atenolol 50mg  daily   Today in the office she reports that she is walking every morning and is watching her diet    Review of Systems  Constitutional: Negative.   Respiratory: Negative.   Cardiovascular: Negative.   Musculoskeletal: Negative.   All other systems reviewed and are negative.  Past Medical History:  Diagnosis Date  . Allergy   . Bunion, left foot   . Diabetes mellitus without complication ([place])   . Hot flashes   . Hypertension     Social History   Social History  . Marital status: Married    Spouse name: N/A  . Number of children: N/A  . Years of education: N/A   Occupational History  . Not on file.   Social History Main Topics  . Smoking status: Never Smoker  . Smokeless tobacco: Never Used  . Alcohol use No  . Drug use: No  . Sexual activity: Not on file   Other Topics Concern  . 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 ABDOMINAL HYSTERECTOMY      Family History  Problem Relation Age of Onset  . Diabetes Sister   . Breast cancer Mother   . Lung cancer Father        Smoker  . Diabetes Other   . Hypertension Other   . Diabetes Sister   . Breast cancer  Other   . Colon cancer Neg Hx     No Known Allergies  Current Outpatient Prescriptions on File Prior to Visit  Medication Sig Dispense Refill  . amitriptyline (ELAVIL) 25 MG tablet Take 1 tablet (25 mg total) by mouth at bedtime. 100 tablet 3  . aspirin 81 MG tablet Take 81 mg by mouth daily.      Marland Kitchen atenolol (TENORMIN) 50 MG tablet Take 1 tablet (50 mg total) by mouth daily. 100 tablet 3  . cetirizine (ZYRTEC) 10 MG tablet Take 10 mg by mouth as needed.     . cholecalciferol (VITAMIN D) 1000 UNITS tablet Take 1,000 Units by mouth daily.      . Chromium-Cinnamon (CINNAMON PLUS CHROMIUM) 100-500 MCG-MG CAPS Take by mouth 2 (two) times daily.    . fish oil-omega-3 fatty acids 1000 MG capsule Take 1 g by mouth daily.      Marland Kitchen glucose blood (ONETOUCH VERIO) test strip Test once daily Dx E11.9 100 each 12  . metFORMIN (GLUCOPHAGE) 500 MG tablet Take 1 tablet (500 mg total) by mouth 2 (two) times daily with a meal. 180 tablet 4  . Multiple Vitamin (MULTIVITAMIN) tablet Take 1 tablet by mouth daily.    . ONE TOUCH  LANCETS MISC 1 each by Does not apply route daily. 200 each 3  . simvastatin (ZOCOR) 10 MG tablet Take 1/2 tablet at bedtime 90 tablet 3   No current facility-administered medications on file prior to visit.     BP 132/74 (BP Location: Left Arm, Patient Position: Sitting, Cuff Size: Normal)   Ht 5\' 4"  (1.626 m)   Wt 178 lb 3.2 oz (80.8 kg)   BMI 30.59 kg/m       Objective:   Physical Exam  Constitutional: She is oriented to person, place, and time. She appears well-developed and well-nourished. No distress.  Cardiovascular: Normal rate, regular rhythm, normal heart sounds and intact distal pulses.  Exam reveals no gallop and no friction rub.   No murmur heard. Pulmonary/Chest: Breath sounds normal. No respiratory distress. She has no wheezes. She has no rales. She exhibits no tenderness.  Neurological: She is alert and oriented to person, place, and time.  Skin: Skin is warm  and dry. No rash noted. She is not diaphoretic. No erythema. No pallor.  Psychiatric: She has a normal mood and affect. Her behavior is normal. Judgment and thought content normal.  Vitals reviewed.     Assessment & Plan:  1. Diabetes 1.5, managed as type 2 ([place])  - POC HgB A1c- has improved to 6.6  - Continue to monitor  - Continue walking and diabetic diet - Follow up with at 6 month interval during CPE    3. Essential hypertension - Controlled.  - No change in medications  [name], NP

## 2017-05-02 ENCOUNTER — Ambulatory Visit (INDEPENDENT_AMBULATORY_CARE_PROVIDER_SITE_OTHER): Payer: Medicare Other | Admitting: Adult Health

## 2017-05-02 ENCOUNTER — Encounter: Payer: Self-pay | Admitting: Adult Health

## 2017-05-02 VITALS — BP 150/82 | Temp 98.0°F | Ht 63.5 in | Wt 178.0 lb

## 2017-05-02 DIAGNOSIS — I10 Essential (primary) hypertension: Secondary | ICD-10-CM | POA: Diagnosis not present

## 2017-05-02 DIAGNOSIS — Z0000 Encounter for general adult medical examination without abnormal findings: Secondary | ICD-10-CM

## 2017-05-02 DIAGNOSIS — E559 Vitamin D deficiency, unspecified: Secondary | ICD-10-CM | POA: Diagnosis not present

## 2017-05-02 DIAGNOSIS — E1140 Type 2 diabetes mellitus with diabetic neuropathy, unspecified: Secondary | ICD-10-CM

## 2017-05-02 DIAGNOSIS — E139 Other specified diabetes mellitus without complications: Secondary | ICD-10-CM

## 2017-05-02 DIAGNOSIS — Z23 Encounter for immunization: Secondary | ICD-10-CM

## 2017-05-02 DIAGNOSIS — E109 Type 1 diabetes mellitus without complications: Secondary | ICD-10-CM

## 2017-05-02 LAB — LIPID PANEL
CHOL/HDL RATIO: 4
Cholesterol: 158 mg/dL (ref 0–200)
HDL: 40.7 mg/dL (ref >39.00)
NONHDL: 117.12
TRIGLYCERIDES: 298 mg/dL — AB (ref 0.0–149.0)
VLDL: 59.6 mg/dL — AB (ref 0.0–40.0)

## 2017-05-02 LAB — CBC WITH DIFFERENTIAL/PLATELET
BASOS ABS: 0.1 10*3/uL (ref 0.0–0.1)
BASOS PCT: 1.1 % (ref 0.0–3.0)
Eosinophils Absolute: 0.2 10*3/uL (ref 0.0–0.7)
Eosinophils Relative: 2.4 % (ref 0.0–5.0)
HEMATOCRIT: 38.3 % (ref 36.0–46.0)
HEMOGLOBIN: 12.1 g/dL (ref 12.0–15.0)
LYMPHS PCT: 48.9 % — AB (ref 12.0–46.0)
Lymphs Abs: 4.1 10*3/uL — ABNORMAL HIGH (ref 0.7–4.0)
MCHC: 31.6 g/dL (ref 30.0–36.0)
MCV: 85.7 fl (ref 78.0–100.0)
MONOS PCT: 6.9 % (ref 3.0–12.0)
Monocytes Absolute: 0.6 10*3/uL (ref 0.1–1.0)
NEUTROS ABS: 3.4 10*3/uL (ref 1.4–7.7)
Neutrophils Relative %: 40.7 % — ABNORMAL LOW (ref 43.0–77.0)
Platelets: 365 10*3/uL (ref 150.0–400.0)
RBC: 4.47 Mil/uL (ref 3.87–5.11)
RDW: 12.4 % (ref 11.5–15.5)
WBC: 8.3 10*3/uL (ref 4.0–10.5)

## 2017-05-02 LAB — LDL CHOLESTEROL, DIRECT: LDL DIRECT: 88 mg/dL

## 2017-05-02 LAB — MICROALBUMIN / CREATININE URINE RATIO
CREATININE, U: 108.3 mg/dL
MICROALB/CREAT RATIO: 0.6 mg/g (ref 0.0–30.0)
Microalb, Ur: <0.7 mg/dL (ref 0.0–1.9)

## 2017-05-02 LAB — VITAMIN D 25 HYDROXY (VIT D DEFICIENCY, FRACTURES): VITD: 40.31 ng/mL (ref 30.00–100.00)

## 2017-05-02 LAB — HEMOGLOBIN A1C: Hgb A1c MFr Bld: 7.1 % — ABNORMAL HIGH (ref 4.6–6.5)

## 2017-05-02 LAB — TSH: TSH: 3.91 u[IU]/mL (ref 0.35–4.50)

## 2017-05-02 MED ORDER — METFORMIN HCL 500 MG PO TABS
500.0000 mg | ORAL_TABLET | Freq: Two times a day (BID) | ORAL | 3 refills | Status: DC
Start: 2017-05-02 — End: 2018-07-15

## 2017-05-02 MED ORDER — ATENOLOL 50 MG PO TABS: 50.0000 mg | ORAL_TABLET | Freq: Every day | ORAL | 3 refills | Status: DC

## 2017-05-02 MED ORDER — AMITRIPTYLINE HCL 50 MG PO TABS: 50.0000 mg | ORAL_TABLET | Freq: Every day | ORAL | 3 refills | Status: DC

## 2017-05-02 NOTE — Progress Notes (Signed)
Subjective:    Patient ID: Erin Hatfield, female    DOB: 1950/08/25, 66 y.o.   MRN: [id number]  HPI  Patient presents for yearly preventative medicine examination. She is a pleasant 66 year old female who  has a past medical history of Allergy, Bunion, left foot, Diabetes mellitus without complication ([place]), Hot flashes, and Hypertension.  Diabetes mellitus- she takes metformin 500 mg twice daily.  She also takes Elavil for diabetic neuropathy Lab Results  Component Value Date   HGBA1C 6.6 10/16/2016   Per lipidemia-she takes Zocor 10 mg - stable   Hypertension - she takes Atenolol 50 mg daily - stable . She did not take her blood pressure medication this morning. She reports readings at home in the 120-130/80's.  BP Readings from Last 3 Encounters:  05/02/17 (!) 150/82  10/16/16 132/74  05/01/16 (!) 158/90    All immunizations and health maintenance protocols were reviewed with the patient and needed orders were placed. She is due for her flu vaccination   Appropriate screening laboratory values were ordered for the patient including screening of hyperlipidemia, renal function and hepatic function.  Medication reconciliation,  past medical history, social history, problem list and allergies were reviewed in detail with the patient  Goals were established with regard to weight loss, exercise, and  diet in compliance with medications. She is exercising daily and is eating healthy.   End of life planning was discussed. She does not have an advanced directive or living will   She is up-to-date on her mammogram and colonoscopy. She has had her vision and dental screen done this year. She does home breast exams   Review of Systems  Constitutional: Negative.   HENT: Negative.   Eyes: Negative.   Respiratory: Negative.   Cardiovascular: Negative.   Gastrointestinal: Negative.   Endocrine: Negative.   Genitourinary: Negative.   Musculoskeletal: Positive for arthralgias.    Skin: Negative.   Allergic/Immunologic: Negative.   Neurological: Positive for numbness.  Hematological: Negative.   Psychiatric/Behavioral: Negative.    Past Medical History:  Diagnosis Date  . Allergy   . Bunion, left foot   . Diabetes mellitus without complication ([place])   . Hot flashes   . Hypertension     Social History   Socioeconomic History  . Marital status: Married    Spouse name: Not on file  . Number of children: Not on file  . Years of education: Not on file  . Highest education level: Not on file  Social Needs  . Financial resource strain: Not on file  . Food insecurity - worry: Not on file  . Food insecurity - inability: Not on file  . Transportation needs - medical: Not on file  . Transportation needs - non-medical: Not on file  Occupational History  . Not on file  Tobacco Use  . Smoking status: Never Smoker  . Smokeless tobacco: Never Used  Substance and Sexual Activity  . Alcohol use: No    Alcohol/week: 0.0 oz  . Drug use: No  . Sexual activity: Not on file  Other Topics Concern  . 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 ABDOMINAL HYSTERECTOMY      Family History  Problem Relation Age of  Onset  . Diabetes Sister   . Breast cancer Mother   . Lung cancer Father        Smoker  . Diabetes Other   . Hypertension Other   . Diabetes Sister   . Breast cancer Other   . Colon cancer Neg Hx     No Known Allergies  Current Outpatient Medications on File Prior to Visit  Medication Sig Dispense Refill  . amitriptyline (ELAVIL) 25 MG tablet Take 1 tablet (25 mg total) by mouth at bedtime. 100 tablet 3  . aspirin 81 MG tablet Take 81 mg by mouth daily.      Marland Kitchen atenolol (TENORMIN) 50 MG tablet Take 1 tablet (50 mg total) by  mouth daily. 100 tablet 3  . cetirizine (ZYRTEC) 10 MG tablet Take 10 mg by mouth as needed.     . cholecalciferol (VITAMIN D) 1000 UNITS tablet Take 1,000 Units by mouth daily.      . Chromium-Cinnamon (CINNAMON PLUS CHROMIUM) 100-500 MCG-MG CAPS Take by mouth 2 (two) times daily.    . fish oil-omega-3 fatty acids 1000 MG capsule Take 1 g by mouth daily.      Marland Kitchen glucose blood (ONETOUCH VERIO) test strip Test once daily Dx E11.9 100 each 12  . metFORMIN (GLUCOPHAGE) 500 MG tablet Take 1 tablet (500 mg total) by mouth 2 (two) times daily with a meal. 180 tablet 4  . Multiple Vitamin (MULTIVITAMIN) tablet Take 1 tablet by mouth daily.    . ONE TOUCH LANCETS MISC 1 each by Does not apply route daily. 200 each 3  . simvastatin (ZOCOR) 10 MG tablet Take 1/2 tablet at bedtime 90 tablet 3   No current facility-administered medications on file prior to visit.     BP (!) 150/82 (BP Location: Left Arm)   Temp 98 F (36.7 C) (Oral)   Ht 5' 3.5" (1.613 m)   Wt 178 lb (80.7 kg)   BMI 31.04 kg/m       Objective:   Physical Exam  Constitutional: She is oriented to person, place, and time. She appears well-developed and well-nourished. No distress.  HENT:  Head: Normocephalic and atraumatic.  Right Ear: External ear normal.  Left Ear: External ear normal.  Nose: Nose normal.  Mouth/Throat: Oropharynx is clear and moist. No oropharyngeal exudate.  Eyes: Conjunctivae and EOM are normal. Pupils are equal, round, and reactive to light. Right eye exhibits no discharge. Left eye exhibits no discharge. No scleral icterus.  Neck: Normal range of motion. Neck supple. No tracheal deviation present. No thyromegaly present.  Cardiovascular: Normal rate, regular rhythm, normal heart sounds and intact distal pulses. Exam reveals no gallop and no friction rub.  No murmur heard. Pulmonary/Chest: Effort normal and breath sounds normal. No stridor. No respiratory distress. She has no wheezes. She has no rales. She  exhibits no tenderness.  Abdominal: Soft. Bowel sounds are normal. She exhibits no distension and no mass. There is no tenderness. There is no rebound and no guarding.  Musculoskeletal: Normal range of motion. She exhibits no edema, tenderness or deformity.  Lymphadenopathy:    She has no cervical adenopathy.  Neurological: She is alert and oriented to person, place, and time. She has normal reflexes. No cranial nerve deficit. Coordination normal.  Skin: Skin is warm and dry. No rash noted. No erythema. No pallor.  Psychiatric: She has a normal mood and affect. Her behavior is normal. Judgment and thought content normal.  Nursing note and vitals reviewed.  Assessment & Plan:  1. Routine general medical examination at a health care facility  - CBC with Differential/Platelet - Hemoglobin A1c - Lipid panel - TSH - Microalbumin/Creatinine Ratio, Urine  2. Diabetes 1.5, managed as type 2 ([place]) - Continue to eat healthy and exercise  - metFORMIN (GLUCOPHAGE) 500 MG tablet; Take 1 tablet (500 mg total) by mouth 2 (two) times daily with a meal.  Dispense: 180 tablet; Refill: 3 - amitriptyline (ELAVIL) 50 MG tablet; Take 1 tablet (50 mg total) by mouth at bedtime.  Dispense: 90 tablet; Refill: 3 - CBC with Differential/Platelet - Hemoglobin A1c - Lipid panel - TSH - Microalbumin/Creatinine Ratio, Urine  3. Diabetic neuropathy, painful ([facility])  - amitriptyline (ELAVIL) 50 MG tablet; Take 1 tablet (50 mg total) by mouth at bedtime.  Dispense: 90 tablet; Refill: 3  4. Essential hypertension - Not at goal today. Did not take medication. No change  - CBC with Differential/Platelet - Hemoglobin A1c - Lipid panel - TSH - atenolol (TENORMIN) 50 MG tablet; Take 1 tablet (50 mg total) by mouth daily.  Dispense: 90 tablet; Refill: 3  5. Vitamin D deficiency  - Vitamin D, 25-hydroxy  6. Need for influenza vaccination  - Flu vaccine HIGH DOSE PF (Fluzone High dose)  [name], NP

## 2017-05-02 NOTE — Patient Instructions (Signed)
It was great seeing you today!   All of your medications have been sent in   I will follow up with you regarding your blood work   Continue to do what you are doing

## 2017-05-08 ENCOUNTER — Other Ambulatory Visit: Payer: Self-pay | Admitting: Adult Health

## 2017-05-09 ENCOUNTER — Telehealth: Payer: Self-pay | Admitting: Adult Health

## 2017-05-09 MED ORDER — GLUCOSE BLOOD VI STRP: ORAL_STRIP | 12 refills | Status: DC

## 2017-05-09 MED ORDER — SIMVASTATIN 10 MG PO TABS: ORAL_TABLET | ORAL | 1 refills | Status: DC

## 2017-05-09 NOTE — Telephone Encounter (Signed)
Sent to the pharmacy by e-scribe. 

## 2017-05-09 NOTE — Telephone Encounter (Signed)
Copied from [place] [id number]. Topic: Quick Communication - See Telephone Encounter >> May 09, 2017  9:44 AM Cleaster Corin, NT wrote: CRM for notification. See Telephone encounter for:   05/09/17.  Pt. Calling to get refill on (zocar 10mg  and one touch veno test strips) pt. Can be reached at [facility] ([facility]), [place] [address] [place] [address] Phone: [phone] Fax: [phone]

## 2017-07-30 ENCOUNTER — Other Ambulatory Visit: Payer: Self-pay | Admitting: Adult Health

## 2017-07-30 DIAGNOSIS — Z1231 Encounter for screening mammogram for malignant neoplasm of breast: Secondary | ICD-10-CM

## 2017-08-06 ENCOUNTER — Encounter: Payer: Self-pay | Admitting: Adult Health

## 2017-08-06 ENCOUNTER — Ambulatory Visit: Payer: Medicare Other | Admitting: Adult Health

## 2017-08-06 VITALS — BP 144/80 | Temp 98.3°F | Wt 176.0 lb

## 2017-08-06 DIAGNOSIS — E109 Type 1 diabetes mellitus without complications: Secondary | ICD-10-CM | POA: Diagnosis not present

## 2017-08-06 DIAGNOSIS — E139 Other specified diabetes mellitus without complications: Secondary | ICD-10-CM

## 2017-08-06 LAB — POCT GLYCOSYLATED HEMOGLOBIN (HGB A1C): HEMOGLOBIN A1C: 6.6

## 2017-08-06 NOTE — Progress Notes (Signed)
Subjective:    Patient ID: Erin Hatfield, female    DOB: 28-Dec-1950, 67 y.o.   MRN: [id number]  HPI 67 year old female who  has a past medical history of Allergy, Bunion, left foot, Diabetes mellitus without complication ([place]), Hot flashes, and Hypertension.  She presents to the office today for follow-up regarding diabetes.  He is currently prescribed metformin 500 mg twice daily.  During her last visit A1c had increased from 6.6 to 7.1.   Today in the office she reports that her blood sugars have been consistently in the 120's at home. She has been walking more and drinking more water.   She denies any acute complaints.   Review of Systems See HPI   Past Medical History:  Diagnosis Date  . Allergy   . Bunion, left foot   . Diabetes mellitus without complication ([place])   . Hot flashes   . Hypertension     Social History   Socioeconomic History  . Marital status: Married    Spouse name: Not on file  . Number of children: Not on file  . Years of education: Not on file  . Highest education level: Not on file  Occupational History  . Not on file  Social Needs  . Financial resource strain: Not on file  . Food insecurity:    Worry: Not on file    Inability: Not on file  . Transportation needs:    Medical: Not on file    Non-medical: Not on file  Tobacco Use  . Smoking status: Never Smoker  . Smokeless tobacco: Never Used  Substance and Sexual Activity  . Alcohol use: No    Alcohol/week: 0.0 oz  . Drug use: No  . Sexual activity: Not on file  Lifestyle  . Physical activity:    Days per week: Not on file    Minutes per session: Not on file  . Stress: Not on file  Relationships  . Social connections:    Talks on phone: Not on file    Gets together: Not on file    Attends religious service: Not on file    Active member of club or organization: Not on file    Attends meetings of clubs or organizations: Not on file    Relationship status: Not on file  .  Intimate partner violence:    Fear of current or ex partner: Not on file    Emotionally abused: Not on file    Physically abused: Not on file    Forced sexual activity: Not on file  Other Topics Concern  . 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 ABDOMINAL HYSTERECTOMY      Family History  Problem Relation Age of Onset  . Diabetes Sister   . Breast cancer Mother   . Lung cancer Father        Smoker  . Diabetes Other   . Hypertension Other   . Diabetes Sister   . Breast cancer Other   . Colon cancer Neg Hx     No Known Allergies  Current Outpatient Medications on File Prior to Visit  Medication Sig Dispense Refill  . amitriptyline (ELAVIL) 50 MG tablet Take 1 tablet (50 mg total) by  mouth at bedtime. 90 tablet 3  . aspirin 81 MG tablet Take 81 mg by mouth daily.      Marland Kitchen atenolol (TENORMIN) 50 MG tablet Take 1 tablet (50 mg total) by mouth daily. 90 tablet 3  . cetirizine (ZYRTEC) 10 MG tablet Take 10 mg by mouth as needed.     . cholecalciferol (VITAMIN D) 1000 UNITS tablet Take 1,000 Units by mouth daily.      . Chromium-Cinnamon (CINNAMON PLUS CHROMIUM) 100-500 MCG-MG CAPS Take by mouth 2 (two) times daily.    . fish oil-omega-3 fatty acids 1000 MG capsule Take 1 g by mouth daily.      Marland Kitchen glucose blood (ONETOUCH VERIO) test strip Test once daily Dx E11.9 100 each 12  . metFORMIN (GLUCOPHAGE) 500 MG tablet Take 1 tablet (500 mg total) by mouth 2 (two) times daily with a meal. 180 tablet 3  . Multiple Vitamin (MULTIVITAMIN) tablet Take 1 tablet by mouth daily.    . ONE TOUCH LANCETS MISC 1 each by Does not apply route daily. 200 each 3  . simvastatin (ZOCOR) 10 MG tablet Take 1/2 tablet at bedtime 90 tablet 1   No current facility-administered  medications on file prior to visit.     BP (!) 144/80 (BP Location: Left Arm)   Temp 98.3 F (36.8 C) (Oral)   Wt 176 lb (79.8 kg)   BMI 30.69 kg/m       Objective:   Physical Exam  Constitutional: She is oriented to person, place, and time. She appears well-developed and well-nourished. No distress.  Cardiovascular: Normal rate, regular rhythm, normal heart sounds and intact distal pulses. Exam reveals no gallop and no friction rub.  No murmur heard. Pulmonary/Chest: Effort normal and breath sounds normal. No respiratory distress. She has no wheezes. She has no rales. She exhibits no tenderness.  Neurological: She is alert and oriented to person, place, and time.  Skin: Skin is warm and dry. No rash noted. She is not diaphoretic. No erythema. No pallor.  Psychiatric: She has a normal mood and affect. Her behavior is normal. Judgment and thought content normal.  Nursing note and vitals reviewed.     Assessment & Plan:  1. Diabetes 1.5, managed as type 2 ([place]) - POC HgB A1c- 6.6 - has improved  - Continue walking and drinking water. Work on diabetic diet.  - Follow up in 6 months or sooner if needed  [name], NP

## 2017-08-27 ENCOUNTER — Ambulatory Visit: Payer: Medicare Other | Admitting: Adult Health

## 2017-08-27 ENCOUNTER — Encounter: Payer: Self-pay | Admitting: Adult Health

## 2017-08-27 VITALS — BP 178/82 | Temp 98.8°F | Wt 176.0 lb

## 2017-08-27 DIAGNOSIS — J40 Bronchitis, not specified as acute or chronic: Secondary | ICD-10-CM

## 2017-08-27 MED ORDER — PREDNISONE 10 MG PO TABS: ORAL_TABLET | ORAL | 0 refills | Status: DC

## 2017-08-27 MED ORDER — HYDROCODONE-HOMATROPINE 5-1.5 MG/5ML PO SYRP
5.0000 mL | ORAL_SOLUTION | Freq: Three times a day (TID) | ORAL | 0 refills | Status: DC | PRN
Start: 2017-08-27 — End: 2018-01-31

## 2017-08-27 NOTE — Progress Notes (Signed)
Subjective:    Patient ID: Erin Hatfield, female    DOB: 1950-11-17, 67 y.o.   MRN: [id number]  URI   The current episode started in the past 7 days (5 days ago ). There has been no fever. Associated symptoms include coughing and wheezing. Pertinent negatives include no dysuria, ear pain, rhinorrhea, sinus pain or sore throat. Treatments tried: Zyrtec and Mucinex  The treatment provided mild relief.    Review of Systems  Constitutional: Negative.   HENT: Negative for ear pain, rhinorrhea, sinus pain, sore throat and trouble swallowing.   Respiratory: Positive for cough, chest tightness and wheezing.   Cardiovascular: Negative.   Gastrointestinal: Negative.   Genitourinary: Negative for dysuria.  Musculoskeletal: Negative.   All other systems reviewed and are negative.  Past Medical History:  Diagnosis Date  . Allergy   . Bunion, left foot   . Diabetes mellitus without complication ([place])   . Hot flashes   . Hypertension     Social History   Socioeconomic History  . Marital status: Married    Spouse name: Not on file  . Number of children: Not on file  . Years of education: Not on file  . Highest education level: Not on file  Occupational History  . Not on file  Social Needs  . Financial resource strain: Not on file  . Food insecurity:    Worry: Not on file    Inability: Not on file  . Transportation needs:    Medical: Not on file    Non-medical: Not on file  Tobacco Use  . Smoking status: Never Smoker  . Smokeless tobacco: Never Used  Substance and Sexual Activity  . Alcohol use: No    Alcohol/week: 0.0 oz  . Drug use: No  . Sexual activity: Not on file  Lifestyle  . Physical activity:    Days per week: Not on file    Minutes per session: Not on file  . Stress: Not on file  Relationships  . Social connections:    Talks on phone: Not on file    Gets together: Not on file    Attends religious service: Not on file    Active member of club or  organization: Not on file    Attends meetings of clubs or organizations: Not on file    Relationship status: Not on file  . Intimate partner violence:    Fear of current or ex partner: Not on file    Emotionally abused: Not on file    Physically abused: Not on file    Forced sexual activity: Not on file  Other Topics Concern  . 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 ABDOMINAL HYSTERECTOMY      Family History  Problem Relation Age of Onset  . Diabetes Sister   . Breast cancer Mother   . Lung cancer Father        Smoker  . Diabetes Other   . Hypertension Other   . Diabetes Sister   . Breast cancer Other   . Colon cancer Neg Hx     No Known Allergies  Current Outpatient Medications on File Prior to Visit  Medication Sig Dispense Refill  . amitriptyline (ELAVIL) 50 MG  tablet Take 1 tablet (50 mg total) by mouth at bedtime. 90 tablet 3  . aspirin 81 MG tablet Take 81 mg by mouth daily.      Marland Kitchen atenolol (TENORMIN) 50 MG tablet Take 1 tablet (50 mg total) by mouth daily. 90 tablet 3  . cetirizine (ZYRTEC) 10 MG tablet Take 10 mg by mouth as needed.     . cholecalciferol (VITAMIN D) 1000 UNITS tablet Take 1,000 Units by mouth daily.      . Chromium-Cinnamon (CINNAMON PLUS CHROMIUM) 100-500 MCG-MG CAPS Take by mouth 2 (two) times daily.    . fish oil-omega-3 fatty acids 1000 MG capsule Take 1 g by mouth daily.      Marland Kitchen glucose blood (ONETOUCH VERIO) test strip Test once daily Dx E11.9 100 each 12  . metFORMIN (GLUCOPHAGE) 500 MG tablet Take 1 tablet (500 mg total) by mouth 2 (two) times daily with a meal. 180 tablet 3  . Multiple Vitamin (MULTIVITAMIN) tablet Take 1 tablet by mouth daily.    . ONE TOUCH LANCETS MISC 1 each by Does not apply route daily. 200  each 3  . simvastatin (ZOCOR) 10 MG tablet Take 1/2 tablet at bedtime 90 tablet 1   No current facility-administered medications on file prior to visit.     BP (!) 178/82 (BP Location: Left Arm)   Temp 98.8 F (37.1 C) (Oral)   Wt 176 lb (79.8 kg)   BMI 30.69 kg/m       Objective:   Physical Exam  Constitutional: She is oriented to person, place, and time. She appears well-developed and well-nourished. No distress.  Cardiovascular: Normal rate, regular rhythm, normal heart sounds and intact distal pulses. Exam reveals no gallop and no friction rub.  No murmur heard. Pulmonary/Chest: Effort normal. No respiratory distress. She has wheezes. She has no rales. She exhibits no tenderness.  Neurological: She is alert and oriented to person, place, and time.  Skin: Skin is warm and dry. No rash noted. She is not diaphoretic. No erythema. No pallor.  Psychiatric: She has a normal mood and affect. Her behavior is normal. Judgment and thought content normal.  Vitals reviewed.     Assessment & Plan:  1. Bronchitis - Take prednisone with large quantities of water to help keep blood sugars low.  - HYDROcodone-homatropine (HYCODAN) 5-1.5 MG/5ML syrup; Take 5 mLs by mouth every 8 (eight) hours as needed for cough.  Dispense: 120 mL; Refill: 0 - predniSONE (DELTASONE) 10 MG tablet; 40 mg x 3 days, 20 mg x 3 days, 10 mg x 3 days  Dispense: 21 tablet; Refill: 0 - Follow up if no improvement in 2-3 days or sooner if fever develops   [name], NP

## 2017-09-05 ENCOUNTER — Ambulatory Visit
Admission: RE | Admit: 2017-09-05 | Discharge: 2017-09-05 | Disposition: A | Discharge status: Home / Self Care | Payer: Medicare Other | Source: Ambulatory Visit | Attending: Adult Health | Admitting: Adult Health

## 2017-09-05 DIAGNOSIS — Z1231 Encounter for screening mammogram for malignant neoplasm of breast: Secondary | ICD-10-CM

## 2017-10-31 ENCOUNTER — Ambulatory Visit: Payer: Medicare Other | Admitting: Adult Health

## 2018-01-31 ENCOUNTER — Ambulatory Visit: Payer: Medicare Other | Admitting: Adult Health

## 2018-01-31 ENCOUNTER — Encounter: Payer: Self-pay | Admitting: Adult Health

## 2018-01-31 ENCOUNTER — Encounter (INDEPENDENT_AMBULATORY_CARE_PROVIDER_SITE_OTHER): Payer: Self-pay

## 2018-01-31 VITALS — BP 158/80 | HR 74 | Temp 97.5°F | Wt 174.0 lb

## 2018-01-31 DIAGNOSIS — E139 Other specified diabetes mellitus without complications: Secondary | ICD-10-CM

## 2018-01-31 DIAGNOSIS — I10 Essential (primary) hypertension: Secondary | ICD-10-CM | POA: Diagnosis not present

## 2018-01-31 LAB — POCT GLYCOSYLATED HEMOGLOBIN (HGB A1C): Hemoglobin A1C: 6.5 % — AB (ref 4.0–5.6)

## 2018-01-31 MED ORDER — LISINOPRIL 10 MG PO TABS: 10.0000 mg | ORAL_TABLET | Freq: Every day | ORAL | 1 refills | Status: DC

## 2018-01-31 NOTE — Progress Notes (Signed)
Subjective:    Patient ID: Erin Hatfield, female    DOB: Nov 05, 1950, 67 y.o.   MRN: [id number]  HPI  60 old female who presents to the office today for follow-up regarding diabetes and hypertension.  She is currently maintained on metformin 500 mg twice daily.  He does monitor her sugar at home and reports readings consistently below 120. Her last A1c was 6.6. She continues to walk and drink lots of water.    BP continues to be elevated. She is currently prescribed Atenolol 50 mg daily. Does not routinly check BP at home   BP Readings from Last 3 Encounters:  01/31/18 (!) 158/80  08/27/17 (!) 178/82  08/06/17 (!) 144/80    Review of Systems See HPI   Past Medical History:  Diagnosis Date  . Allergy   . Bunion, left foot   . Diabetes mellitus without complication ([place])   . Hot flashes   . Hypertension     Social History   Socioeconomic History  . Marital status: Married    Spouse name: Not on file  . Number of children: Not on file  . Years of education: Not on file  . Highest education level: Not on file  Occupational History  . Not on file  Social Needs  . Financial resource strain: Not on file  . Food insecurity:    Worry: Not on file    Inability: Not on file  . Transportation needs:    Medical: Not on file    Non-medical: Not on file  Tobacco Use  . Smoking status: Never Smoker  . Smokeless tobacco: Never Used  Substance and Sexual Activity  . Alcohol use: No    Alcohol/week: 0.0 standard drinks  . Drug use: No  . Sexual activity: Not on file  Lifestyle  . Physical activity:    Days per week: Not on file    Minutes per session: Not on file  . Stress: Not on file  Relationships  . Social connections:    Talks on phone: Not on file    Gets together: Not on file    Attends religious service: Not on file    Active member of club or organization: Not on file    Attends meetings of clubs or organizations: Not on file    Relationship status: Not  on file  . Intimate partner violence:    Fear of current or ex partner: Not on file    Emotionally abused: Not on file    Physically abused: Not on file    Forced sexual activity: Not on file  Other Topics Concern  . 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 ABDOMINAL HYSTERECTOMY      Family History  Problem Relation Age of Onset  . Diabetes Sister   . Breast cancer Mother 69  . Lung cancer Father        Smoker  . Diabetes Other   . Hypertension Other   . Diabetes Sister   . Breast cancer Other   . Colon cancer Neg Hx     No Known Allergies  Current Outpatient Medications on File Prior to Visit  Medication Sig Dispense Refill  . amitriptyline (ELAVIL) 50 MG tablet Take  1 tablet (50 mg total) by mouth at bedtime. 90 tablet 3  . aspirin 81 MG tablet Take 81 mg by mouth daily.      Marland Kitchen atenolol (TENORMIN) 50 MG tablet Take 1 tablet (50 mg total) by mouth daily. 90 tablet 3  . cetirizine (ZYRTEC) 10 MG tablet Take 10 mg by mouth as needed.     . cholecalciferol (VITAMIN D) 1000 UNITS tablet Take 1,000 Units by mouth daily.      . Chromium-Cinnamon (CINNAMON PLUS CHROMIUM) 100-500 MCG-MG CAPS Take by mouth 2 (two) times daily.    . fish oil-omega-3 fatty acids 1000 MG capsule Take 1 g by mouth daily.      Marland Kitchen glucose blood (ONETOUCH VERIO) test strip Test once daily Dx E11.9 100 each 12  . metFORMIN (GLUCOPHAGE) 500 MG tablet Take 1 tablet (500 mg total) by mouth 2 (two) times daily with a meal. 180 tablet 3  . Multiple Vitamin (MULTIVITAMIN) tablet Take 1 tablet by mouth daily.    . ONE TOUCH LANCETS MISC 1 each by Does not apply route daily. 200 each 3  . simvastatin (ZOCOR) 10 MG tablet Take 1/2 tablet at bedtime 90 tablet 1   No current  facility-administered medications on file prior to visit.     BP (!) 158/80 (BP Location: Right Arm, Patient Position: Sitting, Cuff Size: Large)   Pulse 74   Temp (!) 97.5 F (36.4 C) (Oral)   Wt 174 lb (78.9 kg)   SpO2 97%   BMI 30.34 kg/m       Objective:   Physical Exam  Constitutional: She is oriented to person, place, and time. She appears well-developed and well-nourished. No distress.  Cardiovascular: Normal rate, regular rhythm, normal heart sounds and intact distal pulses.  Pulmonary/Chest: Effort normal and breath sounds normal.  Neurological: She is alert and oriented to person, place, and time.  Skin: Skin is warm and dry. Capillary refill takes less than 2 seconds. She is not diaphoretic.  Psychiatric: She has a normal mood and affect. Her behavior is normal. Judgment and thought content normal.  Vitals reviewed.     Assessment & Plan:  1. Diabetes 1.5, managed as type 2 ([place]) - POC HgB A1c- 6.5.  - No change  - Will recheck at CPE in December   2. Essential hypertension - Will add lisinopril 10 mg. Send me BP results via mychart in 2 weeks.  - lisinopril (PRINIVIL,ZESTRIL) 10 MG tablet; Take 1 tablet (10 mg total) by mouth daily.  Dispense: 30 tablet; Refill: 1 - Reviewed side effects with patient.   [name], NP

## 2018-01-31 NOTE — Patient Instructions (Signed)
It was great seeing you today   A1c has gone done to 6.5  I have added lisinopril 10 mg to your regimen to bring your blood pressure down   Please send me the results via mychart in 2 weeks

## 2018-03-14 ENCOUNTER — Encounter: Payer: Self-pay | Admitting: Adult Health

## 2018-03-18 ENCOUNTER — Other Ambulatory Visit: Payer: Self-pay | Admitting: Family Medicine

## 2018-03-18 DIAGNOSIS — I10 Essential (primary) hypertension: Secondary | ICD-10-CM

## 2018-03-18 MED ORDER — LISINOPRIL 10 MG PO TABS: 10.0000 mg | ORAL_TABLET | Freq: Every day | ORAL | 0 refills | Status: DC

## 2018-03-18 NOTE — Telephone Encounter (Signed)
Sent to the pharmacy by e-scribe. 

## 2018-03-20 ENCOUNTER — Ambulatory Visit (INDEPENDENT_AMBULATORY_CARE_PROVIDER_SITE_OTHER): Payer: Medicare Other

## 2018-03-20 DIAGNOSIS — Z23 Encounter for immunization: Secondary | ICD-10-CM

## 2018-05-13 ENCOUNTER — Encounter: Payer: Medicare Other | Admitting: Adult Health

## 2018-06-05 ENCOUNTER — Ambulatory Visit (INDEPENDENT_AMBULATORY_CARE_PROVIDER_SITE_OTHER): Payer: Medicare Other | Admitting: Adult Health

## 2018-06-05 ENCOUNTER — Encounter: Payer: Self-pay | Admitting: Adult Health

## 2018-06-05 ENCOUNTER — Telehealth: Payer: Self-pay

## 2018-06-05 ENCOUNTER — Other Ambulatory Visit: Payer: Self-pay | Admitting: *Deleted

## 2018-06-05 VITALS — BP 142/80 | HR 73 | Temp 97.8°F | Ht 64.0 in | Wt 174.6 lb

## 2018-06-05 DIAGNOSIS — R232 Flushing: Secondary | ICD-10-CM

## 2018-06-05 DIAGNOSIS — E559 Vitamin D deficiency, unspecified: Secondary | ICD-10-CM

## 2018-06-05 DIAGNOSIS — Z1159 Encounter for screening for other viral diseases: Secondary | ICD-10-CM

## 2018-06-05 DIAGNOSIS — E1140 Type 2 diabetes mellitus with diabetic neuropathy, unspecified: Secondary | ICD-10-CM

## 2018-06-05 DIAGNOSIS — E139 Other specified diabetes mellitus without complications: Secondary | ICD-10-CM

## 2018-06-05 DIAGNOSIS — I10 Essential (primary) hypertension: Secondary | ICD-10-CM

## 2018-06-05 DIAGNOSIS — E782 Mixed hyperlipidemia: Secondary | ICD-10-CM

## 2018-06-05 DIAGNOSIS — Z0000 Encounter for general adult medical examination without abnormal findings: Secondary | ICD-10-CM | POA: Diagnosis not present

## 2018-06-05 DIAGNOSIS — Z01419 Encounter for gynecological examination (general) (routine) without abnormal findings: Secondary | ICD-10-CM

## 2018-06-05 DIAGNOSIS — Z114 Encounter for screening for human immunodeficiency virus [HIV]: Secondary | ICD-10-CM

## 2018-06-05 LAB — CBC WITH DIFFERENTIAL/PLATELET
Basophils Absolute: 0.1 10*3/uL (ref 0.0–0.1)
Basophils Relative: 1 % (ref 0.0–3.0)
EOS ABS: 0.2 10*3/uL (ref 0.0–0.7)
Eosinophils Relative: 2.9 % (ref 0.0–5.0)
HCT: 39.7 % (ref 36.0–46.0)
Hemoglobin: 12.9 g/dL (ref 12.0–15.0)
LYMPHS ABS: 3.8 10*3/uL (ref 0.7–4.0)
Lymphocytes Relative: 52 % — ABNORMAL HIGH (ref 12.0–46.0)
MCHC: 32.5 g/dL (ref 30.0–36.0)
MCV: 85.1 fl (ref 78.0–100.0)
Monocytes Absolute: 0.6 10*3/uL (ref 0.1–1.0)
Monocytes Relative: 8.3 % (ref 3.0–12.0)
NEUTROS PCT: 35.8 % — AB (ref 43.0–77.0)
Neutro Abs: 2.6 10*3/uL (ref 1.4–7.7)
PLATELETS: 376 10*3/uL (ref 150.0–400.0)
RBC: 4.67 Mil/uL (ref 3.87–5.11)
RDW: 12.5 % (ref 11.5–15.5)
WBC: 7.4 10*3/uL (ref 4.0–10.5)

## 2018-06-05 LAB — COMPREHENSIVE METABOLIC PANEL
ALT: 19 U/L (ref 0–35)
AST: 21 U/L (ref 0–37)
Albumin: 4.6 g/dL (ref 3.5–5.2)
Alkaline Phosphatase: 74 U/L (ref 39–117)
BUN: 11 mg/dL (ref 6–23)
CHLORIDE: 100 meq/L (ref 96–112)
CO2: 28 meq/L (ref 19–32)
Calcium: 10.2 mg/dL (ref 8.4–10.5)
Creatinine, Ser: 0.81 mg/dL (ref 0.40–1.20)
GFR: 84.72 mL/min (ref >60.00)
GLUCOSE: 174 mg/dL — AB (ref 70–99)
Potassium: 4 mEq/L (ref 3.5–5.1)
SODIUM: 138 meq/L (ref 135–145)
Total Bilirubin: 0.7 mg/dL (ref 0.2–1.2)
Total Protein: 7.3 g/dL (ref 6.0–8.3)

## 2018-06-05 LAB — TSH: TSH: 4.08 u[IU]/mL (ref 0.35–4.50)

## 2018-06-05 LAB — LDL CHOLESTEROL, DIRECT: Direct LDL: 102 mg/dL

## 2018-06-05 LAB — LIPID PANEL
Cholesterol: 214 mg/dL — ABNORMAL HIGH (ref 0–200)
HDL: 43.1 mg/dL (ref >39.00)
Total CHOL/HDL Ratio: 5
Triglycerides: 414 mg/dL — ABNORMAL HIGH (ref 0.0–149.0)

## 2018-06-05 LAB — VITAMIN D 25 HYDROXY (VIT D DEFICIENCY, FRACTURES): VITD: 39.1 ng/mL (ref 30.00–100.00)

## 2018-06-05 LAB — HEMOGLOBIN A1C: Hgb A1c MFr Bld: 7.3 % — ABNORMAL HIGH (ref 4.6–6.5)

## 2018-06-05 MED ORDER — SIMVASTATIN 5 MG PO TABS: ORAL_TABLET | ORAL | 3 refills | Status: DC

## 2018-06-05 MED ORDER — AMITRIPTYLINE HCL 50 MG PO TABS: 50.0000 mg | ORAL_TABLET | Freq: Every day | ORAL | 3 refills | Status: DC

## 2018-06-05 NOTE — Telephone Encounter (Signed)
Copied from [place] [id number]. Topic: Referral - Request for Referral >> Erin Hatfield, Erin Hatfield  9:49 AM Scherrie Gerlach wrote: Has patient seen PCP for this complaint? yes Pt saw Tommi Rumps this am and was advised to call [facility] for appt. When pt called them, they advised they need Tommi Rumps to send a referral. Pt would like to see  Dr Edwinna Areola

## 2018-06-05 NOTE — Telephone Encounter (Signed)
Referral placed as requested.

## 2018-06-05 NOTE — Progress Notes (Signed)
Subjective:    Patient ID: Erin Hatfield, female    DOB: 1951/04/02, 68 y.o.   MRN: [id number]  HPI Patient presents for yearly preventative medicine examination. He is a pleasant 68 year old female who  has a past medical history of Allergy, Bunion, left foot, Diabetes mellitus without complication ([place]), Hot flashes, and Hypertension.   DM - she is controlled with Metformin 500 mg BID Lab Results  Component Value Date   HGBA1C 6.5 (A) 01/31/2018    Diabetic Neuropathy - takes Elavil 50 mg QHS - feels controlled.   Hyperlipidemia - takes Zocor 10 mg daily  Lab Results  Component Value Date   CHOL 158 05/02/2017   HDL 40.70 05/02/2017   LDLDIRECT 88.0 05/02/2017   TRIG 298.0 (H) 05/02/2017   CHOLHDL 4 05/02/2017    Essential Hypertension - she takes Atenolol 50 mg daily and lisinopril 10 mg daily. Took medication just prior to arrival.  BP Readings from Last 3 Encounters:  06/05/18 (!) 142/80  01/31/18 (!) 158/80  08/27/17 (!) 178/82    All immunizations and health maintenance protocols were reviewed with the patient and needed orders were placed. utd  Appropriate screening laboratory values were ordered for the patient including screening of hyperlipidemia, renal function and hepatic function.  Medication reconciliation,  past medical history, social history, problem list and allergies were reviewed in detail with the patient  Goals were established with regard to weight loss, exercise, and  diet in compliance with medications Wt Readings from Last 3 Encounters:  06/05/18 174 lb 9.6 oz (79.2 kg)  01/31/18 174 lb (78.9 kg)  08/27/17 176 lb (79.8 kg)   End of life planning was discussed.  She is up to date on routine screening such as mammograms and colonoscopy. She participates in routine dental and vision screens.   She reports having worsening hot flashes. Not using any over the counter supplements. Had this issue many years ago and then it seems to resolve for  a time.   Review of Systems  Constitutional: Positive for diaphoresis.  HENT: Negative.   Eyes: Negative.   Respiratory: Negative.   Cardiovascular: Negative.   Gastrointestinal: Negative.   Endocrine: Negative.   Genitourinary: Negative.   Musculoskeletal: Negative.   Skin: Negative.   Allergic/Immunologic: Negative.   Neurological: Negative.   Hematological: Negative.   Psychiatric/Behavioral: Negative.    Past Medical History:  Diagnosis Date  . Allergy   . Bunion, left foot   . Diabetes mellitus without complication ([place])   . Hot flashes   . Hypertension     Social History   Socioeconomic History  . Marital status: Married    Spouse name: Not on file  . Number of children: Not on file  . Years of education: Not on file  . Highest education level: Not on file  Occupational History  . Not on file  Social Needs  . Financial resource strain: Not on file  . Food insecurity:    Worry: Not on file    Inability: Not on file  . Transportation needs:    Medical: Not on file    Non-medical: Not on file  Tobacco Use  . Smoking status: Never Smoker  . Smokeless tobacco: Never Used  Substance and Sexual Activity  . Alcohol use: No    Alcohol/week: 0.0 standard drinks  . Drug use: No  . Sexual activity: Not on file  Lifestyle  . Physical activity:    Days per week: Not  on file    Minutes per session: Not on file  . Stress: Not on file  Relationships  . Social connections:    Talks on phone: Not on file    Gets together: Not on file    Attends religious service: Not on file    Active member of club or organization: Not on file    Attends meetings of clubs or organizations: Not on file    Relationship status: Not on file  . Intimate partner violence:    Fear of current or ex partner: Not on file    Emotionally abused: Not on file    Physically abused: Not on file    Forced sexual activity: Not on file  Other Topics Concern  . 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 ABDOMINAL HYSTERECTOMY      Family History  Problem Relation Age of Onset  . Diabetes Sister   . Breast cancer Mother 62  . Lung cancer Father        Smoker  . Diabetes Other   . Hypertension Other   . Diabetes Sister   . Breast cancer Other   . Colon cancer Neg Hx     No Known Allergies  Current Outpatient Medications on File Prior to Visit  Medication Sig Dispense Refill  . amitriptyline (ELAVIL) 50 MG tablet Take 1 tablet (50 mg total) by mouth at bedtime. 90 tablet 3  . aspirin 81 MG tablet Take 81 mg by mouth daily.      Marland Kitchen atenolol (TENORMIN) 50 MG tablet Take 1 tablet (50 mg total) by mouth daily. 90 tablet 3  . cetirizine (ZYRTEC) 10 MG tablet Take 10 mg by mouth as needed.     . cholecalciferol (VITAMIN D) 1000 UNITS tablet Take 1,000 Units by mouth daily.      . Chromium-Cinnamon (CINNAMON PLUS CHROMIUM) 100-500 MCG-MG CAPS Take by mouth 2 (two) times daily.    . fish oil-omega-3 fatty acids 1000 MG capsule Take 1 g by mouth daily.      Marland Kitchen glucose blood (ONETOUCH VERIO) test strip Test once daily Dx E11.9 100 each 12  . lisinopril (PRINIVIL,ZESTRIL) 10 MG tablet Take 1 tablet (10 mg total) by mouth daily. 90 tablet 0  . metFORMIN (GLUCOPHAGE) 500 MG tablet Take 1 tablet (500 mg total) by mouth 2 (two) times daily with a meal. 180 tablet 3  . Multiple Vitamin (MULTIVITAMIN) tablet Take 1 tablet by mouth daily.    . ONE TOUCH LANCETS MISC 1 each by Does not apply route daily. 200 each 3  . simvastatin (ZOCOR) 10 MG tablet Take 1/2 tablet at bedtime 90 tablet 1   No current facility-administered medications on file prior to visit.     BP (!) 142/80 (BP Location: Left Arm, Patient Position: Sitting, Cuff Size: Normal)   Pulse 73   Temp  97.8 F (36.6 C) (Oral)   Ht 5\' 4"  (1.626 m)   Wt 174 lb 9.6 oz (79.2 kg)   SpO2 95%   BMI 29.97 kg/m       Objective:   Physical Exam Vitals signs and nursing note reviewed.  Constitutional:      General: She is not in acute distress.  Appearance: Normal appearance. She is well-developed and normal weight.  HENT:     Head: Normocephalic and atraumatic.     Right Ear: Tympanic membrane, ear canal and external ear normal. There is no impacted cerumen.     Left Ear: Tympanic membrane, ear canal and external ear normal. There is no impacted cerumen.     Nose: Nose normal. No congestion or rhinorrhea.     Mouth/Throat:     Mouth: Mucous membranes are moist.     Pharynx: Oropharynx is clear. No oropharyngeal exudate.  Eyes:     General:        Right eye: No discharge.        Left eye: No discharge.     Extraocular Movements: Extraocular movements intact.     Conjunctiva/sclera: Conjunctivae normal.     Pupils: Pupils are equal, round, and reactive to light.  Neck:     Musculoskeletal: Normal range of motion and neck supple. No neck rigidity or muscular tenderness.     Thyroid: No thyromegaly.     Vascular: No carotid bruit.     Trachea: No tracheal deviation.  Cardiovascular:     Rate and Rhythm: Normal rate and regular rhythm.     Heart sounds: Normal heart sounds. No murmur. No friction rub. No gallop.   Pulmonary:     Effort: Pulmonary effort is normal. No respiratory distress.     Breath sounds: Normal breath sounds. No wheezing or rales.  Chest:     Chest wall: No tenderness.  Abdominal:     General: Bowel sounds are normal. There is no distension.     Palpations: Abdomen is soft. There is no mass.     Tenderness: There is no abdominal tenderness. There is no right CVA tenderness, left CVA tenderness, guarding or rebound.     Hernia: No hernia is present.  Musculoskeletal: Normal range of motion.        General: No swelling, tenderness, deformity or signs of  injury.     Right lower leg: No edema.     Left lower leg: No edema.  Lymphadenopathy:     Cervical: No cervical adenopathy.  Skin:    General: Skin is warm and dry.     Capillary Refill: Capillary refill takes less than 2 seconds.     Coloration: Skin is not jaundiced or pale.     Findings: No bruising, erythema, lesion or rash.  Neurological:     General: No focal deficit present.     Mental Status: She is alert and oriented to person, place, and time.     Cranial Nerves: No cranial nerve deficit.     Coordination: Coordination normal.  Psychiatric:        Mood and Affect: Mood normal.        Behavior: Behavior normal.        Thought Content: Thought content normal.        Judgment: Judgment normal.       Assessment & Plan:  1. Routine general medical examination at a health care facility - encouraged diet and exercise  - Follow up in one year or sooner if needed - CBC with Differential/Platelet - Comprehensive metabolic panel - Hemoglobin A1c - Lipid panel - TSH  2. Diabetes 1.5, managed as type 2 ([place]) - Consider increase in Metformin  - Likely 6 month follow up  - CBC with Differential/Platelet - Comprehensive metabolic panel - Hemoglobin A1c - Lipid panel - TSH - amitriptyline (ELAVIL)  50 MG tablet; Take 1 tablet (50 mg total) by mouth at bedtime.  Dispense: 90 tablet; Refill: 3  3. Diabetic neuropathy, painful ([facility]) - Controlled with Elavil  - CBC with Differential/Platelet - Comprehensive metabolic panel - Hemoglobin A1c - Lipid panel - TSH - amitriptyline (ELAVIL) 50 MG tablet; Take 1 tablet (50 mg total) by mouth at bedtime.  Dispense: 90 tablet; Refill: 3  4. Essential hypertension - Controlled. No change in medication  - CBC with Differential/Platelet - Comprehensive metabolic panel - Hemoglobin A1c - Lipid panel - TSH  5. Mixed hyperlipidemia - Consider change in statin  - CBC with Differential/Platelet - Comprehensive metabolic panel -  Hemoglobin A1c - Lipid panel - TSH  6. Encounter for screening for HIV  - HIV Antibody (routine testing w rflx)  7. Need for hepatitis C screening test  - Hep C Antibody  8. Vitamin D deficiency  - Vitamin D, 25-hydroxy  9. Hot flashes - Will have her try [facility]  - She will also schedule an appointment with GYN as there are some issues about family history of ovarian cancer that she would like to discuss with them    [name], NP

## 2018-06-06 ENCOUNTER — Other Ambulatory Visit: Payer: Self-pay | Admitting: Adult Health

## 2018-06-06 ENCOUNTER — Telehealth: Payer: Self-pay | Admitting: Obstetrics & Gynecology

## 2018-06-06 ENCOUNTER — Telehealth: Payer: Self-pay | Admitting: Adult Health

## 2018-06-06 DIAGNOSIS — I10 Essential (primary) hypertension: Secondary | ICD-10-CM

## 2018-06-06 LAB — HEPATITIS C ANTIBODY
Hepatitis C Ab: NONREACTIVE
SIGNAL TO CUT-OFF: 0.01 (ref <1.00)

## 2018-06-06 LAB — HIV ANTIBODY (ROUTINE TESTING W REFLEX): HIV 1&2 Ab, 4th Generation: NONREACTIVE

## 2018-06-06 MED ORDER — SIMVASTATIN 5 MG PO TABS: 5.0000 mg | ORAL_TABLET | Freq: Every day | ORAL | 3 refills | Status: DC

## 2018-06-06 NOTE — Telephone Encounter (Signed)
Copied from [place]. Topic: General - Other >> Jun 06, 2018 10:38 AM Keene Breath wrote: Reason for CRM: Patient called to ask the doctor or nurse  to call her pharmacy, [facility] [place] [phone] (Phone) [phone] (Fax), to clarify how she should be taking her medication with the prescription that they have for simvastatin (ZOCOR) 5 MG tablet.  Patient feels that there is a mix up on the script which she said was discussed with Dr. Tommi Rumps at her last visit.  Please advise and call patient and [facility] to clarify.  Patient's CB# [phone]

## 2018-06-06 NOTE — Telephone Encounter (Signed)
Resent, thanks for letting me know

## 2018-06-06 NOTE — Telephone Encounter (Signed)
Called and left a message for patient to call back to schedule a new patient doctor referral appointment with our office to see Dr. Sabra Heck for an annual wellness exam.

## 2018-06-06 NOTE — Telephone Encounter (Signed)
Noted  

## 2018-06-06 NOTE — Telephone Encounter (Signed)
I called the pt and she stated she was taking Simvastatin 10mg  daily and at the appt yesterday she was advised to take 1/2 pill.  She stated Tommi Rumps mentioned to her there was a prescription for Simvastatin 5mg  in which she could take 1 pill instead.  The Rx that was sent for Simvastatin 5mg  states to take 1/2 and not 1-please send new Rx.  Message sent to [facility].

## 2018-06-07 ENCOUNTER — Encounter: Payer: Self-pay | Admitting: Adult Health

## 2018-06-16 ENCOUNTER — Telehealth: Payer: Self-pay | Admitting: *Deleted

## 2018-06-16 NOTE — Telephone Encounter (Signed)
Copied from [place] [id number]. Topic: General - Other >> Jun 16, 2018  3:26 PM Judyann Munson wrote: Reason for CRM:  patient is calling to state she was advise in labs result,  that a medication would be called in  Tricor that will help with the simvastatin to decreased triglyceride levels.  She is wanting to speak with a nurse about adding this medication. Please advise   Tommi Rumps, I do not see anywhere in the chart of any mention of Tricor being added - only the change to her Zocor Rx. Please advise, thank .

## 2018-06-17 MED ORDER — FENOFIBRATE 48 MG PO TABS: 48.0000 mg | ORAL_TABLET | Freq: Every day | ORAL | 3 refills | Status: DC

## 2018-06-17 NOTE — Telephone Encounter (Signed)
Ok to start with 48 mg

## 2018-06-17 NOTE — Telephone Encounter (Signed)
Erin Hatfield, spoke to the pt.  Tricor has not been sent in to the pharmacy.  What strength?

## 2018-06-17 NOTE — Telephone Encounter (Signed)
Sent to the pharmacy by e-scribe. 

## 2018-06-20 ENCOUNTER — Other Ambulatory Visit: Payer: Self-pay | Admitting: Adult Health

## 2018-06-20 DIAGNOSIS — I10 Essential (primary) hypertension: Secondary | ICD-10-CM

## 2018-06-24 ENCOUNTER — Encounter: Payer: Self-pay | Admitting: Obstetrics & Gynecology

## 2018-06-24 ENCOUNTER — Other Ambulatory Visit: Payer: Self-pay

## 2018-06-24 ENCOUNTER — Ambulatory Visit: Payer: Medicare Other | Admitting: Obstetrics & Gynecology

## 2018-06-24 VITALS — BP 152/86 | HR 102 | Resp 14 | Ht 62.75 in | Wt 174.2 lb

## 2018-06-24 DIAGNOSIS — Z803 Family history of malignant neoplasm of breast: Secondary | ICD-10-CM

## 2018-06-24 DIAGNOSIS — R232 Flushing: Secondary | ICD-10-CM | POA: Diagnosis not present

## 2018-06-24 DIAGNOSIS — Z8489 Family history of other specified conditions: Secondary | ICD-10-CM

## 2018-06-24 DIAGNOSIS — Z8041 Family history of malignant neoplasm of ovary: Secondary | ICD-10-CM

## 2018-06-24 NOTE — Telephone Encounter (Signed)
Sent to the pharmacy by e-scribe. 

## 2018-06-24 NOTE — Progress Notes (Signed)
68 y.o. [id number] Married [place] American female here as new patient for new patient consultation.  Reports her sister was diagnosed with ovarian cancer in June.  She was having issued with bloating and digestive issues.  She had lost significant weight prior to her diagnosis.  The gyn/oncologist was very concerned about the significant weight loss and surgery.  She did have chemotherapy and is now on an oral chemotherapy.  She ultimately did not have any surgery for her disease.  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  Pt needs to have testing.  A separate issue/concern is increasing hot flashes over the past few months.  She does have diabetes and HbA1C was 7.3 at last visit.  This is highest it has ever been.  Aware this could be contributing . She has started black cohosh and it has helped some.  wants other recommendations.  Estroven, gabapentin, SSRIs/SNRIs discussed.  She does not want another prescription at this time but appreciative of the information.  Will try Estroven.  Aware I would not recommend estrogen in her due to age and increased risks for CVD due to diabetes and hypertension.  She is not interested in estrogen either.    Patient's last menstrual period was 05/14/2000 (approximate).          Sexually active: No.  The current method of family planning is post menopausal status.    Exercising: Yes.    walking Smoker:  no  Health Maintenance: Pap:  Unsure  History of abnormal Pap:  no MMG:  09/05/17 BIRADS1:neg  Colonoscopy:  03/01/15 polypoid tissue.  Follow up 10 years.  Dr. Gemma Payor.   BMD:   03/26/16 Normal  TDaP:  2007? Pneumonia vaccine(s):  2016  Shingrix:   No Hep C testing: 06/05/18 Neg   Screening Labs: PCP   reports that she has never smoked. She has never used smokeless tobacco. She reports that she does not drink alcohol or use drugs.  Past Medical History:  Diagnosis Date  . Allergy   . Bunion, left foot   . Diabetes mellitus without complication ([place])   . Hot flashes   . Hypertension     Past Surgical History:  Procedure Laterality Date  . VAGINAL HYSTERECTOMY  2002   fibroids    Current Outpatient Medications  Medication Sig Dispense Refill  . amitriptyline (ELAVIL) 50 MG tablet Take 1 tablet (50 mg total) by mouth at bedtime. 90 tablet 3  . aspirin 81 MG tablet Take 81 mg by mouth daily.      Marland Kitchen atenolol (TENORMIN) 50 MG tablet TAKE 1 TABLET BY MOUTH ONCE DAILY 90 tablet 1  . cetirizine (ZYRTEC) 10 MG tablet Take 10 mg by mouth as needed.     . cholecalciferol (VITAMIN D) 1000 UNITS tablet Take 1,000 Units by mouth daily.      . Chromium-Cinnamon (CINNAMON PLUS CHROMIUM) 100-500 MCG-MG CAPS Take by mouth 2 (two) times daily.    . fenofibrate (TRICOR) 48 MG tablet Take 1 tablet (48 mg total) by mouth daily. 90 tablet 3  . fish oil-omega-3 fatty acids 1000 MG capsule Take 1 g by mouth daily.      Marland Kitchen glucose blood (ONETOUCH VERIO) test strip Test  once daily Dx E11.9 100 each 12  . lisinopril (PRINIVIL,ZESTRIL) 10 MG tablet Take 1 tablet (10 mg total) by mouth daily. 90 tablet 0  . metFORMIN (GLUCOPHAGE) 500 MG tablet Take 1 tablet (500 mg total) by mouth 2 (two) times daily with a meal. 180 tablet 3  . Multiple Vitamin (MULTIVITAMIN) tablet Take 1 tablet by mouth daily.    . ONE TOUCH LANCETS MISC 1 each by Does not apply route daily. 200 each 3  . simvastatin (ZOCOR) 5 MG tablet Take 1 tablet (5 mg total) by mouth daily. 90 tablet 3   No current facility-administered medications for this visit.     Family History  Problem Relation Age of Onset  . Diabetes Sister   . Ovarian cancer Sister   . Breast cancer Mother 28  . Lung cancer Father        Smoker   . Diabetes Other   . Hypertension Other   . Diabetes Sister   . Breast cancer Niece   . Colon cancer Neg Hx     Review of Systems  All other systems reviewed and are negative.   Exam:   BP (!) 152/86 (BP Location: Left Arm, Patient Position: Sitting, Cuff Size: Large)   Pulse (!) 102   Resp 14   Ht 5' 2.75" (1.594 m)   Wt 174 lb 3.2 oz (79 kg)   LMP 05/14/2000 (Approximate)   BMI 31.10 kg/m     Height: 5' 2.75" (159.4 cm)  Ht Readings from Last 3 Encounters:  06/24/18 5' 2.75" (1.594 m)  06/05/18 5' 4"  (1.626 m)  05/02/17 5' 3.5" (1.613 m)    General appearance: alert, cooperative and appears stated age No exam performed today.  A:  Family hx of ovarian cancer in sister diagnosed with BR1P1 gene mutation Family hx of breast cancer in niece (daughter of sister with ovarian cancer) Hypertension Diabetes Hot flashes H/o TVH due to fibroids  P:   Pt will be sent to medical genetics for site specific testing of gene mutation and, hopefully, gene variant as well.  If BR1P1 mutation is present, she will return for laparoscopic BSO.  Would do ca-125 and PUS prior to surgery as well.  We discussed all of this today as well as possible "screening" if the testing is negative. Options for treatment of hot flashes discussed.  Pt interested in OTC options at this time.  ~30 minutes spent with patient >50% of time was in face to face discussion of above.

## 2018-06-24 NOTE — Progress Notes (Signed)
Left message with genetics scheduler to return call to patient home phone number to schedule consult for Sister Positive BRIP 1 Gene.  Referral placed.

## 2018-06-25 ENCOUNTER — Encounter: Payer: Self-pay | Admitting: Genetic Counselor

## 2018-06-25 ENCOUNTER — Telehealth: Payer: Self-pay | Admitting: Genetic Counselor

## 2018-06-25 NOTE — Telephone Encounter (Signed)
Received a new genetic counseling referral from Dr. Hale Bogus for fhx of ovarian cancer. Pt has been cld and scheduled to see Roma Kayser on 2/17 at 9am. Pt aware to arrive early to be checked in on time. Letter mailed.

## 2018-06-30 ENCOUNTER — Inpatient Hospital Stay: Payer: Medicare Other | Attending: Genetic Counselor | Admitting: Genetic Counselor

## 2018-06-30 ENCOUNTER — Inpatient Hospital Stay: Payer: Medicare Other

## 2018-06-30 ENCOUNTER — Encounter: Payer: Self-pay | Admitting: Genetic Counselor

## 2018-06-30 DIAGNOSIS — Z803 Family history of malignant neoplasm of breast: Secondary | ICD-10-CM

## 2018-06-30 DIAGNOSIS — Z8041 Family history of malignant neoplasm of ovary: Secondary | ICD-10-CM

## 2018-06-30 NOTE — Progress Notes (Signed)
REFERRING PROVIDER: Megan Salon, MD [place] [address], [place] [id number]  PRIMARY PROVIDER:  [name], NP  PRIMARY REASON FOR VISIT:  1. Family history of breast cancer   2. Family history of ovarian cancer      HISTORY OF PRESENT ILLNESS:   Ms. Hamler, a 68 y.o. female, was seen for a [facility] cancer genetics consultation at the request of Dr. Sabra Heck due to a family history of cancer.  Ms. Roam presents to clinic today to discuss the possibility of a hereditary predisposition to cancer, genetic testing, and to further clarify her future cancer risks, as well as potential cancer risks for family members.   Ms. Weiland is a 68 y.o. female with no personal history of cancer.  She has a sister who was diagnosed with ovarian cancer and was found to have a BRIP1 mutation.  CANCER HISTORY:   No history exists.     HORMONAL RISK FACTORS:  Menarche was at age 30.  First live birth at age 78.  OCP use for approximately 5+ years.  Ovaries intact: yes.  Hysterectomy: yes.  Menopausal status: postmenopausal.  HRT use: 1 years. Colonoscopy: yes; normal. Mammogram within the last year: yes. Number of breast biopsies: 0. Up to date with pelvic exams:  n/a. Any excessive radiation exposure in the past:  no  Past Medical History:  Diagnosis Date  . Allergy   . Bunion, left foot   . Diabetes mellitus without complication ([place])   . Family history of breast cancer   . Family history of ovarian cancer   . Hot flashes   . Hypertension     Past Surgical History:  Procedure Laterality Date  . VAGINAL HYSTERECTOMY  2002   fibroids    Social History   Socioeconomic History  . Marital status: Married    Spouse name: Not on file  . Number of children: Not on file  . Years of education: Not on file  . Highest education level: Not on file  Occupational History  . Not on file  Social Needs  . Financial resource strain: Not on file  . Food insecurity:     Worry: Not on file    Inability: Not on file  . Transportation needs:    Medical: Not on file    Non-medical: Not on file  Tobacco Use  . Smoking status: Never Smoker  . Smokeless tobacco: Never Used  Substance and Sexual Activity  . Alcohol use: No  . Drug use: No  . Sexual activity: Not Currently  Lifestyle  . Physical activity:    Days per week: Not on file    Minutes per session: Not on file  . Stress: Not on file  Relationships  . Social connections:    Talks on phone: Not on file    Gets together: Not on file    Attends religious service: Not on file    Active member of club or organization: Not on file    Attends meetings of clubs or organizations: Not on file    Relationship status: Not on file  Other Topics Concern  . 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  Significant diagnoses are listed below:  Family History  Problem Relation Age of Onset  . Diabetes Sister   . Ovarian cancer Sister 1  . Breast cancer Mother 15  . Lung cancer Father        Smoker  . Diabetes Other   . Hypertension Other   . Diabetes Sister   . Breast cancer Niece 51  . Colon cancer Neg Hx     The patient has one daughter who is cancer free.  She has two sisters and two brothers.  One sister was diagnosed with ovarian cancer and has a BRIP1 mutation.  This sister also has a daughter with breast cancer at 64.  The patient's parents are both deceased.   The patient's mother was diagnosed with breast cancer at 72 and died at 11.  She had two brothers who are cancer free.  The maternal grandparents are also deceased from non cancer related issues.  The patient's father was diagnosed with lung cancer. He was a smoker.  He had a sister and two  brothers.  There is no other reported family history of cancer.  Ms. Rachels is aware of previous family history of genetic testing for hereditary cancer risks. Patient's maternal ancestors are of African American descent, and paternal ancestors are of African American descent. There is no reported Ashkenazi Jewish ancestry. There is no known consanguinity.  GENETIC COUNSELING ASSESSMENT: LIVIAH CAKE is a 68 y.o. female with a family history of breast and ovarian cancer which is somewhat suggestive of a hereditary cancer syndrome and predisposition to cancer. We, therefore, discussed and recommended the following at today's visit.   DISCUSSION: We discussed that about 15-20% of ovarian cancer is hereditary, with most cases due to BRCA mutations.  The patient's sister had genetic testing through the [facility] panel, and was determined to have a BRIP1 mutation through [facility].  BRIP1 mutations can increase the risk for ovarian cancer, and has a small association with an increased risk for breast cancer, although the risks do not seem to be high enough to warrant medical management changed for breast cancer.  The patient has a 50% chance of having this same mutation.  We discussed that based on the family history of breast and ovarian cancer, she meets medical criteria for genetic testing for other breast and ovarian cancer genes.  While the chances of finding another cancer gene is low, studies have found that about 6% of individuals with one hereditary cancer gene running in the family also have a second.    We reviewed the characteristics, features and inheritance patterns of hereditary cancer syndromes. We also discussed genetic testing, including the appropriate family members to test, the process of testing, insurance coverage and turn-around-time for results. We discussed the implications of a negative, positive and/or variant of uncertain significant result. We recommended Ms. Pultz pursue  genetic testing for the multi-cancer gene panel. The Multi-Gene Panel offered by [facility] includes sequencing and/or deletion duplication testing of the following 84 genes: AIP, ALK, APC, ATM, AXIN2,BAP1,  BARD1, BLM, BMPR1A, BRCA1, BRCA2, BRIP1, CASR, CDC73, CDH1, CDK4, CDKN1B, CDKN1C, CDKN2A (p14ARF), CDKN2A (p16INK4a), CEBPA, CHEK2, CTNNA1, DICER1, DIS3L2, EGFR (c.2369C>T, p.Thr790Met variant only), EPCAM (Deletion/duplication testing only), FH, FLCN, GATA2, GPC3, GREM1 (Promoter region deletion/duplication testing only), HOXB13 (c.251G>A, p.Gly84Glu), HRAS, KIT, MAX, MEN1, MET, MITF (c.952G>A, p.Glu318Lys variant only), MLH1, MSH2, MSH3, MSH6, MUTYH, NBN, NF1, NF2, NTHL1, PALB2, PDGFRA, PHOX2B, PMS2, POLD1, POLE, POT1, PRKAR1A, PTCH1, PTEN, RAD50, RAD51C, RAD51D, RB1, RECQL4, RET, RUNX1, SDHAF2, SDHA (sequence changes  only), SDHB, SDHC, SDHD, SMAD4, SMARCA4, SMARCB1, SMARCE1, STK11, SUFU, TERC, TERT, TMEM127, TP53, TSC1, TSC2, VHL, WRN and WT1.    Based on Ms. Cervini's family history of cancer, she meets medical criteria for genetic testing. Despite that she meets criteria, she may still have an out of pocket cost. We discussed that if her out of pocket cost for testing is over $100, the laboratory will call and confirm whether she wants to proceed with testing.  If the out of pocket cost of testing is less than $100 she will be billed by the genetic testing laboratory.   PLAN: After considering the risks, benefits, and limitations, Ms. Dorian  provided informed consent to pursue genetic testing and the blood sample was sent to [facility] for analysis of the multi-cancer panel. Results should be available within approximately 2-3 weeks' time, at which point they will be disclosed by telephone to Ms. Groseclose, as will any additional recommendations warranted by these results. Ms. Sweeten will receive a summary of her genetic counseling visit and a copy of her results once available. This information  will also be available in Epic. We encouraged Ms. Vanderpol to remain in contact with cancer genetics annually so that we can continuously update the family history and inform her of any changes in cancer genetics and testing that may be of benefit for her family. Ms. Thome questions were answered to her satisfaction today. Our contact information was provided should additional questions or concerns arise.  Lastly, we encouraged Ms. Giel to remain in contact with cancer genetics annually so that we can continuously update the family history and inform her of any changes in cancer genetics and testing that may be of benefit for this family.   Ms.  Pautler questions were answered to her satisfaction today. Our contact information was provided should additional questions or concerns arise. Thank you for the referral and allowing [place] to share in the care of your patient.    P. Florene Glen, [place], [facility] Certified Genetic Counselor [place] [email or web address] phone: [phone]  The patient was seen for a total of 45 minutes in face-to-face genetic counseling.  This patient was discussed with Drs. Magrinat, Lindi Adie and/or Burr Medico who agrees with the above.    _______________________________________________________________________ For Office Staff:  Number of people involved in session: 1 Was an Intern/ student involved with case: no

## 2018-07-11 ENCOUNTER — Other Ambulatory Visit: Payer: Self-pay | Admitting: Adult Health

## 2018-07-11 DIAGNOSIS — E139 Other specified diabetes mellitus without complications: Secondary | ICD-10-CM

## 2018-07-15 NOTE — Telephone Encounter (Signed)
Sent to the pharmacy by e-scribe. 

## 2018-07-18 ENCOUNTER — Telehealth: Payer: Self-pay | Admitting: Genetic Counselor

## 2018-07-18 ENCOUNTER — Encounter: Payer: Self-pay | Admitting: Genetic Counselor

## 2018-07-18 DIAGNOSIS — Z1502 Genetic susceptibility to malignant neoplasm of ovary: Secondary | ICD-10-CM | POA: Insufficient documentation

## 2018-07-18 NOTE — Telephone Encounter (Signed)
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  She will come in for discussion on Tuesday, March 10. 

## 2018-07-22 ENCOUNTER — Inpatient Hospital Stay: Payer: Medicare Other | Attending: Genetic Counselor | Admitting: Genetic Counselor

## 2018-07-22 DIAGNOSIS — Z8041 Family history of malignant neoplasm of ovary: Secondary | ICD-10-CM | POA: Diagnosis not present

## 2018-07-22 DIAGNOSIS — Z801 Family history of malignant neoplasm of trachea, bronchus and lung: Secondary | ICD-10-CM

## 2018-07-22 DIAGNOSIS — Z1379 Encounter for other screening for genetic and chromosomal anomalies: Secondary | ICD-10-CM

## 2018-07-22 DIAGNOSIS — Z803 Family history of malignant neoplasm of breast: Secondary | ICD-10-CM | POA: Diagnosis not present

## 2018-07-22 NOTE — Progress Notes (Addendum)
GENETIC TEST RESULTS   Patient Name: Erin Hatfield Patient Age: 68 y.o. Encounter Date: 07/22/2018  Referring Provider: Edwinna Areola, MD    Erin Hatfield was seen in the [place] clinic on June 30, 2018 due to a family history of cancer, and a known familial mutation in [place], as well as a concern regarding a hereditary predisposition to cancer in the family. Please refer to the prior Genetics clinic note for more information regarding Erin Hatfield's medical and family histories and our assessment at the time.   FAMILY HISTORY:  We obtained a detailed, 4-generation family history.  Significant diagnoses are listed below: Family History  Problem Relation Age of Onset  . Diabetes Sister   . Ovarian cancer Sister 35  . Breast cancer Mother 81  . Lung cancer Father        Smoker  . Diabetes Other   . Hypertension Other   . Diabetes Sister   . Breast cancer Niece 3  . Colon cancer Neg Hx     The patient has one daughter who is cancer free.  She has two sisters and two brothers.  One sister was diagnosed with ovarian cancer and has a BRIP1 mutation.  This sister also has a daughter with breast cancer at 78.  The patient's parents are both deceased.   The patient's mother was diagnosed with breast cancer at 22 and died at 12.  She had two brothers who are cancer free.  The maternal grandparents are also deceased from non cancer related issues.  The patient's father was diagnosed with lung cancer. He was a smoker.  He had a sister and two brothers.  There is no other reported family history of cancer.  Ms. Bermingham is aware of previous family history of genetic testing for hereditary cancer risks. Patient's maternal ancestors are of African American descent, and paternal ancestors are of African American descent. There is no reported Ashkenazi Jewish ancestry. There is no known consanguinity.   .  GENETIC TESTING:  At the time of Erin Hatfield's visit, we recommended  she pursue genetic testing of the multi-cancer gene panel. 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 (H.[id number]*6) There were no deleterious mutations in: AIP, ALK, APC, ATM, AXIN2,BAP1,  BARD1, BLM, BMPR1A, BRCA1, BRCA2, CASR, CDC73, CDH1, CDK4, CDKN1B, CDKN1C, CDKN2A (p14ARF), CDKN2A (p16INK4a), CEBPA, CHEK2, CTNNA1, DICER1, DIS3L2, EGFR (c.2369C>T, p.Thr790Met variant only), EPCAM (Deletion/duplication testing only), FH, FLCN, GATA2, GPC3, GREM1 (Promoter region deletion/duplication testing only), HOXB13 (c.251G>A, p.Gly84Glu), HRAS, KIT, MAX, MEN1, MET, MITF (c.952G>A, p.Glu318Lys variant only), MLH1, MSH2, MSH3, MSH6, MUTYH, NBN, NF1, NF2, NTHL1, PALB2, PDGFRA, PHOX2B, PMS2, POLD1, POLE, POT1, PRKAR1A, PTCH1, PTEN, RAD50, RAD51C, RAD51D, RB1, RECQL4, RET, RUNX1, SDHAF2, SDHA (sequence changes only), SDHB, SDHC, SDHD, SMAD4, SMARCA4, SMARCB1, SMARCE1, STK11, SUFU, TERC, TERT, TMEM127, TP53, TSC1, TSC2, VHL, WRN and WT1.   The discussion of Erin Hatfield's genetic testing will be broken down by gene, as outlined below:  Clinical condition - BRIP1 Women who are carriers of a single pathogenic BRIP1 variant have an increased risk of breast and ovarian cancer. The exact risk figures for breast cancer have yet to be determined; however, studies suggest the risk of ovarian cancer is approximately 8% and may be up to 15% (PMID: 62947654, 65035465, 68127517, 00174944). An individual with a BRIP1 pathogenic variant will not necessarily develop cancer in their  lifetime, but the risk for cancer is increased over the general population risk.   BRIP1 also has preliminary evidence of an association with hematologic malignancies. The meaning of preliminary-evidence genes is that an association between the gene and the specific condition is suggested,  but has not been completely established. This uncertainty may be resolved as new information becomes available, and therefore clinicians may continue to order these preliminary-evidence genes.  Inheritance Hereditary predisposition to cancer due to a single pathogenic variant in the BRIP1 gene has autosomal dominant inheritance. This means that an individual with a pathogenic variant has a 50% chance of passing the condition on to their offspring. Once such a variant is detected in an individual, it is possible to identify at-risk relatives who can pursue testing for this specific familial variant. Many cases are inherited from a parent, but some cases can occur spontaneously (i.e., an individual with a pathogenic variant has parents who do not have it).   Additionally, individuals with a pathogenic variant in BRIP1 are carriers of Fanconi anemia type J. Fanconi anemia is an autosomal recessive disorder that is characterized by bone marrow failure and variable presentation of anomalies, including short stature, abnormal skin pigmentation, abnormal thumbs, malformations of the skeletal and central nervous systems, and developmental delay (PMID: 7867544, 92010071). Risks for leukemia and early onset solid tumors are significantly elevated (PMID: 21975883, 25498264, 15830940). For there to be a risk of Fanconi anemia in offspring, both parents would each have to have a single pathogenic variant in [place]; in such a case, the risk of having an affected child is 25%.  Management The [place] (NCCN) recommends consideration of prophylactic salpingo-oophorectomy (surgical removal of the ovaries and fallopian tubes) for women with a pathogenic variant in BRIP1 after childbearing is complete. The current evidence is insufficient to make a firm recommendation as to the optimal age for this procedure. However, based on the current, limited evidence base, a discussion about surgery should be  held around 11-71 years of age or earlier based on a specific family history of early-onset ovarian cancer ( Artist. Genetic/Familial High-Risk Assessment: Breast and Ovarian. Version 2.2019). Women electing to defer prophylactic oophorectomy can consider screening with" or "using serum CA-125 and transvaginal ultrasound; however, data do not support such screening and it should not be a substitute for preventive surgery. Ms. Six sees Dr. Edwinna Areola for her GYN care.  Ms. Tawney will discuss this information with further with her provider.     The current NCCN guidelines do not recommend additional breast cancer screening for individuals with a single pathogenic BRIP1 variant beyond what is recommended for the general population. However, they caution that cancer screening should ultimately be guided by personal and family history ( Artist. Genetic/Familial High-Risk Assessment: Breast and Ovarian. Version 1.2020).  An individual's cancer risk and medical management are not determined by genetic test results alone. Overall cancer risk assessment incorporates additional factors, including personal medical history, family history, and any available genetic information that may result in a personalized plan for cancer prevention and surveillance.  Clinical condition - NF1 The NF1 gene is associated with autosomal dominant neurofibromatosis type 1 (NF1; MedGen UID: 76808), neurofibromatosis-Noonan syndrome (NFNS; MedGen UID: N2203334), and Gerrit Halls (MedGen UID: 811031). Additionally, evidence of varying degrees suggests a possible association between the NF1 gene and several cancer types (PMID: 59458592, 92446286, 38177116, 57903833).  NF1 is a neurocutaneous condition with extremely variable clinical features that increase in  frequency with age and include multiple cafe-au-lait spots, axillary and inguinal freckling, iris Lisch  nodules, choroidal freckling, optic gliomas, and cutaneous neurofibromas (PMID: 62952841, 32440102, 72536644). Plexiform neurofibromas develop in approximately half of individuals with NF1; however, many occur internally and therefore escape clinical detection unless they become symptomatic (PMID: 03474259, 56387564, 33295188). Nevus anemicus and juvenile xanthogranuloma are more common in individuals with NF1 than in the general population (PMID: 41660630, 16010932).  Approximately 50% of individuals diagnosed with NF1 have been noted to exhibit learning disabilities (PMID: 35573220). More significant intellectual disability has been observed in 6-7% of individuals with NF1, while autistic spectrum disorder has been noted in approximately 30% of children with NF1 (PMID: 25427062, 37628315, 17616073). Individuals with NF1 often have a larger head circumference than members of the general population and tend to be below average in height (PMID: 71062694, 85462703).  Some individuals with NF1 develop polyneuropathy from multiple nerve root tumors, which can also result in a higher risk for malignant peripheral nerve sheath tumors (PMID: 50093818, 29937169). Individuals with NF1 are at a higher risk for seizures and osteopenia/osteoporosis than those in the general population (PMID: 67893810, 17510258, 5277824, 23536144, 31540086). Long bone dysplasia, typically congenital anterolateral bowing of the lower leg, pseudarthrosis, scoliosis, sphenoid wing dysplasia, and vertebral dysplasia have all been associated with NF1 (PMID: 76195093, 26712458, 09983382, 50539767, 34193790).  Vasculopathy and cardiac issues associated with NF1 can include pulmonary valve stenosis, hypertrophic cardiomyopathy, pulmonary hypertension, renal artery stenosis, coarctation of the aorta, stroke, stenotic or ectatic cerebral arteries, moyamoya disease, and intracranial aneurysms (PMID: 24097353, 29924268, 34196222, 97989211, 94174081,  44818563, 14970263).  There are risks of various malignancies, including peripheral nerve sheath tumors, optic gliomas, brain tumors, pheochromocytomas, and gastrointestinal stromal tumors (GIST; PMID: 78588502). Recent evidence suggests that there is also an increased risk of adult-onset breast cancer in women (PMID: 77412878).  Inheritance NF1, NFNS, and Watson syndrome are all inherited as autosomal dominant conditions. This means that an individual with a pathogenic variant in NF1 has a 50% chance of passing the variant to their offspring. However, due to the extreme variability of these conditions, it is not possible to predict the specific phenotype that will be exhibited by the offspring (PMID: 67672094, 70962836). Approximately half of NF1 cases are inherited, and the remainder is the result of de novo pathogenic variants (PMID: 62947654). Germline mosaicism for NF1 has been demonstrated (PMID: 65035465, 68127517).  Management Due to the multisystemic nature of these conditions, recommended management for individuals with NF1, NFNS, and Watson syndrome is ideally performed in an NF specialty clinic (PMID: 00174944). If that is not possible, the care team should consist of a geneticist/genetic counselor, a neurologist, an ophthalmologist, a dermatologist, a cardiologist, and potentially an orthopedist and an oncologist (PMID: 96759163, 84665993, 57017793, 90300923).  Initial evaluations upon diagnosis should include the following:   Obtain a thorough medical history with a specific focus on features associated with NF1 (PMID: 30076226)  Provide a thorough physical exam to evaluate for the presence of neurocutaneous, cardiovascular, and musculoskeletal features, tumors, and any other relevant findings (PMID: 33354562)  Obtain a detailed evaluation by an ophthalmologist (PMID: 56389373)  Obtain a developmental assessment (PMID: 42876811)  Obtain molecular testing of the proband's parents to  determine whether the proband has inherited the NF1 variant or if it has occurred de novo (PMID: 57262035)  Surveillance needs to include the following:  Annual evaluation at an NF specialty center or by a physician experienced in treating patients with NF1 (PMID: 59741638)  Regular developmental evaluations throughout childhood and implementation of additional support services, such as individualized education plans, occupational therapy, and behavioral therapy, when indicated (PMID: 35573220)  Annual ophthalmologic exam throughout childhood and less frequent evaluations in adulthood (PMID: 25427062)  Regular blood pressure monitoring (PMID: 37628315)  MRI or other imaging studies as indicated, based on clinical symptoms (PMID: 17616073)  Ongoing consultation with subspecialists to address cardiovascular, orthopedic, oncology, and neurological symptoms if they arise (PMID: 71062694)  NF1 patients who become pregnant should be referred to a perinatologist to monitor for hypertension and the development of neurofibromas that could complicate delivery (PMID: 85462703)  Annual mammogram with consideration of tomosynthesis starting at age 92 to monitor for breast cancer and consideration of breast MRI at ages 80 to 90 years (option of risk-reducingmastectomy should be based on family history; NCCN. Genetic/Familial High-Risk Assessment: Breast and Ovarian. Version 1.2020).  The risk for breast cancer seems to be elevated in women under age 20.  Therefore, we would not recommend a change in medical management based on Ms. Heinzelman' age.  Referral to an NF specialist for evaluation and management related to nerve sheath tumors, GIST, and other tumors (NCCN. Genetic/Familial High-Risk Assessment: Breast and Ovarian. Version 1.2020)  FAMILY MEMBERS: Knowing if a pathogenic variant in BRIP1 and/or NF1 is present is advantageous. At-risk relatives can be identified, enabling pursuit of a diagnostic  evaluation. Further, the available information regarding hereditary cancer susceptibility genes is constantly evolving and more clinically relevant data regarding BRIP1 are likely to become available in the near future. Awareness of this cancer predisposition encourages patients and their providers to inform at-risk family members, to diligently follow screening protocols, and to be vigilant in maintaining close and regular contact with their local genetics clinic in anticipation of new information. It is important that all of Ms. Kloeppel's relatives (both men and women) know of the presence of this gene mutation. Site-specific genetic testing can sort out who in the family is at risk and who is not.   Ms. Pete daughter, Anderson Malta, and her siblings have a 50% chance to have inherited each of these mutations. Each mutation inheritance is an independent event, and therefore her relatives could inherit one of the mutations or both.  We recommend they have genetic testing for both mutation, as identifying the presence of this mutation would allow them to also take advantage of risk-reducing measures.   PLAN: We recommended that Ms. Fishbaugh attend a Neurofibromatosis clinic in the area.  She was given information on two clinics, one at [facility] and the other at [facility].  We discussed that she should attend this clinic to determine if she has clinical symptoms of NF1 that need further follow up.During this visit, she and the providers can discuss whether she needs further follow up and come up with a plan that they are both comfortable.  Currently, Ms. Milhorn admits to having some small brown birthmarks, but denies having inguinal or axillary freckling, neurofibromas or plexiform neurofibromas.  We encouraged Ms. Partridge to remain in contact with [place] on an annual basis so we can update her personal and family histories, and let her know of advances in cancer genetics that may benefit  the family. Our contact number was provided. Ms. Aristizabal questions were answered to her satisfaction today, and she knows she is welcome to call anytime with additional questions.   Karen P. Florene Glen, [place], [facility] Certified Genetic Counselor [email or web address] phone: [phone]  he patient was seen for a total  of 45 minutes in face-to-face genetic counseling.  This patient was discussed with Drs. Magrinat, Lindi Adie and/or Burr Medico who agrees with the above.

## 2018-07-23 ENCOUNTER — Telehealth: Payer: Self-pay | Admitting: *Deleted

## 2018-07-23 NOTE — Telephone Encounter (Signed)
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  She met with Roma Kayser today and received a lot of information.  She does need to consider ovary removal and I would like her to be called and offered an appointment.  Thanks.  Vinnie Level

## 2018-07-23 NOTE — Telephone Encounter (Signed)
Spoke with patient, advised as seen below per Dr. Sabra Heck. OV scheduled for 3/23 at 1030 with Dr. Sabra Heck. Patient verbalizes understanding.   Routing to provider for final review. Patient is agreeable to disposition. Will close encounter.

## 2018-07-24 ENCOUNTER — Other Ambulatory Visit: Payer: Self-pay | Admitting: Adult Health

## 2018-07-24 DIAGNOSIS — Z1231 Encounter for screening mammogram for malignant neoplasm of breast: Secondary | ICD-10-CM

## 2018-08-01 ENCOUNTER — Telehealth: Payer: Self-pay | Admitting: *Deleted

## 2018-08-01 NOTE — Telephone Encounter (Signed)
Call to patient regarding appointment to discuss genetic results.  Due Coronavirus State of Emergency, unable to see patient in office.Advised of alternatives.  Patient requests phone visit to discuss results and possible option for ovary removal. Appointment scheduled for Tuesday, 08-06-2018 at 1000 with Dr Sabra Heck.   Routing to Dr Sabra Heck. Encounter closed.

## 2018-08-04 ENCOUNTER — Ambulatory Visit: Payer: Self-pay | Admitting: Obstetrics & Gynecology

## 2018-08-05 ENCOUNTER — Other Ambulatory Visit: Payer: Self-pay

## 2018-08-05 ENCOUNTER — Other Ambulatory Visit: Payer: Self-pay | Admitting: *Deleted

## 2018-08-05 ENCOUNTER — Other Ambulatory Visit: Payer: Self-pay | Admitting: Adult Health

## 2018-08-05 ENCOUNTER — Telehealth (INDEPENDENT_AMBULATORY_CARE_PROVIDER_SITE_OTHER): Payer: Medicare Other | Admitting: Obstetrics & Gynecology

## 2018-08-05 DIAGNOSIS — Z1502 Genetic susceptibility to malignant neoplasm of ovary: Secondary | ICD-10-CM | POA: Diagnosis not present

## 2018-08-05 NOTE — Progress Notes (Signed)
Virtual Visit via Telephone Note  I connected with Erin Hatfield on 08/05/18 at 10:00 AM EDT by telephone.  The patient called at the assigned time for her telephone visit and verified that I was speaking with the above individual.  She has already spoken with CMA, Lowell Bouton, and her identity had already bene verified using two identifiers.   I discussed the limitations, risks, security and privacy concerns of performing an evaluation and management service by telephone and the availability of in person appointments. I also discussed with the patient that there may be a patient responsible charge related to this service. The patient expressed understanding and agreed to proceed.  History of Present Illness: 68 yo femal with family hx of ovarian cancer in her sister.  Her sister underwent genetic testing and had a BRIPI mutation.  Pt was seen in genetic counseling with Erin Hearing and on 06/30/18, her genetic testing results were discussed.  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  The first mutation does carry with it an increased risk of ovarian cancer.  She is aware prophylactic BSO is recommended.  She is ready to proceed with this but is aware surgery cannot be scheduled at this time givenCovid 19 pandemic.  Imaging with PUS and ca-125 at this time would be helpful to ensure waiting for procedure would not add any additional risk.  Surgery was briefly discussed including actual procedure, outpatient nature of procedure and risks.   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  She is going to come for PUS and ca-125 to ensure this procedure should not be moved up at this time.  Pt very comfortable with plan.  The patient  was provided an opportunity to ask questions and all were answered. The patient agreed with the plan and demonstrated an understanding of the instructions.   I provided 18 minutes of non-face-to-face time during this encounter.   Megan Salon, MD

## 2018-08-06 NOTE — Telephone Encounter (Signed)
Sent to the pharmacy by e-scribe. 

## 2018-08-07 ENCOUNTER — Ambulatory Visit (INDEPENDENT_AMBULATORY_CARE_PROVIDER_SITE_OTHER): Payer: Medicare Other

## 2018-08-07 ENCOUNTER — Other Ambulatory Visit: Payer: Medicare Other

## 2018-08-07 ENCOUNTER — Ambulatory Visit (INDEPENDENT_AMBULATORY_CARE_PROVIDER_SITE_OTHER): Payer: Medicare Other | Admitting: Obstetrics & Gynecology

## 2018-08-07 ENCOUNTER — Other Ambulatory Visit: Payer: Medicare Other | Admitting: Obstetrics & Gynecology

## 2018-08-07 ENCOUNTER — Other Ambulatory Visit: Payer: Self-pay

## 2018-08-07 VITALS — BP 144/80 | HR 96 | Resp 16 | Ht 62.75 in | Wt 175.0 lb

## 2018-08-07 DIAGNOSIS — Z1502 Genetic susceptibility to malignant neoplasm of ovary: Secondary | ICD-10-CM

## 2018-08-07 NOTE — Progress Notes (Signed)
68 y.o. G43P1011 Married Erin Hatfield here for pelvic ultrasound due to recent diagnosis of BR1P1 gene mutation that is cause of ovarian cancer in her sister.  Pt is here for ultrasound and Ca-125 today in light of not being able to schedule any outpatient surgeries due to Covid 19.  Patient's last menstrual period was 05/14/2000 (approximate).  Contraception: PMP, hysterectomy  Findings:  UTERUS: surgically absent EMS: n/a ADNEXA: Left ovary:  2.2 x 0.9 x 0.9cm       Right ovary: 1.8 x 1.0 x 0.9cm.  Both ovaries appear atrophic CUL DE SAC: no free fluid  Discussion:  Findings reviewed.  Pt does want surgery as soon as safe and possible  Will check cas-125 today and results will be called to pt.  Repeat PUS in 6 months with Ca-125 if surgery cannot be done by that point.  Pt in agreement with plan.  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  Plan surgery when possible or repeat PUS/ca-125 in six months.  ~15 minutes spent with patient >50% of time was in face to face discussion of above.

## 2018-08-08 LAB — CA 125: Cancer Antigen (CA) 125: 7.5 U/mL (ref 0.0–38.1)

## 2018-08-10 ENCOUNTER — Encounter: Payer: Self-pay | Admitting: Obstetrics & Gynecology

## 2018-08-13 ENCOUNTER — Ambulatory Visit: Payer: Medicare Other | Admitting: Adult Health

## 2018-09-08 ENCOUNTER — Ambulatory Visit: Payer: Medicare Other

## 2018-09-16 ENCOUNTER — Other Ambulatory Visit: Payer: Self-pay

## 2018-09-16 ENCOUNTER — Other Ambulatory Visit: Payer: Self-pay | Admitting: Family Medicine

## 2018-09-16 ENCOUNTER — Other Ambulatory Visit (INDEPENDENT_AMBULATORY_CARE_PROVIDER_SITE_OTHER): Payer: Medicare Other

## 2018-09-16 DIAGNOSIS — E139 Other specified diabetes mellitus without complications: Secondary | ICD-10-CM

## 2018-09-16 LAB — HEMOGLOBIN A1C: Hgb A1c MFr Bld: 7.1 % — ABNORMAL HIGH (ref 4.6–6.5)

## 2018-09-17 ENCOUNTER — Encounter: Payer: Self-pay | Admitting: Adult Health

## 2018-09-17 ENCOUNTER — Other Ambulatory Visit: Payer: Self-pay

## 2018-09-17 ENCOUNTER — Ambulatory Visit (INDEPENDENT_AMBULATORY_CARE_PROVIDER_SITE_OTHER): Payer: Medicare Other | Admitting: Adult Health

## 2018-09-17 DIAGNOSIS — E139 Other specified diabetes mellitus without complications: Secondary | ICD-10-CM | POA: Diagnosis not present

## 2018-09-17 MED ORDER — METFORMIN HCL 500 MG PO TABS: ORAL_TABLET | ORAL | 0 refills | Status: DC

## 2018-09-17 NOTE — Progress Notes (Signed)
Virtual Visit via Video Note  I connected with Erin Hatfield on 09/17/18 at  8:00 AM EDT by a video enabled telemedicine application and verified that I am speaking with the correct person using two identifiers.  Location patient: home Location provider:work or home office Persons participating in the virtual visit: patient, provider  I discussed the limitations of evaluation and management by telemedicine and the availability of in person appointments. The patient expressed understanding and agreed to proceed.   HPI:  68 year old female who is being evaluated today for follow-up regarding diabetes mellitus.  She is currently prescribed metformin 500 mg twice daily.  Last A1c in January 2020 had increased from 6.5-7.3.  This increase was thought to be related to diet and she was asked to make lifestyle modifications.  Today her A1c C has improved to 7.1.  She monitors her blood sugar in the morning and reports readings between 135-140. She is eating a healthy breakfast, has a fruit smoothie in the afternoon and eats a light dinner. She denies any episodes of hypoglycemia    ROS: See pertinent positives and negatives per HPI.  Past Medical History:  Diagnosis Date  . Allergy   . Bunion, left foot   . Diabetes mellitus without complication ([place])   . Family history of breast cancer   . Family history of ovarian cancer   . Hot flashes   . Hypertension     Past Surgical History:  Procedure Laterality Date  . VAGINAL HYSTERECTOMY  2002   fibroids    Family History  Problem Relation Age of Onset  . Diabetes Sister   . Ovarian cancer Sister 74  . Breast cancer Mother 100  . Lung cancer Father        Smoker  . Diabetes Other   . Hypertension Other   . Diabetes Sister   . Breast cancer Niece 29  . Colon cancer Neg Hx       Current Outpatient Medications:  .  amitriptyline (ELAVIL) 50 MG tablet, Take 1 tablet (50 mg total) by mouth at bedtime., Disp: 90 tablet, Rfl: 3 .   aspirin 81 MG tablet, Take 81 mg by mouth daily.  , Disp: , Rfl:  .  atenolol (TENORMIN) 50 MG tablet, TAKE 1 TABLET BY MOUTH ONCE DAILY, Disp: 90 tablet, Rfl: 1 .  cetirizine (ZYRTEC) 10 MG tablet, Take 10 mg by mouth as needed. , Disp: , Rfl:  .  cholecalciferol (VITAMIN D) 1000 UNITS tablet, Take 1,000 Units by mouth daily.  , Disp: , Rfl:  .  Chromium-Cinnamon (CINNAMON PLUS CHROMIUM) 100-500 MCG-MG CAPS, Take by mouth 2 (two) times daily., Disp: , Rfl:  .  fenofibrate (TRICOR) 48 MG tablet, Take 1 tablet (48 mg total) by mouth daily., Disp: 90 tablet, Rfl: 3 .  fish oil-omega-3 fatty acids 1000 MG capsule, Take 1 g by mouth daily.  , Disp: , Rfl:  .  lisinopril (PRINIVIL,ZESTRIL) 10 MG tablet, TAKE 1 TABLET BY MOUTH ONCE DAILY, Disp: 90 tablet, Rfl: 3 .  metFORMIN (GLUCOPHAGE) 500 MG tablet, Take one tablet in the morning and two tablets in the evening, Disp: 270 tablet, Rfl: 0 .  Multiple Vitamin (MULTIVITAMIN) tablet, Take 1 tablet by mouth daily., Disp: , Rfl:  .  ONE TOUCH LANCETS MISC, 1 each by Does not apply route daily., Disp: 200 each, Rfl: 3 .  ONETOUCH VERIO test strip, USE 1 STRIP TO CHECK GLUCOSE ONCE DAILY, Disp: 100 each, Rfl: 3 .  simvastatin (  ZOCOR) 5 MG tablet, Take 1 tablet (5 mg total) by mouth daily., Disp: 90 tablet, Rfl: 3  EXAM:  VITALS per patient if applicable:  GENERAL: alert, oriented, appears well and in no acute distress  HEENT: atraumatic, conjunttiva clear, no obvious abnormalities on inspection of external nose and ears  NECK: normal movements of the head and neck  LUNGS: on inspection no signs of respiratory distress, breathing rate appears normal, no obvious gross SOB, gasping or wheezing  CV: no obvious cyanosis  MS: moves all visible extremities without noticeable abnormality  PSYCH/NEURO: pleasant and cooperative, no obvious depression or anxiety, speech and thought processing grossly intact  ASSESSMENT AND PLAN:  Discussed the following  assessment and plan:  Diabetes 1.5, managed as type 2 ([name]) - Plan: metFORMIN (GLUCOPHAGE) 500 MG tablet  - Will increase Metformin to 500 mg in the morning and 1000 mg in the evening. Follow up in 3 months or sooner if needed.    I discussed the assessment and treatment plan with the patient. The patient was provided an opportunity to ask questions and all were answered. The patient agreed with the plan and demonstrated an understanding of the instructions.   The patient was advised to call back or seek an in-person evaluation if the symptoms worsen or if the condition fails to improve as anticipated.   [name], NP

## 2018-09-26 ENCOUNTER — Telehealth: Payer: Self-pay | Admitting: *Deleted

## 2018-09-26 NOTE — Telephone Encounter (Signed)
Call to cell number. Able to reach patient. Desires to proceed with surgery on 10-13-2018. Currently out of town. Will call back on Monday to review instructions.

## 2018-09-29 ENCOUNTER — Other Ambulatory Visit: Payer: Self-pay | Admitting: *Deleted

## 2018-09-29 ENCOUNTER — Other Ambulatory Visit: Payer: Self-pay | Admitting: Obstetrics & Gynecology

## 2018-09-29 DIAGNOSIS — Z1502 Genetic susceptibility to malignant neoplasm of ovary: Secondary | ICD-10-CM

## 2018-09-29 NOTE — Progress Notes (Deleted)
68 y.o. G31P1011 Married [place] or [place] American female here for discussion of upcoming procedure Laparoscopic Bilateral Salpingo Oophorectomy on 10/13/18 planned due to ***.  Pre-op evaluation thus far has included ***.     Ob Hx:   Patient's last menstrual period was 05/14/2000 (approximate).          Sexually active: No. Birth control: no method Last pap: ? Last MMG: 09/05/17 BIRADS1:Neg. Has appt 10/27/18  Tobacco: No  Past Surgical History:  Procedure Laterality Date  . VAGINAL HYSTERECTOMY  2002   fibroids    Past Medical History:  Diagnosis Date  . Allergy   . Bunion, left foot   . Diabetes mellitus without complication ([place])   . Family history of breast cancer   . Family history of ovarian cancer   . Hot flashes   . Hypertension     Allergies: Patient has no known allergies.  Current Outpatient Medications  Medication Sig Dispense Refill  . amitriptyline (ELAVIL) 50 MG tablet Take 1 tablet (50 mg total) by mouth at bedtime. 90 tablet 3  . aspirin 81 MG tablet Take 81 mg by mouth daily.      Marland Kitchen atenolol (TENORMIN) 50 MG tablet TAKE 1 TABLET BY MOUTH ONCE DAILY 90 tablet 1  . cetirizine (ZYRTEC) 10 MG tablet Take 10 mg by mouth as needed.     . cholecalciferol (VITAMIN D) 1000 UNITS tablet Take 1,000 Units by mouth daily.      . Chromium-Cinnamon (CINNAMON PLUS CHROMIUM) 100-500 MCG-MG CAPS Take by mouth 2 (two) times daily.    . fenofibrate (TRICOR) 48 MG tablet Take 1 tablet (48 mg total) by mouth daily. 90 tablet 3  . fish oil-omega-3 fatty acids 1000 MG capsule Take 1 g by mouth daily.      Marland Kitchen lisinopril (PRINIVIL,ZESTRIL) 10 MG tablet TAKE 1 TABLET BY MOUTH ONCE DAILY 90 tablet 3  . metFORMIN (GLUCOPHAGE) 500 MG tablet Take one tablet in the morning and two tablets in the evening 270 tablet 0  . Multiple Vitamin (MULTIVITAMIN) tablet Take 1 tablet by mouth daily.    . ONE TOUCH LANCETS MISC 1 each by Does not apply route daily. 200 each 3  . ONETOUCH VERIO test strip  USE 1 STRIP TO CHECK GLUCOSE ONCE DAILY 100 each 3  . simvastatin (ZOCOR) 5 MG tablet Take 1 tablet (5 mg total) by mouth daily. 90 tablet 3   No current facility-administered medications for this visit.     ROS: {Ros - complete:30496}  Exam:    LMP 05/14/2000 (Approximate)   General appearance: alert and cooperative Head: Normocephalic, without obvious abnormality, atraumatic Neck: no adenopathy, supple, symmetrical, trachea midline and thyroid not enlarged, symmetric, no tenderness/mass/nodules Lungs: clear to auscultation bilaterally Heart: regular rate and rhythm, S1, S2 normal, no murmur, click, rub or gallop Abdomen: soft, non-tender; bowel sounds normal; no masses,  no organomegaly Extremities: extremities normal, atraumatic, no cyanosis or edema Skin: Skin color, texture, turgor normal. No rashes or lesions Lymph nodes: Cervical, supraclavicular, and axillary nodes normal. no inguinal nodes palpated Neurologic: Grossly normal  Pelvic: External genitalia:  no lesions              Urethra: normal appearing urethra with no masses, tenderness or lesions              Bartholins and Skenes: {EXAM; GYN QZRAQ:76226}                 Vagina: {vagina:315903::"normal appearing vagina with  normal color and discharge, no lesions"}              Cervix: {exam; gyn cervix:30847}              Pap taken: {yes no:314532}        Bimanual Exam:  Uterus:  {uterus:315905::"uterus is normal size, shape, consistency and nontender"}                                      Adnexa:    {adnexa:311645::"not indicated"}                                      Rectovaginal: Deferred                                      Anus:  {Exam; anus:16940}  A: {CCO Gynecologic Problems:21020265}     P:  *** planned Rx for Motrin and Percocet given. Medications/Vitamins reviewed.  Pt knows needs to stop ***. Hysterectomy brochure given for pre and post op instructions.

## 2018-09-29 NOTE — Telephone Encounter (Signed)
Call from patient. Surgery instructions reviewed, including Covid precautons and will provide printed copy at consult appointment in am.  Routing to provider. Encounter closed.

## 2018-09-30 ENCOUNTER — Ambulatory Visit: Payer: Medicare Other | Admitting: Obstetrics & Gynecology

## 2018-10-02 ENCOUNTER — Other Ambulatory Visit: Payer: Self-pay

## 2018-10-02 ENCOUNTER — Ambulatory Visit: Payer: Medicare Other | Admitting: Obstetrics & Gynecology

## 2018-10-02 ENCOUNTER — Ambulatory Visit (INDEPENDENT_AMBULATORY_CARE_PROVIDER_SITE_OTHER): Payer: Medicare Other

## 2018-10-02 ENCOUNTER — Encounter: Payer: Self-pay | Admitting: Obstetrics & Gynecology

## 2018-10-02 VITALS — BP 140/82 | HR 72 | Temp 97.5°F | Ht 62.75 in | Wt 171.0 lb

## 2018-10-02 DIAGNOSIS — Z8041 Family history of malignant neoplasm of ovary: Secondary | ICD-10-CM

## 2018-10-02 DIAGNOSIS — Z1502 Genetic susceptibility to malignant neoplasm of ovary: Secondary | ICD-10-CM

## 2018-10-02 DIAGNOSIS — Z8489 Family history of other specified conditions: Secondary | ICD-10-CM

## 2018-10-02 NOTE — Progress Notes (Signed)
68 y.o. G59P1011 Married Erin Hatfield here for discussion of upcoming procedure.  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     Ultrasound was performed today as it has been a few months since the last one due to Covid.  Both ovaries appear normal today.  Ca-125 3/26/202 was normal.    She's had no new medical or surgical issues since I last saw her.  Procedure reviewed again.  Procedure discussed with patient.  Hospital stay, recovery and pain management all discussed.  Risks discussed including but not limited to bleeding, <1% risk of receiving a  transfusion, infection, rare risk of bowel/bladder/ureteral/vascular injury discussed as well as possible need for additional surgery if injury does occur discussed.  DVT/PE and rare risk of death discussed.  My actual complications with prior surgeries discussed.  Hernia formation discussed.  Positioning and incision locations discussed.  Patient aware if pathology abnormal she may need additional treatment.  All questions answered.    Ob Hx:   Patient's last menstrual period was 05/14/2000 (approximate).          Sexually active: No. 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 VAGINAL HYSTERECTOMY  2002   fibroids    Past Medical History:  Diagnosis Date  . Allergy   . Bunion, left foot   . Diabetes mellitus without complication ([place])   . Family history of breast cancer   . Family history of ovarian cancer   . Hot flashes   . Hypertension     Allergies: Patient has no known allergies.  Current Outpatient Medications  Medication Sig Dispense Refill  . amitriptyline (ELAVIL) 50 MG tablet Take 1 tablet (50 mg total) by mouth at bedtime. 90 tablet 3  . aspirin 81 MG tablet Take 81 mg by mouth daily.      Marland Kitchen atenolol (TENORMIN) 50 MG tablet TAKE 1  TABLET BY MOUTH ONCE DAILY 90 tablet 1  . cetirizine (ZYRTEC) 10 MG tablet Take 10 mg by mouth as needed.     . cholecalciferol (VITAMIN D) 1000 UNITS tablet Take 1,000 Units by mouth daily.      . Chromium-Cinnamon (CINNAMON PLUS CHROMIUM) 100-500 MCG-MG CAPS Take by mouth 2 (two) times daily.    . fenofibrate (TRICOR) 48 MG tablet Take 1 tablet (48 mg total) by mouth daily. 90 tablet 3  . fish oil-omega-3 fatty acids 1000 MG capsule Take 1 g by mouth daily.      Marland Kitchen lisinopril (PRINIVIL,ZESTRIL) 10 MG tablet TAKE 1 TABLET BY MOUTH ONCE DAILY 90 tablet 3  . metFORMIN (GLUCOPHAGE) 500 MG tablet Take one tablet in the morning and two tablets in the evening 270 tablet 0  . Multiple Vitamin (MULTIVITAMIN) tablet Take 1 tablet by mouth daily.    . ONE TOUCH LANCETS MISC 1 each by Does not apply route daily. 200 each 3  . ONETOUCH VERIO test strip USE 1 STRIP TO CHECK GLUCOSE ONCE DAILY 100 each 3  . simvastatin (ZOCOR) 5 MG tablet Take 1 tablet (5 mg total) by mouth daily. 90 tablet 3   No current facility-administered medications for this visit.     ROS: A comprehensive review of systems was negative.  Exam:    BP 140/82   Pulse 72   Temp (!) 97.5 F (36.4 C) (Temporal)   Ht  5' 2.75" (1.594 m)   Wt 171 lb (77.6 kg)   LMP 05/14/2000 (Approximate)   BMI 30.53 kg/m   General appearance: alert and cooperative Head: Normocephalic, without obvious abnormality, atraumatic Neck: no adenopathy, supple, symmetrical, trachea midline and thyroid not enlarged, symmetric, no tenderness/mass/nodules Lungs: clear to auscultation bilaterally Heart: regular rate and rhythm, S1, S2 normal, no murmur, click, rub or gallop Abdomen: soft, non-tender; bowel sounds normal; no masses,  no organomegaly Extremities: extremities normal, atraumatic, no cyanosis or edema Skin: Skin color, texture, turgor normal. No rashes or lesions Lymph nodes: Cervical, supraclavicular, and axillary nodes normal. no inguinal  nodes palpated Neurologic: Grossly normal  Pelvic:No pelvic exam performed today  A: Increased genetic risk for ovarian cancer Family hx of ovarian cancer in sister    P:  Laparoscopic BSO planned Pre and post op instructions reviewed.  All questions answered.

## 2018-10-02 NOTE — Progress Notes (Deleted)
68 y.o. G51P1011 Married Erin Hatfield or [place] American female here for discussion of upcoming procedure Laparoscopic Bilateral Salpingo Oophorectomy on 10/13/18 planned due to ***.  Pre-op evaluation thus far has included ***.     Ob Hx:   Patient's last menstrual period was 05/14/2000 (approximate).          Sexually active: No. Birth control: no method Last pap: ? Last MMG: 09/05/17 BIRADS1:Neg  Tobacco: No  Past Surgical History:  Procedure Laterality Date  . VAGINAL HYSTERECTOMY  2002   fibroids    Past Medical History:  Diagnosis Date  . Allergy   . Bunion, left foot   . Diabetes mellitus without complication ([place])   . Family history of breast cancer   . Family history of ovarian cancer   . Hot flashes   . Hypertension     Allergies: Patient has no known allergies.  Current Outpatient Medications  Medication Sig Dispense Refill  . amitriptyline (ELAVIL) 50 MG tablet Take 1 tablet (50 mg total) by mouth at bedtime. 90 tablet 3  . aspirin 81 MG tablet Take 81 mg by mouth daily.      Marland Kitchen atenolol (TENORMIN) 50 MG tablet TAKE 1 TABLET BY MOUTH ONCE DAILY 90 tablet 1  . cetirizine (ZYRTEC) 10 MG tablet Take 10 mg by mouth as needed.     . cholecalciferol (VITAMIN D) 1000 UNITS tablet Take 1,000 Units by mouth daily.      . Chromium-Cinnamon (CINNAMON PLUS CHROMIUM) 100-500 MCG-MG CAPS Take by mouth 2 (two) times daily.    . fenofibrate (TRICOR) 48 MG tablet Take 1 tablet (48 mg total) by mouth daily. 90 tablet 3  . fish oil-omega-3 fatty acids 1000 MG capsule Take 1 g by mouth daily.      Marland Kitchen lisinopril (PRINIVIL,ZESTRIL) 10 MG tablet TAKE 1 TABLET BY MOUTH ONCE DAILY 90 tablet 3  . metFORMIN (GLUCOPHAGE) 500 MG tablet Take one tablet in the morning and two tablets in the evening 270 tablet 0  . Multiple Vitamin (MULTIVITAMIN) tablet Take 1 tablet by mouth daily.    . ONE TOUCH LANCETS MISC 1 each by Does not apply route daily. 200 each 3  . ONETOUCH VERIO test strip USE 1 STRIP TO  CHECK GLUCOSE ONCE DAILY 100 each 3  . simvastatin (ZOCOR) 5 MG tablet Take 1 tablet (5 mg total) by mouth daily. 90 tablet 3   No current facility-administered medications for this visit.     ROS: {Ros - complete:30496}  Exam:    BP 140/82   Pulse 72   Temp (!) 97.5 F (36.4 C) (Temporal)   Ht 5' 2.75" (1.594 m)   Wt 171 lb (77.6 kg)   LMP 05/14/2000 (Approximate)   BMI 30.53 kg/m   General appearance: alert and cooperative Head: Normocephalic, without obvious abnormality, atraumatic Neck: no adenopathy, supple, symmetrical, trachea midline and thyroid not enlarged, symmetric, no tenderness/mass/nodules Lungs: clear to auscultation bilaterally Heart: regular rate and rhythm, S1, S2 normal, no murmur, click, rub or gallop Abdomen: soft, non-tender; bowel sounds normal; no masses,  no organomegaly Extremities: extremities normal, atraumatic, no cyanosis or edema Skin: Skin color, texture, turgor normal. No rashes or lesions Lymph nodes: Cervical, supraclavicular, and axillary nodes normal. no inguinal nodes palpated Neurologic: Grossly normal  Pelvic: External genitalia:  no lesions              Urethra: normal appearing urethra with no masses, tenderness or lesions  Bartholins and Skenes: {EXAM; GYN [id number]                 Vagina: {vagina:315903::"normal appearing vagina with normal color and discharge, no lesions"}              Cervix: {exam; gyn cervix:30847}              Pap taken: {yes no:314532}        Bimanual Exam:  Uterus:  {uterus:315905::"uterus is normal size, shape, consistency and nontender"}                                      Adnexa:    {adnexa:311645::"not indicated"}                                      Rectovaginal: Deferred                                      Anus:  {Exam; anus:16940}  A: {CCO Gynecologic Problems:21020265}     P:  *** planned Rx for Motrin and Percocet given. Medications/Vitamins reviewed.  Pt knows needs to stop  ***. Hysterectomy brochure given for pre and post op instructions.

## 2018-10-07 ENCOUNTER — Telehealth: Payer: Self-pay | Admitting: *Deleted

## 2018-10-07 NOTE — Telephone Encounter (Signed)
Patient says she is still waiting on someone to call her for covid-19 testing before her surgery scheduled June 1st.

## 2018-10-07 NOTE — Telephone Encounter (Signed)
Call to [facility]. Confirmed patient will be contacted this afternoon or tomorrow am for Covid testing on 10-09-2018.  Call to patient. Update provided.   Encounter closed.

## 2018-10-08 ENCOUNTER — Encounter (HOSPITAL_BASED_OUTPATIENT_CLINIC_OR_DEPARTMENT_OTHER): Payer: Self-pay | Admitting: *Deleted

## 2018-10-08 ENCOUNTER — Other Ambulatory Visit: Payer: Self-pay

## 2018-10-08 NOTE — Progress Notes (Signed)
Spoke with Erin Hatfield, npo after midnight, food clear liquids unitl 430 am and drink G 2 drink, then npo after 430 am. meds to take atenolol, simvastatin. Arrive 745 am 10-13-2018 [facility]. Lab appointment 10-09-2018  830 am for cbc, bmet, ekg and covid test. Has surgery orders in epic. Spouse roger driver cell [phone]. Patient made aware of visitor restrictions prefer no visitors, 1 visitor mat stay.

## 2018-10-09 ENCOUNTER — Encounter (HOSPITAL_COMMUNITY)
Admission: RE | Admit: 2018-10-09 | Discharge: 2018-10-09 | Disposition: A | Discharge status: Home / Self Care | Payer: Medicare Other | Source: Ambulatory Visit | Attending: Obstetrics & Gynecology | Admitting: Obstetrics & Gynecology

## 2018-10-09 ENCOUNTER — Other Ambulatory Visit (HOSPITAL_COMMUNITY)
Admission: RE | Admit: 2018-10-09 | Discharge: 2018-10-09 | Disposition: A | Discharge status: Home / Self Care | Payer: Medicare Other | Source: Ambulatory Visit | Attending: Obstetrics & Gynecology | Admitting: Obstetrics & Gynecology

## 2018-10-09 DIAGNOSIS — I10 Essential (primary) hypertension: Secondary | ICD-10-CM | POA: Diagnosis not present

## 2018-10-09 DIAGNOSIS — Z1159 Encounter for screening for other viral diseases: Secondary | ICD-10-CM | POA: Insufficient documentation

## 2018-10-09 DIAGNOSIS — E118 Type 2 diabetes mellitus with unspecified complications: Secondary | ICD-10-CM | POA: Diagnosis not present

## 2018-10-09 DIAGNOSIS — Z01818 Encounter for other preprocedural examination: Secondary | ICD-10-CM | POA: Diagnosis not present

## 2018-10-09 LAB — CBC
HCT: 37.3 % (ref 36.0–46.0)
Hemoglobin: 11.3 g/dL — ABNORMAL LOW (ref 12.0–15.0)
MCH: 27.6 pg (ref 26.0–34.0)
MCHC: 30.3 g/dL (ref 30.0–36.0)
MCV: 91 fL (ref 80.0–100.0)
Platelets: 375 10*3/uL (ref 150–400)
RBC: 4.1 MIL/uL (ref 3.87–5.11)
RDW: 12.1 % (ref 11.5–15.5)
WBC: 7.1 10*3/uL (ref 4.0–10.5)
nRBC: 0 % (ref 0.0–0.2)

## 2018-10-09 LAB — BASIC METABOLIC PANEL
Anion gap: 8 (ref 5–15)
BUN: 11 mg/dL (ref 8–23)
CO2: 26 mmol/L (ref 22–32)
Calcium: 9.4 mg/dL (ref 8.9–10.3)
Chloride: 107 mmol/L (ref 98–111)
Creatinine, Ser: 0.9 mg/dL (ref 0.44–1.00)
GFR calc Af Amer: >60 mL/min (ref >60)
GFR calc non Af Amer: >60 mL/min (ref >60)
Glucose, Bld: 142 mg/dL — ABNORMAL HIGH (ref 70–99)
Potassium: 4.4 mmol/L (ref 3.5–5.1)
Sodium: 141 mmol/L (ref 135–145)

## 2018-10-10 LAB — NOVEL CORONAVIRUS, NAA (HOSP ORDER, SEND-OUT TO REF LAB; TAT 18-24 HRS): SARS-CoV-2, NAA: NOT DETECTED

## 2018-10-10 NOTE — Progress Notes (Signed)
Jessica zanetto pa aware of 10-09-18 ekg, no action needed

## 2018-10-10 NOTE — Progress Notes (Signed)
SPOKE W/  _unable to reach  Message left  Reminded to self quarantine   SCREENING SYMPTOMS OF COVID 19:   COUGH--  RUNNY NOSE---   SORE THROAT---  NASAL CONGESTION----  SNEEZING----  SHORTNESS OF BREATH---  DIFFICULTY BREATHING---  TEMP >100.0 -----  UNEXPLAINED BODY ACHES------  CHILLS --------   HEADACHES ---------  LOSS OF SMELL/ TASTE --------    HAVE YOU OR ANY FAMILY MEMBER TRAVELLED PAST 14 DAYS OUT OF THE   COUNTY--- STATE---- COUNTRY----  HAVE YOU OR ANY FAMILY MEMBER BEEN EXPOSED TO ANYONE WITH COVID 19?

## 2018-10-13 ENCOUNTER — Ambulatory Visit (HOSPITAL_BASED_OUTPATIENT_CLINIC_OR_DEPARTMENT_OTHER)
Admission: RE | Admit: 2018-10-13 | Discharge: 2018-10-13 | Disposition: A | Discharge status: Home / Self Care | Payer: Medicare Other | Attending: Obstetrics & Gynecology | Admitting: Obstetrics & Gynecology

## 2018-10-13 ENCOUNTER — Encounter (HOSPITAL_BASED_OUTPATIENT_CLINIC_OR_DEPARTMENT_OTHER)
Admission: RE | Disposition: A | Discharge status: Home / Self Care | Payer: Self-pay | Source: Home / Self Care | Attending: Obstetrics & Gynecology

## 2018-10-13 ENCOUNTER — Ambulatory Visit (HOSPITAL_BASED_OUTPATIENT_CLINIC_OR_DEPARTMENT_OTHER): Payer: Medicare Other | Admitting: Anesthesiology

## 2018-10-13 ENCOUNTER — Other Ambulatory Visit: Payer: Self-pay

## 2018-10-13 ENCOUNTER — Encounter (HOSPITAL_BASED_OUTPATIENT_CLINIC_OR_DEPARTMENT_OTHER): Payer: Self-pay | Admitting: Emergency Medicine

## 2018-10-13 ENCOUNTER — Ambulatory Visit (HOSPITAL_BASED_OUTPATIENT_CLINIC_OR_DEPARTMENT_OTHER): Payer: Medicare Other | Admitting: Physician Assistant

## 2018-10-13 DIAGNOSIS — E119 Type 2 diabetes mellitus without complications: Secondary | ICD-10-CM | POA: Insufficient documentation

## 2018-10-13 DIAGNOSIS — Z7982 Long term (current) use of aspirin: Secondary | ICD-10-CM | POA: Insufficient documentation

## 2018-10-13 DIAGNOSIS — Z1502 Genetic susceptibility to malignant neoplasm of ovary: Secondary | ICD-10-CM | POA: Insufficient documentation

## 2018-10-13 DIAGNOSIS — I10 Essential (primary) hypertension: Secondary | ICD-10-CM | POA: Diagnosis not present

## 2018-10-13 DIAGNOSIS — Z79899 Other long term (current) drug therapy: Secondary | ICD-10-CM | POA: Insufficient documentation

## 2018-10-13 DIAGNOSIS — Z7984 Long term (current) use of oral hypoglycemic drugs: Secondary | ICD-10-CM | POA: Diagnosis not present

## 2018-10-13 DIAGNOSIS — Z8041 Family history of malignant neoplasm of ovary: Secondary | ICD-10-CM

## 2018-10-13 DIAGNOSIS — Z803 Family history of malignant neoplasm of breast: Secondary | ICD-10-CM | POA: Insufficient documentation

## 2018-10-13 HISTORY — DX: Other specified postprocedural states: Z98.890

## 2018-10-13 HISTORY — PX: LAPAROSCOPIC BILATERAL SALPINGO OOPHERECTOMY: SHX5890

## 2018-10-13 HISTORY — DX: Other specified postprocedural states: R11.2

## 2018-10-13 HISTORY — DX: Other complications of anesthesia, initial encounter: T88.59XA

## 2018-10-13 LAB — GLUCOSE, CAPILLARY
Glucose-Capillary: 148 mg/dL — ABNORMAL HIGH (ref 70–99)
Glucose-Capillary: 118 mg/dL — ABNORMAL HIGH (ref 70–99)

## 2018-10-13 SURGERY — SALPINGO-OOPHORECTOMY, BILATERAL, LAPAROSCOPIC
Anesthesia: General | Laterality: Bilateral

## 2018-10-13 MED ORDER — LACTATED RINGERS IV SOLN
INTRAVENOUS | Status: DC
  Administered 2018-10-13 (×2): via INTRAVENOUS
  Filled 2018-10-13: qty 1000

## 2018-10-13 MED ORDER — ONDANSETRON HCL 4 MG/2ML IJ SOLN
INTRAMUSCULAR | Status: DC | PRN
  Administered 2018-10-13: 4 mg via INTRAVENOUS

## 2018-10-13 MED ORDER — EPHEDRINE SULFATE-NACL 50-0.9 MG/10ML-% IV SOSY
PREFILLED_SYRINGE | INTRAVENOUS | Status: DC | PRN
  Administered 2018-10-13 (×2): 10 mg via INTRAVENOUS

## 2018-10-13 MED ORDER — SUGAMMADEX SODIUM 200 MG/2ML IV SOLN
INTRAVENOUS | Status: DC | PRN
  Administered 2018-10-13: 175 mg via INTRAVENOUS

## 2018-10-13 MED ORDER — ROCURONIUM BROMIDE 10 MG/ML (PF) SYRINGE
PREFILLED_SYRINGE | INTRAVENOUS | Status: DC | PRN
  Administered 2018-10-13: 5 mg via INTRAVENOUS
  Administered 2018-10-13: 35 mg via INTRAVENOUS

## 2018-10-13 MED ORDER — SUCCINYLCHOLINE CHLORIDE 200 MG/10ML IV SOSY
PREFILLED_SYRINGE | INTRAVENOUS | Status: DC | PRN
  Administered 2018-10-13: 100 mg via INTRAVENOUS

## 2018-10-13 MED ORDER — BUPIVACAINE HCL 0.25 % IJ SOLN
INTRAMUSCULAR | Status: DC | PRN
  Administered 2018-10-13: 10 mL

## 2018-10-13 MED ORDER — FENTANYL CITRATE (PF) 100 MCG/2ML IJ SOLN
25.0000 ug | INTRAMUSCULAR | Status: DC | PRN
  Filled 2018-10-13: qty 1

## 2018-10-13 MED ORDER — LIDOCAINE 2% (20 MG/ML) 5 ML SYRINGE
INTRAMUSCULAR | Status: AC
  Filled 2018-10-13: qty 5

## 2018-10-13 MED ORDER — IBUPROFEN 800 MG PO TABS: 800.0000 mg | ORAL_TABLET | Freq: Three times a day (TID) | ORAL | 0 refills | Status: DC | PRN

## 2018-10-13 MED ORDER — HYDROCODONE-ACETAMINOPHEN 5-325 MG PO TABS: 1.0000 | ORAL_TABLET | Freq: Four times a day (QID) | ORAL | 0 refills | Status: DC | PRN

## 2018-10-13 MED ORDER — PHENYLEPHRINE 40 MCG/ML (10ML) SYRINGE FOR IV PUSH (FOR BLOOD PRESSURE SUPPORT)
PREFILLED_SYRINGE | INTRAVENOUS | Status: DC | PRN
  Administered 2018-10-13: 80 ug via INTRAVENOUS

## 2018-10-13 MED ORDER — ONDANSETRON HCL 4 MG/2ML IJ SOLN
INTRAMUSCULAR | Status: AC
  Filled 2018-10-13: qty 2

## 2018-10-13 MED ORDER — SODIUM CHLORIDE 0.9 % IR SOLN
Status: DC | PRN
  Administered 2018-10-13: 1000 mL

## 2018-10-13 MED ORDER — FENTANYL CITRATE (PF) 100 MCG/2ML IJ SOLN
INTRAMUSCULAR | Status: DC | PRN
  Administered 2018-10-13: 100 ug via INTRAVENOUS

## 2018-10-13 MED ORDER — PROPOFOL 10 MG/ML IV BOLUS
INTRAVENOUS | Status: DC | PRN
  Administered 2018-10-13: 150 mg via INTRAVENOUS

## 2018-10-13 MED ORDER — PROPOFOL 10 MG/ML IV BOLUS
INTRAVENOUS | Status: AC
  Filled 2018-10-13: qty 20

## 2018-10-13 MED ORDER — LIDOCAINE 2% (20 MG/ML) 5 ML SYRINGE
INTRAMUSCULAR | Status: DC | PRN
  Administered 2018-10-13: 80 mg via INTRAVENOUS

## 2018-10-13 MED ORDER — SUCCINYLCHOLINE CHLORIDE 200 MG/10ML IV SOSY
PREFILLED_SYRINGE | INTRAVENOUS | Status: AC
  Filled 2018-10-13: qty 10

## 2018-10-13 MED ORDER — KETOROLAC TROMETHAMINE 30 MG/ML IJ SOLN
INTRAMUSCULAR | Status: AC
  Filled 2018-10-13: qty 1

## 2018-10-13 MED ORDER — MIDAZOLAM HCL 5 MG/5ML IJ SOLN
INTRAMUSCULAR | Status: DC | PRN
  Administered 2018-10-13: 2 mg via INTRAVENOUS

## 2018-10-13 MED ORDER — MIDAZOLAM HCL 2 MG/2ML IJ SOLN
INTRAMUSCULAR | Status: AC
  Filled 2018-10-13: qty 2

## 2018-10-13 MED ORDER — ROCURONIUM BROMIDE 10 MG/ML (PF) SYRINGE
PREFILLED_SYRINGE | INTRAVENOUS | Status: AC
  Filled 2018-10-13: qty 10

## 2018-10-13 MED ORDER — PHENYLEPHRINE 40 MCG/ML (10ML) SYRINGE FOR IV PUSH (FOR BLOOD PRESSURE SUPPORT)
PREFILLED_SYRINGE | INTRAVENOUS | Status: AC
  Filled 2018-10-13: qty 10

## 2018-10-13 MED ORDER — DEXAMETHASONE SODIUM PHOSPHATE 10 MG/ML IJ SOLN
INTRAMUSCULAR | Status: AC
  Filled 2018-10-13: qty 1

## 2018-10-13 MED ORDER — FENTANYL CITRATE (PF) 100 MCG/2ML IJ SOLN
INTRAMUSCULAR | Status: AC
  Filled 2018-10-13: qty 2

## 2018-10-13 MED ORDER — PROMETHAZINE HCL 25 MG/ML IJ SOLN
6.2500 mg | INTRAMUSCULAR | Status: DC | PRN
  Filled 2018-10-13: qty 1

## 2018-10-13 MED ORDER — SUGAMMADEX SODIUM 500 MG/5ML IV SOLN
INTRAVENOUS | Status: AC
  Filled 2018-10-13: qty 5

## 2018-10-13 MED ORDER — DEXAMETHASONE SODIUM PHOSPHATE 10 MG/ML IJ SOLN
INTRAMUSCULAR | Status: DC | PRN
  Administered 2018-10-13: 10 mg via INTRAVENOUS

## 2018-10-13 SURGICAL SUPPLY — 71 items
APPLICATOR ARISTA FLEXITIP XL (MISCELLANEOUS) IMPLANT
BAG RETRIEVAL 10MM (BASKET) ×1
BENZOIN TINCTURE PRP APPL 2/3 (GAUZE/BANDAGES/DRESSINGS) IMPLANT
BLADE CLIPPER SENSICLIP SURGIC (BLADE) IMPLANT
CABLE HIGH FREQUENCY MONO STRZ (ELECTRODE) ×3 IMPLANT
CANISTER SUCT 3000ML PPV (MISCELLANEOUS) IMPLANT
CANISTER SUCTION 1200CC (MISCELLANEOUS) IMPLANT
CATH ROBINSON RED A/P 16FR (CATHETERS) IMPLANT
CLOSURE WOUND 1/4X4 (GAUZE/BANDAGES/DRESSINGS)
COVER MAYO STAND STRL (DRAPES) ×3 IMPLANT
COVER WAND RF STERILE (DRAPES) ×3 IMPLANT
DECANTER SPIKE VIAL GLASS SM (MISCELLANEOUS) ×3 IMPLANT
DERMABOND ADVANCED (GAUZE/BANDAGES/DRESSINGS) ×2
DERMABOND ADVANCED .7 DNX12 (GAUZE/BANDAGES/DRESSINGS) ×1 IMPLANT
DRSG COVADERM PLUS 2X2 (GAUZE/BANDAGES/DRESSINGS) IMPLANT
DRSG OPSITE POSTOP 3X4 (GAUZE/BANDAGES/DRESSINGS) IMPLANT
DRSG TELFA 3X8 NADH (GAUZE/BANDAGES/DRESSINGS) IMPLANT
DURAPREP 26ML APPLICATOR (WOUND CARE) ×3 IMPLANT
GAUZE 4X4 16PLY RFD (DISPOSABLE) ×6 IMPLANT
GLOVE BIO SURGEON STRL SZ 6.5 (GLOVE) ×2 IMPLANT
GLOVE BIO SURGEON STRL SZ7 (GLOVE) ×3 IMPLANT
GLOVE BIO SURGEON STRL SZ8.5 (GLOVE) ×3 IMPLANT
GLOVE BIO SURGEONS STRL SZ 6.5 (GLOVE) ×1
GLOVE BIOGEL PI IND STRL 7.0 (GLOVE) ×4 IMPLANT
GLOVE BIOGEL PI INDICATOR 7.0 (GLOVE) ×8
GLOVE ECLIPSE 6.5 STRL STRAW (GLOVE) ×6 IMPLANT
GLOVE INDICATOR 8.5 STRL (GLOVE) ×3 IMPLANT
GOWN STRL REUS W/ TWL XL LVL3 (GOWN DISPOSABLE) ×2 IMPLANT
GOWN STRL REUS W/TWL LRG LVL3 (GOWN DISPOSABLE) ×3 IMPLANT
GOWN STRL REUS W/TWL XL LVL3 (GOWN DISPOSABLE) ×4
HEMOSTAT ARISTA ABSORB 3G PWDR (HEMOSTASIS) IMPLANT
KIT TURNOVER CYSTO (KITS) ×3 IMPLANT
LIGASURE VESSEL 5MM BLUNT TIP (ELECTROSURGICAL) ×3 IMPLANT
NEEDLE HYPO 25X1 1.5 SAFETY (NEEDLE) ×3 IMPLANT
NEEDLE INSUFFLATION 120MM (ENDOMECHANICALS) ×3 IMPLANT
NEEDLE INSUFFLATION 14GA 150MM (NEEDLE) IMPLANT
NS IRRIG 500ML POUR BTL (IV SOLUTION) ×3 IMPLANT
PACK LAPAROSCOPY BASIN (CUSTOM PROCEDURE TRAY) ×3 IMPLANT
PACK TRENDGUARD 450 HYBRID PRO (MISCELLANEOUS) ×1 IMPLANT
PAD OB MATERNITY 4.3X12.25 (PERSONAL CARE ITEMS) ×3 IMPLANT
POUCH LAPAROSCOPIC INSTRUMENT (MISCELLANEOUS) ×3 IMPLANT
PROTECTOR NERVE ULNAR (MISCELLANEOUS) ×3 IMPLANT
SCISSORS LAP 5X35 DISP (ENDOMECHANICALS) IMPLANT
SEALER TISSUE G2 CVD JAW 35 (ENDOMECHANICALS) IMPLANT
SEALER TISSUE G2 CVD JAW 45CM (ENDOMECHANICALS)
SET IRRIG TUBING LAPAROSCOPIC (IRRIGATION / IRRIGATOR) ×3 IMPLANT
SHEARS HARMONIC ACE PLUS 36CM (ENDOMECHANICALS) ×3 IMPLANT
SLEEVE ADV FIXATION 5X100MM (TROCAR) ×3 IMPLANT
SOLUTION ELECTROLUBE (MISCELLANEOUS) IMPLANT
STRIP CLOSURE SKIN 1/4X4 (GAUZE/BANDAGES/DRESSINGS) IMPLANT
SUT VIC AB 4-0 SH 27 (SUTURE)
SUT VIC AB 4-0 SH 27XANBCTRL (SUTURE) IMPLANT
SUT VICRYL 0 UR6 27IN ABS (SUTURE) ×3 IMPLANT
SUT VICRYL 4-0 PS2 18IN ABS (SUTURE) ×3 IMPLANT
SYR 10ML LL (SYRINGE) ×3 IMPLANT
SYR 30ML LL (SYRINGE) IMPLANT
SYR 3ML 23GX1 SAFETY (SYRINGE) IMPLANT
SYS BAG RETRIEVAL 10MM (BASKET) ×2
SYSTEM BAG RETRIEVAL 10MM (BASKET) ×1 IMPLANT
SYSTEM CARTER THOMASON II (TROCAR) ×3 IMPLANT
TOWEL OR 17X26 10 PK STRL BLUE (TOWEL DISPOSABLE) ×6 IMPLANT
TRAY FOLEY W/BAG SLVR 14FR (SET/KITS/TRAYS/PACK) IMPLANT
TRENDGUARD 450 HYBRID PRO PACK (MISCELLANEOUS) ×3
TROCAR ADV FIXATION 5X100MM (TROCAR) ×3 IMPLANT
TROCAR XCEL NON-BLD 11X100MML (ENDOMECHANICALS) IMPLANT
TROCAR XCEL NON-BLD 5MMX100MML (ENDOMECHANICALS) ×3 IMPLANT
TUBE CONNECTING 12'X1/4 (SUCTIONS)
TUBE CONNECTING 12X1/4 (SUCTIONS) IMPLANT
TUBING EVAC SMOKE HEATED PNEUM (TUBING) ×3 IMPLANT
WARMER LAPAROSCOPE (MISCELLANEOUS) ×3 IMPLANT
WATER STERILE IRR 500ML POUR (IV SOLUTION) ×3 IMPLANT

## 2018-10-13 NOTE — H&P (Signed)
BRIA SPARR is an 68 y.o. female G31P1 MAAF here for laparoscopic BSO due to family hx of ovarian cancer and personal hx of BR1P1 gene mutation with personal increased risk of ovarian cancer.  She had a normal Ca-125 in March and a normal PUS in March and then again last week.  There really is not alternative to this procedure.  Risks and benefits have been discussed.  She is here and ready to proceed.  Pertinent Gynecological History: Menses: post-menopausal Bleeding: none Contraception: status post hysterectomy DES exposure: denies Blood transfusions: none Sexually transmitted diseases: no past history Previous GYN Procedures: TVH  Last mammogram: normal Date: 09/05/17 Last pap: not indicated  OB History: G2, P1   Menstrual History: Patient's last menstrual period was 05/14/2000 (approximate).    Past Medical History:  Diagnosis Date  . Allergy   . Bunion, left foot   . Complication of anesthesia   . Diabetes mellitus without complication ([place])    type 2  . Family history of breast cancer   . Family history of ovarian cancer   . Hot flashes   . Hypertension   . PONV (postoperative nausea and vomiting)     Past Surgical History:  Procedure Laterality Date  . VAGINAL HYSTERECTOMY  2002   fibroids partial    Family History  Problem Relation Age of Onset  . Diabetes Sister   . Ovarian cancer Sister 38  . Breast cancer Mother 32  . Lung cancer Father        Smoker  . Diabetes Other   . Hypertension Other   . Diabetes Sister   . Breast cancer Niece 80  . Colon cancer Neg Hx     Social History:  reports that she has never smoked. She has never used smokeless tobacco. She reports that she does not drink alcohol or use drugs.  Allergies: No Known Allergies  Medications Prior to Admission  Medication Sig Dispense Refill Last Dose  . amitriptyline (ELAVIL) 50 MG tablet Take 1 tablet (50 mg total) by mouth at bedtime. 90 tablet 3 10/12/2018 at Unknown time  .  aspirin 81 MG tablet Take 81 mg by mouth daily.     10/05/2018 at 2000  . atenolol (TENORMIN) 50 MG tablet TAKE 1 TABLET BY MOUTH ONCE DAILY 90 tablet 1 10/13/2018 at 0200  . cetirizine (ZYRTEC) 10 MG tablet Take 10 mg by mouth as needed.    Past Month at Unknown time  . cholecalciferol (VITAMIN D) 1000 UNITS tablet Take 1,000 Units by mouth daily.     Past Month at Unknown time  . fenofibrate (TRICOR) 48 MG tablet Take 1 tablet (48 mg total) by mouth daily. 90 tablet 3 10/12/2018 at Unknown time  . lisinopril (PRINIVIL,ZESTRIL) 10 MG tablet TAKE 1 TABLET BY MOUTH ONCE DAILY 90 tablet 3 10/12/2018 at Unknown time  . metFORMIN (GLUCOPHAGE) 500 MG tablet Take one tablet in the morning and two tablets in the evening 270 tablet 0 10/12/2018 at Unknown time  . simvastatin (ZOCOR) 5 MG tablet Take 1 tablet (5 mg total) by mouth daily. 90 tablet 3 10/13/2018 at 0200  . Chromium-Cinnamon (CINNAMON PLUS CHROMIUM) 100-500 MCG-MG CAPS Take by mouth 2 (two) times daily.   Taking  . fish oil-omega-3 fatty acids 1000 MG capsule Take 1 g by mouth daily.     Taking  . Multiple Vitamin (MULTIVITAMIN) tablet Take 1 tablet by mouth daily.   Taking  . ONE TOUCH LANCETS MISC 1 each  by Does not apply route daily. 200 each 3 Taking  . ONETOUCH VERIO test strip USE 1 STRIP TO CHECK GLUCOSE ONCE DAILY 100 each 3 Taking    Review of Systems  All other systems reviewed and are negative.   Blood pressure (!) 149/76, pulse 65, temperature 97.7 F (36.5 C), temperature source Oral, resp. rate 16, height 5\' 2"  (1.575 m), weight 76.7 kg, last menstrual period 05/14/2000, SpO2 100 %. Physical Exam  Constitutional: She is oriented to person, place, and time. She appears well-developed and well-nourished.  Cardiovascular: Normal rate and regular rhythm.  Respiratory: Effort normal and breath sounds normal.  Neurological: She is alert and oriented to person, place, and time.  Skin: Skin is warm and dry.  Psychiatric: She has a  normal mood and affect.    Results for orders placed or performed during the hospital encounter of 10/13/18 (from the past 24 hour(s))  Glucose, capillary     Status: Abnormal   Collection Time: 10/13/18  8:19 AM  Result Value Ref Range   Glucose-Capillary 148 (H) 70 - 99 mg/dL    No results found.  Assessment/Plan: 68 yo G2P1 MAAF with increased personal risk for ovarian cancer, h/o TVH, and history of sister with ovarian cancer here for BSO.  Pt ready to proceed.  Questions answered.  Megan Salon 10/13/2018, 9:36 AM

## 2018-10-13 NOTE — Transfer of Care (Signed)
Immediate Anesthesia Transfer of Care Note  Patient: Erin Hatfield  Procedure(s) Performed: LAPAROSCOPIC BILATERAL SALPINGO OOPHORECTOMY (Bilateral )  Patient Location: PACU  Anesthesia Type:General  Level of Consciousness: awake, alert  and oriented  Airway & Oxygen Therapy: Patient Spontanous Breathing and Patient connected to nasal cannula oxygen  Post-op Assessment: Report given to RN and Post -op Vital signs reviewed and stable  Post vital signs: Reviewed and stable  Last Vitals:  Vitals Value Taken Time  BP    Temp    Pulse 86 10/13/2018 11:06 AM  Resp 12 10/13/2018 11:06 AM  SpO2 100 % 10/13/2018 11:06 AM  Vitals shown include unvalidated device data.  Last Pain:  Vitals:   10/13/18 0827  TempSrc: Oral      Patients Stated Pain Goal: 3 (69/45/03 8882)  Complications: No apparent anesthesia complications

## 2018-10-13 NOTE — Anesthesia Procedure Notes (Signed)
Procedure Name: Intubation Date/Time: 10/13/2018 10:00 AM Performed by: Mialee Weyman D, CRNA Pre-anesthesia Checklist: Patient identified, Emergency Drugs available, Suction available and Patient being monitored Patient Re-evaluated:Patient Re-evaluated prior to induction Oxygen Delivery Method: Circle system utilized Preoxygenation: Pre-oxygenation with 100% oxygen Induction Type: IV induction and Rapid sequence Laryngoscope Size: Mac and 4 Grade View: Grade I Tube type: Oral Tube size: 7.0 mm Number of attempts: 1 Airway Equipment and Method: Stylet and Oral airway Placement Confirmation: ETT inserted through vocal cords under direct vision,  positive ETCO2 and breath sounds checked- equal and bilateral Secured at: 21 cm Tube secured with: Tape Dental Injury: Teeth and Oropharynx as per pre-operative assessment

## 2018-10-13 NOTE — Discharge Instructions (Addendum)
Post-surgical Instructions, Outpatient Surgery  You may expect to feel dizzy, weak, and drowsy for as long as 24 hours after receiving the medicine that made you sleep (anesthetic). For the first 24 hours after your surgery:    Do not drive a car, ride a bicycle, participate in physical activities, or take public transportation until you are done taking narcotic pain medicines or as directed by Dr. Sabra Heck.   Do not drink alcohol or take tranquilizers.   Do not take medicine that has not been prescribed by your physicians.   Do not sign important papers or make important decisions while on narcotic pain medicines.   Have a responsible person with you.   CARE OF INCISION  If you have a bandage, you may remove it in one day.  If there are steri-strips or dermabond, just let this loosen on its own.   You may shower on the first day after your surgery.  Do not sit in a tub bath for one week.  Avoid heavy lifting (more than 10 pounds/4.5 kilograms), pushing, or pulling.   Avoid activities that may risk injury to your incisions.   PAIN MANAGEMENT  Motrin 800mg .  (This is the same as 4-200mg  over the counter tablets of Motrin or ibuprofen.)  You may take this every eight hours or as needed for cramping.    Vicodin 5/325mg .  For more severe pain, take one or two tablets every four to six hours as needed for pain control.  (Remember that narcotic pain medications increase your risk of constipation.  If this becomes a problem, you may take an over the counter stool softener like Colace 100mg  up to four times a day.)  DO'S AND DON'T'S  Do not take a tub bath for one week.  You may shower on the first day after your surgery  Do not do any heavy lifting for one to two weeks.  This increases the chance of bleeding.  Do move around as you feel able.  Stairs are fine.  You may begin to exercise again as you feel able.  Do not lift any weights for two weeks.  Do not put anything in the vagina for  two weeks--no tampons, intercourse, or douching.    REGULAR MEDIATIONS/VITAMINS:  You may restart all of your regular medications as prescribed.  You may restart all of your vitamins as you normally take them.    PLEASE CALL OR SEEK MEDICAL CARE IF:  You have persistent nausea and vomiting.   You have trouble eating or drinking.   You have an oral temperature above 100.5.   You have constipation that is not helped by adjusting diet or increasing fluid intake. Pain medicines are a common cause of constipation.   You have heavy vaginal bleeding  You have redness or drainage from your incision(s) or there is increasing pain or tenderness near or in the surgical site.    Post Anesthesia Home Care Instructions  Activity: Get plenty of rest for the remainder of the day. A responsible adult should stay with you for 24 hours following the procedure.  For the next 24 hours, DO NOT: -Drive a car -Paediatric nurse -Drink alcoholic beverages -Take any medication unless instructed by your physician -Make any legal decisions or sign important papers.  Meals: Start with liquid foods such as gelatin or soup. Progress to regular foods as tolerated. Avoid greasy, spicy, heavy foods. If nausea and/or vomiting occur, drink only clear liquids until the nausea and/or vomiting subsides.  Call your physician if vomiting continues.  Special Instructions/Symptoms: Your throat may feel dry or sore from the anesthesia or the breathing tube placed in your throat during surgery. If this causes discomfort, gargle with warm salt water. The discomfort should disappear within 24 hours.  If you had a scopolamine patch placed behind your ear for the management of post- operative nausea and/or vomiting:  1. The medication in the patch is effective for 72 hours, after which it should be removed.  Wrap patch in a tissue and discard in the trash. Wash hands thoroughly with soap and water. 2. You may remove the  patch earlier than 72 hours if you experience unpleasant side effects which may include dry mouth, dizziness or visual disturbances. 3. Avoid touching the patch. Wash your hands with soap and water after contact with the patch.

## 2018-10-13 NOTE — Anesthesia Preprocedure Evaluation (Signed)
Anesthesia Evaluation  Patient identified by MRN, date of birth, ID band Patient awake    Reviewed: Allergy & Precautions, NPO status , Patient's Chart, lab work & pertinent test results, reviewed documented beta blocker date and time   Airway Mallampati: II  TM Distance: >3 FB Neck ROM: Full    Dental  (+) Dental Advisory Given   Pulmonary neg pulmonary ROS,    breath sounds clear to auscultation       Cardiovascular hypertension, Pt. on medications and Pt. on home beta blockers  Rhythm:Regular Rate:Normal     Neuro/Psych negative neurological ROS     GI/Hepatic negative GI ROS, Neg liver ROS,   Endo/Other  diabetes, Type 2, Oral Hypoglycemic Agents  Renal/GU negative Renal ROS     Musculoskeletal   Abdominal   Peds  Hematology negative hematology ROS (+)   Anesthesia Other Findings   Reproductive/Obstetrics                             Lab Results  Component Value Date   WBC 7.1 10/09/2018   HGB 11.3 (L) 10/09/2018   HCT 37.3 10/09/2018   MCV 91.0 10/09/2018   PLT 375 10/09/2018   Lab Results  Component Value Date   CREATININE 0.90 10/09/2018   BUN 11 10/09/2018   NA 141 10/09/2018   K 4.4 10/09/2018   CL 107 10/09/2018   CO2 26 10/09/2018    Anesthesia Physical Anesthesia Plan  ASA: II  Anesthesia Plan: General   Post-op Pain Management:    Induction: Intravenous  PONV Risk Score and Plan: 3 and Dexamethasone, Ondansetron and Treatment may vary due to age or medical condition  Airway Management Planned: Oral ETT  Additional Equipment:   Intra-op Plan:   Post-operative Plan: Extubation in OR  Informed Consent: I have reviewed the patients History and Physical, chart, labs and discussed the procedure including the risks, benefits and alternatives for the proposed anesthesia with the patient or authorized representative who has indicated his/her understanding and  acceptance.     Dental advisory given  Plan Discussed with: CRNA  Anesthesia Plan Comments:         Anesthesia Quick Evaluation

## 2018-10-13 NOTE — Op Note (Signed)
10/13/2018  11:03 AM  PATIENT:  Erin Hatfield  68 y.o. female  PRE-OPERATIVE DIAGNOSIS:  Increased genetic risk for ovarian cancer, BR1P1 positive testing, sister with ovarian cancer, h/o TVH  POST-OPERATIVE DIAGNOSIS:  same  PROCEDURE:  Procedure(s): LAPAROSCOPIC BILATERAL SALPINGO OOPHORECTOMY  SURGEON:  Megan Salon  ASSISTANTS: Sumner Boast, MD   ANESTHESIA:   general  ESTIMATED BLOOD LOSS: 5 mL  BLOOD ADMINISTERED:none   FLUIDS: 1200cc LR  UOP: 150cc clear UOP  SPECIMEN:  Pelvic washings, Bilateral ovaries and tubes  DISPOSITION OF SPECIMEN:  PATHOLOGY  FINDINGS: normal upper abdomen, small atrophic ovaries, mild adhesions to near bladder on right side  DESCRIPTION OF OPERATION: Patient is taken to the operating room. She is placed in the supine position. She is a running IV in place. Informed consent was present on the chart. SCDs on her lower extremities and functioning properly. Patient was positioned while she was awake.  Her legs were placed in the low lithotomy position in [place]. Her arms were tucked by the side.  General endotracheal anesthesia was administered by the anesthesia staff without difficulty. Dr. Ola Spurr, anesthesia, oversaw case.  Time out performed.    Chlora prep was then used to prep the abdomen and Betadine was used to prep the inner thighs, perineum and vagina. Once 3 minutes had past the patient was draped in a normal standard fashion. The legs were lifted to the high lithotomy position.  A sponge stick was placed in the vagina at the beginning of the procedure. A Foley catheter was placed to straight drain.  Clear urine was noted. Legs were lowered to the low lithotomy position and attention was turned the abdomen.  The umbilicus was everted.  A Veress needle was obtained. Syringe of sterile saline was placed on a open Veress needle.  This was passed into the umbilicus until just when the fluid started to drip.  Then low flow CO2 gas  was attached the needle and the pneumoperitoneum was achieved without difficulty. Once four liters of gas was in the abdomen the Veress needle was removed and a 5 millimeter non-bladed Optiview trocar and port were passed directly to the abdomen. The laparoscope was then used to confirm intraperitoneal placement.  Uterus was surgically absent. There were some thick and thin adhesions on the right side to the bladder and on the left side to omentum.  Ovaries appeared atrophic.  Locations for RLQ and LLQ ports were noted by transillumination of the abdominal wall.  0.25% marcaine was used to anesthetize the skin.  10 mm skin incision was made in the RLQ and 40mm non-bladed trochar and port were placed.  Then a 88mm skin incision was made and a 48mm nonbladed trochar and port was placed in the RLQ.  All trochars were removed.  Pelvic washings were obtained.    Ureters were identifies.  Attention was turned to the right side.  The ovary was lifted away from the sidewall and lifted away from the bladder.  The adhesions along the edge of the bladder were transected using the Harmonic scalpel.  Then the IP ligament was serially clamped, cauterized and incised freeing the ovary.  This was placed in the pelvis.  Attention was turned the left side.  The ureter was see low in the pelvis on this side as well.  The adhesions to the omentum were transected with the harmonic scalpel.  Then the ovary was retracted from the left side wall.  The IP ligament was then  serially clamped, cauterized and incised with the ligasure device.  At this point, both ovaries were free.  An endocatch bag was placed in the pelvis and both specimens were placed in the bag. This was brought through the LLQ incision without difficulty.    The LLQ incision was closed at the fascial layer with the Carter-Thomasson closure device.  This incision was close adequately.  Pelvis was irrigated.  No bleeding was noted.  CO2 pressures were lowered to ensure  there was no delayed bleeding.  There was not.    At this point, the procedure was ended.  Instruments were removed.  Pneumoperitoneum was relieved.  Pt was taken out of Trendelenberg positioning and several good deep breaths were given to ensure all gas was removed.  At this point, the remaining trochars were removed.    The skin was then closed with subcuticular stitches of 3-0 Vicryl. The skin was cleansed Dermabond was applied. Attention was then turned the vagina and the sponge stick was removed.  Foley was removed.  Sponge, lap, needle, initially counts were correct x2. Patient tolerated the procedure very well. She was awakened from anesthesia, extubated and taken to recovery in stable condition.   COUNTS:  YES  PLAN OF CARE: Transfer to PACU

## 2018-10-14 ENCOUNTER — Encounter (HOSPITAL_BASED_OUTPATIENT_CLINIC_OR_DEPARTMENT_OTHER): Payer: Self-pay | Admitting: Obstetrics & Gynecology

## 2018-10-15 NOTE — Anesthesia Postprocedure Evaluation (Signed)
Anesthesia Post Note  Patient: Erin Hatfield  Procedure(s) Performed: LAPAROSCOPIC BILATERAL SALPINGO OOPHORECTOMY (Bilateral )     Patient location during evaluation: PACU Anesthesia Type: General Level of consciousness: awake and alert Pain management: pain level controlled Vital Signs Assessment: post-procedure vital signs reviewed and stable Respiratory status: spontaneous breathing, nonlabored ventilation, respiratory function stable and patient connected to nasal cannula oxygen Cardiovascular status: blood pressure returned to baseline and stable Postop Assessment: no apparent nausea or vomiting Anesthetic complications: no    Last Vitals:  Vitals:   10/13/18 1145 10/13/18 1235  BP: 129/70 (!) 142/80  Pulse: 77 75  Resp: (!) 23 14  Temp:  36.7 C  SpO2: 100% 98%    Last Pain:  Vitals:   10/13/18 1235  TempSrc:   PainSc: 0-No pain                 Tiajuana Amass

## 2018-10-27 ENCOUNTER — Ambulatory Visit
Admission: RE | Admit: 2018-10-27 | Discharge: 2018-10-27 | Disposition: A | Discharge status: Home / Self Care | Payer: Medicare Other | Source: Ambulatory Visit | Attending: Adult Health | Admitting: Adult Health

## 2018-10-27 ENCOUNTER — Other Ambulatory Visit: Payer: Self-pay

## 2018-10-27 DIAGNOSIS — Z1231 Encounter for screening mammogram for malignant neoplasm of breast: Secondary | ICD-10-CM

## 2018-10-30 ENCOUNTER — Encounter: Payer: Self-pay | Admitting: Obstetrics & Gynecology

## 2018-10-30 ENCOUNTER — Other Ambulatory Visit: Payer: Self-pay

## 2018-10-30 ENCOUNTER — Ambulatory Visit (INDEPENDENT_AMBULATORY_CARE_PROVIDER_SITE_OTHER): Payer: Medicare Other | Admitting: Obstetrics & Gynecology

## 2018-10-30 VITALS — BP 120/70 | HR 68 | Temp 97.6°F | Ht 62.0 in | Wt 171.0 lb

## 2018-10-30 DIAGNOSIS — Z9889 Other specified postprocedural states: Secondary | ICD-10-CM

## 2018-10-30 NOTE — Progress Notes (Signed)
Post Operative Visit  Procedure: Laparoscopic Bilateral Salpingo Oophorectomy.  Days Post-op: 17  Subjective: Doing well.  Denies vaginal bleeding.  Took only ibuprofen post op.  Bowel function resumed to normal function after about a day post op  Bladder function is normal.  Pathology has already been called to her but reviewed again today.  Copy provided at her request.  Objective: BP 120/70   Pulse 68   Temp 97.6 F (36.4 C) (Temporal)   Ht 5\' 2"  (1.575 m)   Wt 171 lb (77.6 kg)   LMP 05/14/2000 (Approximate)   BMI 31.28 kg/m   EXAM General: alert, cooperative and no distress GI: soft, non-tender; bowel sounds normal; no masses,  no organomegaly and incision: clean, dry and intact Extremities: extremities normal, atraumatic, no cyanosis or edema Vaginal Bleeding: none  Assessment: s/p BSO due to genetic increased risk of ovarian cancer  Plan: Return if needed for new gyn issues.  Will continue care with [facility].

## 2018-11-21 ENCOUNTER — Other Ambulatory Visit: Payer: Self-pay

## 2018-11-21 ENCOUNTER — Ambulatory Visit: Payer: Medicare Other | Admitting: Obstetrics & Gynecology

## 2018-11-21 ENCOUNTER — Encounter: Payer: Self-pay | Admitting: Obstetrics & Gynecology

## 2018-11-21 ENCOUNTER — Telehealth: Payer: Self-pay | Admitting: Obstetrics & Gynecology

## 2018-11-21 VITALS — BP 144/82 | HR 86 | Temp 97.3°F | Resp 14 | Ht 61.0 in | Wt 170.2 lb

## 2018-11-21 DIAGNOSIS — Z9889 Other specified postprocedural states: Secondary | ICD-10-CM

## 2018-11-21 NOTE — Progress Notes (Signed)
GYNECOLOGY  VISIT  CC:   Incision check  HPI: 68 y.o. G26P1011 Married Erin Hatfield American female here for drainage from navel incision.  She noted that there was some wetness last night and today.  She does not have any tenderness or bleeding.  Denies fever.  GYNECOLOGIC HISTORY: Patient's last menstrual period was 05/14/2000 (approximate). Contraception: Hysterectomy  Menopausal hormone therapy: none   Patient Active Problem List   Diagnosis Date Noted  . 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 Family history of breast cancer   . Family history of ovarian cancer   . Routine general medical examination at a health care facility 04/26/2015  . 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 Atrophic vaginitis 03/24/2012  . Hematuria, microscopic 03/24/2012  . FOOT PAIN, BILATERAL 03/23/2010  . MENOPAUSE-RELATED VASOMOTOR SYMPTOMS, HOT FLASHES 01/26/2008  . CERUMEN IMPACTION 11/03/2007  . ALLERGIC RHINITIS 10/27/2007  . Essential hypertension 01/20/2007    Past Medical History:  Diagnosis Date  . Allergy   . Bunion, left foot   . Complication of anesthesia   . Diabetes mellitus without complication ([place])    type 2  . Family history of breast cancer   . Family history of ovarian cancer   . Hot flashes   . Hypertension   . PONV (postoperative nausea and vomiting)     Past Surgical History:  Procedure Laterality Date  . LAPAROSCOPIC BILATERAL SALPINGO OOPHERECTOMY Bilateral 10/13/2018   Procedure: LAPAROSCOPIC BILATERAL SALPINGO OOPHORECTOMY;  Surgeon: Megan Salon, MD;  Location: [facility];  Service: Gynecology;  Laterality: Bilateral;  . VAGINAL HYSTERECTOMY  2002   fibroids partial    MEDS:   Current Outpatient Medications on File Prior to Visit  Medication Sig Dispense Refill  . amitriptyline (ELAVIL) 50 MG tablet Take 1 tablet (50 mg total) by mouth at bedtime. 90 tablet 3   . aspirin 81 MG tablet Take 81 mg by mouth daily.      Marland Kitchen atenolol (TENORMIN) 50 MG tablet TAKE 1 TABLET BY MOUTH ONCE DAILY 90 tablet 1  . cetirizine (ZYRTEC) 10 MG tablet Take 10 mg by mouth as needed.     . cholecalciferol (VITAMIN D) 1000 UNITS tablet Take 1,000 Units by mouth daily.      . Chromium-Cinnamon (CINNAMON PLUS CHROMIUM) 100-500 MCG-MG CAPS Take by mouth 2 (two) times daily.    . fenofibrate (TRICOR) 48 MG tablet Take 1 tablet (48 mg total) by mouth daily. 90 tablet 3  . fish oil-omega-3 fatty acids 1000 MG capsule Take 1 g by mouth daily.      Marland Kitchen ibuprofen (ADVIL) 800 MG tablet Take 1 tablet (800 mg total) by mouth every 8 (eight) hours as needed. 30 tablet 0  . lisinopril (PRINIVIL,ZESTRIL) 10 MG tablet TAKE 1 TABLET BY MOUTH ONCE DAILY 90 tablet 3  . metFORMIN (GLUCOPHAGE) 500 MG tablet Take one tablet in the morning and two tablets in the evening 270 tablet 0  . Multiple Vitamin (MULTIVITAMIN) tablet Take 1 tablet by mouth daily.    . ONE TOUCH LANCETS MISC 1 each by Does not apply route daily. 200 each 3  . ONETOUCH VERIO test strip USE 1 STRIP TO CHECK GLUCOSE ONCE DAILY 100 each 3  . simvastatin (ZOCOR) 5 MG tablet Take 1 tablet (5 mg total) by mouth daily. 90 tablet 3   No current facility-administered medications on file prior to visit.  ALLERGIES: Patient has no known allergies.  Family History  Problem Relation Age of Onset  . Diabetes Sister   . Ovarian cancer Sister 67  . Breast cancer Mother 68  . Lung cancer Father        Smoker  . Diabetes Other   . Hypertension Other   . Diabetes Sister   . Breast cancer Niece 69  . Colon cancer Neg Hx     SH:  Married, non smoker  Review of Systems  Genitourinary:       Tenderness at navel Discharge from navel incision    All other systems reviewed and are negative.   PHYSICAL EXAMINATION:    BP (!) 144/82 (BP Location: Right Arm, Patient Position: Sitting, Cuff Size: Normal)   Pulse 86   Temp (!)  97.3 F (36.3 C) (Temporal)   Resp 14   Ht 5\' 1"  (1.549 m)   Wt 170 lb 4 oz (77.2 kg)   LMP 05/14/2000 (Approximate)   BMI 32.17 kg/m     General appearance: alert, cooperative and appears stated age Abdomen: soft, non-tender; bowel sounds normal; no masses,  no organomegaly Inc: umbilical incision is opened about 25mm, this was probed and is superficial, no additional drainage, non-tender  Assessment: Superficial incision opening in umbilical incision   Plan:   Area dressed.  Precautions given.  She is going to keep cleaning and dressing the incision.  Once there is no drainage, she can stop using a dressing.  Can recheck in 2 weeks if she would like.  She will watch and call if wants appt.

## 2018-11-21 NOTE — Telephone Encounter (Signed)
Made appointment for patient to see Dr.Miller today at 4:30pm for evaluation.

## 2018-11-21 NOTE — Telephone Encounter (Signed)
Patient states had Lap.BSO on 10-13-18. In past few days has developed some tenderness in navel area--more internally instead of externally. Today has notice some sticky drainage from this area. Patient denies any fever or redness/irritation.

## 2018-11-21 NOTE — Telephone Encounter (Signed)
Patient is calling to report a soreness around her navel and a "sticky substance coming out of her navel area" .Patient states she had surgery recently.

## 2018-11-25 ENCOUNTER — Ambulatory Visit: Payer: Medicare Other | Admitting: Obstetrics & Gynecology

## 2018-12-08 ENCOUNTER — Other Ambulatory Visit: Payer: Self-pay | Admitting: Adult Health

## 2018-12-08 DIAGNOSIS — I10 Essential (primary) hypertension: Secondary | ICD-10-CM

## 2018-12-09 NOTE — Telephone Encounter (Signed)
SENT TO THE PHARMACY BY E-SCRIBE FOR 6 MONTHS.

## 2018-12-22 LAB — HM DIABETES EYE EXAM

## 2019-01-07 ENCOUNTER — Other Ambulatory Visit: Payer: Self-pay | Admitting: Adult Health

## 2019-01-07 DIAGNOSIS — E139 Other specified diabetes mellitus without complications: Secondary | ICD-10-CM

## 2019-01-09 NOTE — Telephone Encounter (Signed)
Sent to the pharmacy by e-scribe for 90 days.  Pt now scheduled for lab and telephone follow up.

## 2019-01-12 ENCOUNTER — Other Ambulatory Visit: Payer: Self-pay

## 2019-01-12 ENCOUNTER — Other Ambulatory Visit: Payer: Self-pay | Admitting: Adult Health

## 2019-01-12 ENCOUNTER — Other Ambulatory Visit (INDEPENDENT_AMBULATORY_CARE_PROVIDER_SITE_OTHER): Payer: Medicare Other

## 2019-01-12 DIAGNOSIS — E139 Other specified diabetes mellitus without complications: Secondary | ICD-10-CM | POA: Diagnosis not present

## 2019-01-12 LAB — HEMOGLOBIN A1C: Hgb A1c MFr Bld: 7 % — ABNORMAL HIGH (ref 4.6–6.5)

## 2019-01-13 ENCOUNTER — Ambulatory Visit (INDEPENDENT_AMBULATORY_CARE_PROVIDER_SITE_OTHER): Payer: Medicare Other | Admitting: Adult Health

## 2019-01-13 ENCOUNTER — Encounter: Payer: Self-pay | Admitting: Adult Health

## 2019-01-13 DIAGNOSIS — E139 Other specified diabetes mellitus without complications: Secondary | ICD-10-CM | POA: Diagnosis not present

## 2019-01-13 NOTE — Progress Notes (Signed)
Virtual Visit via Telephone Note  I connected with Erin Hatfield on 01/13/19 at  1:30 PM EDT by telephone and verified that I am speaking with the correct person using two identifiers.   I discussed the limitations, risks, security and privacy concerns of performing an evaluation and management service by telephone and the availability of in person appointments. I also discussed with the patient that there may be a patient responsible charge related to this service. The patient expressed understanding and agreed to proceed.  Location patient: home Location provider: work or home office Participants present for the call: patient, provider Patient did not have a visit in the prior 7 days to address this/these issue(s).   History of Present Illness: 68 year old female who is being evaluated today for a month follow-up regarding diabetes.  She is currently maintained on metformin 500 mg in the morning and 1000 mg in the evening.  Last A1c was 7.1.  The last 3 months, she has been having to deal with her sister poor health from ovarian cancer, her sister passed away and the burial was last week.  Understandably, her diet has not been what she wants it to be but she has started working on that again and is trying to exercise, she does report a couple pounds of weight loss.  Blood sugars have been ranging between 130 and 150  He denies any side effects of the medication and overall feels fairly well.   Observations/Objective: Patient sounds cheerful and well on the phone. I do not appreciate any SOB. Speech and thought processing are grossly intact. Patient reported vitals:  Assessment and Plan: 1. Diabetes 1.5, managed as type 2 ([place]) -A1c is 7.0.  Will continue on current dose of metformin.  We will follow-up in January at her physical exam.  Encouraged heart healthy diet and frequent exercise.  Follow Up Instructions:  I did not refer this patient for an OV in the next 24 hours for  this/these issue(s).  I discussed the assessment and treatment plan with the patient. The patient was provided an opportunity to ask questions and all were answered. The patient agreed with the plan and demonstrated an understanding of the instructions.   The patient was advised to call back or seek an in-person evaluation if the symptoms worsen or if the condition fails to improve as anticipated.  I provided 15 minutes of non-face-to-face time during this encounter.   [name], NP

## 2019-02-05 ENCOUNTER — Telehealth: Payer: Self-pay

## 2019-02-05 NOTE — Telephone Encounter (Signed)
Copied from [place] [id number]. Topic: General - Other >> Feb 05, 2019 11:18 AM Erin Hatfield wrote: Reason for CRM: genetic testing completed, pt came back positive for ovarian cancer. Pt says that she would like to discuss with PCP (because he works at [employer])   [place]: [phone]

## 2019-02-07 ENCOUNTER — Other Ambulatory Visit: Payer: Self-pay

## 2019-02-07 ENCOUNTER — Ambulatory Visit (INDEPENDENT_AMBULATORY_CARE_PROVIDER_SITE_OTHER): Payer: Medicare Other

## 2019-02-07 DIAGNOSIS — Z23 Encounter for immunization: Secondary | ICD-10-CM | POA: Diagnosis not present

## 2019-02-26 ENCOUNTER — Encounter: Payer: Self-pay | Admitting: Family Medicine

## 2019-04-06 ENCOUNTER — Other Ambulatory Visit: Payer: Self-pay | Admitting: Adult Health

## 2019-04-06 DIAGNOSIS — E139 Other specified diabetes mellitus without complications: Secondary | ICD-10-CM

## 2019-04-06 MED ORDER — METFORMIN HCL 500 MG PO TABS: ORAL_TABLET | ORAL | 0 refills | Status: DC

## 2019-04-06 NOTE — Telephone Encounter (Signed)
Copied from [place] [id number]. Topic: Quick Communication - Rx Refill/Question >> Apr 06, 2019  9:49 AM Leward Quan A wrote: Medication: metFORMIN (GLUCOPHAGE) 500 MG tablet   Need refill to cover till appointment in Feb of 2021  Has the patient contacted their pharmacy? Yes.   (Agent: If no, request that the patient contact the pharmacy for the refill.) (Agent: If yes, when and what did the pharmacy advise?)  Preferred Pharmacy (with phone number or street name): [facility] ([address] [place] - [place] [id number] (Phone) [phone] (Fax)    Agent: Please be advised that RX refills may take up to 3 business days. We ask that you follow-up with your pharmacy.

## 2019-05-12 ENCOUNTER — Other Ambulatory Visit: Payer: Self-pay | Admitting: Family Medicine

## 2019-05-12 ENCOUNTER — Encounter: Payer: Self-pay | Admitting: Adult Health

## 2019-05-12 MED ORDER — ONETOUCH VERIO VI STRP: ORAL_STRIP | 3 refills | Status: DC

## 2019-05-25 ENCOUNTER — Encounter: Payer: Self-pay | Admitting: Adult Health

## 2019-05-25 DIAGNOSIS — E1140 Type 2 diabetes mellitus with diabetic neuropathy, unspecified: Secondary | ICD-10-CM

## 2019-05-25 DIAGNOSIS — I10 Essential (primary) hypertension: Secondary | ICD-10-CM

## 2019-05-25 DIAGNOSIS — E139 Other specified diabetes mellitus without complications: Secondary | ICD-10-CM

## 2019-05-25 MED ORDER — AMITRIPTYLINE HCL 50 MG PO TABS: 50.0000 mg | ORAL_TABLET | Freq: Every day | ORAL | 1 refills | Status: DC

## 2019-05-25 MED ORDER — ATENOLOL 50 MG PO TABS: 50.0000 mg | ORAL_TABLET | Freq: Every day | ORAL | 1 refills | Status: DC

## 2019-05-25 MED ORDER — FENOFIBRATE 48 MG PO TABS: 48.0000 mg | ORAL_TABLET | Freq: Every day | ORAL | 1 refills | Status: DC

## 2019-05-27 ENCOUNTER — Telehealth: Payer: Self-pay

## 2019-05-27 NOTE — Telephone Encounter (Signed)
Myrle Sheng Key: Hilda Blades - PA Case ID: [id number] - Rx #: [id number] Need help? Call us at [phone] Status Sent to [place] strips Form [employer] Electronic PA Form

## 2019-05-28 ENCOUNTER — Telehealth: Payer: Self-pay | Admitting: *Deleted

## 2019-05-28 NOTE — Telephone Encounter (Signed)
Pt returned phone call but I was unavailable at the time. Tried to call pt back but no answer.

## 2019-05-28 NOTE — Telephone Encounter (Signed)
Erin Hatfield Key: Hilda Blades - PA Case ID: [id number] - Rx #: [id number] Need help? Call us at [phone] Outcome Deniedon January 13 The benefit provides coverage for the requested product when medically necessary. However, the information submitted doesnt meet [facility] medical necessity guidelines for coverage. The member must meet the following criteria: a non-preferred meter and test strips for self-monitoring of blood sugar (i.e. fingerstick testing) is said to be medically necessary for the member by a healthcare provider. [employer]&ap[employer]s preferred meters and test strips include: meters and test strips marketed by [employer] (e.g. Accu-Chek Aviva Plus, Accu-Chek Guide, Accu-Chek Guide Me) or [employer] (e.g. TrueMetrix, TrueTrack). This determination was based on the [employer] Diabetic Test Strips and Meters Coverage Policy. Drug OneTouch Verio strips Form [employer] Electronic PA Form   Will call pt with update and see if she would like to switch to one of the approved meters.

## 2019-05-28 NOTE — Telephone Encounter (Signed)
Patient spoke with Tillie Rung. Nothing further needed.  Copied from [place] [phone]. Topic: General - Call Back - No Documentation >> May 28, 2019  4:13 PM Erick Blinks wrote: Reason for CRM: Pt was on the phone with the office discussing her test strips, with possibly Tillie Rung. The call dropped and she is requesting to be called back >> May 28, 2019  4:15 PM Erick Blinks wrote: [phone]

## 2019-05-28 NOTE — Telephone Encounter (Signed)
Called pt to see if I could switch meter. However, pt phone disconnected. Tried to call pt back several times and alternative number still no answer.

## 2019-05-29 NOTE — Telephone Encounter (Signed)
This is being taking care of see Mychart message from 05/28/2019

## 2019-06-03 ENCOUNTER — Other Ambulatory Visit: Payer: Self-pay

## 2019-06-03 DIAGNOSIS — E139 Other specified diabetes mellitus without complications: Secondary | ICD-10-CM

## 2019-06-03 DIAGNOSIS — E1140 Type 2 diabetes mellitus with diabetic neuropathy, unspecified: Secondary | ICD-10-CM

## 2019-06-03 MED ORDER — ACCU-CHEK FASTCLIX LANCETS MISC: 1 refills | Status: DC

## 2019-06-03 MED ORDER — ACCU-CHEK GUIDE VI STRP: ORAL_STRIP | 12 refills | Status: DC

## 2019-06-03 MED ORDER — ACCU-CHEK GUIDE ME W/DEVICE KIT: 2.0000 | PACK | Freq: Two times a day (BID) | 0 refills | Status: DC | PRN

## 2019-06-03 MED ORDER — BD SWAB SINGLE USE REGULAR PADS: MEDICATED_PAD | 1 refills | Status: AC

## 2019-06-03 MED ORDER — ACCU-CHEK GUIDE CONTROL VI LIQD: 1.0000 | 1 refills | Status: DC | PRN

## 2019-06-03 NOTE — Progress Notes (Signed)
bd

## 2019-06-10 ENCOUNTER — Other Ambulatory Visit: Payer: Self-pay

## 2019-06-10 DIAGNOSIS — E139 Other specified diabetes mellitus without complications: Secondary | ICD-10-CM

## 2019-06-10 MED ORDER — ACCU-CHEK GUIDE ME W/DEVICE KIT: 2.0000 | PACK | Freq: Two times a day (BID) | 0 refills | Status: AC | PRN

## 2019-06-10 MED ORDER — ACCU-CHEK GUIDE CONTROL VI LIQD: 1.0000 | 1 refills | Status: DC | PRN

## 2019-06-10 MED ORDER — ACCU-CHEK GUIDE VI STRP: ORAL_STRIP | 12 refills | Status: DC

## 2019-06-10 MED ORDER — ACCU-CHEK FASTCLIX LANCETS MISC: 1 refills | Status: DC

## 2019-06-26 ENCOUNTER — Ambulatory Visit (INDEPENDENT_AMBULATORY_CARE_PROVIDER_SITE_OTHER): Payer: Medicare PPO | Admitting: Adult Health

## 2019-06-26 ENCOUNTER — Telehealth: Payer: Self-pay | Admitting: Adult Health

## 2019-06-26 ENCOUNTER — Encounter: Payer: Self-pay | Admitting: Adult Health

## 2019-06-26 ENCOUNTER — Other Ambulatory Visit: Payer: Self-pay

## 2019-06-26 DIAGNOSIS — E782 Mixed hyperlipidemia: Secondary | ICD-10-CM | POA: Diagnosis not present

## 2019-06-26 DIAGNOSIS — E559 Vitamin D deficiency, unspecified: Secondary | ICD-10-CM

## 2019-06-26 DIAGNOSIS — E1140 Type 2 diabetes mellitus with diabetic neuropathy, unspecified: Secondary | ICD-10-CM

## 2019-06-26 DIAGNOSIS — E139 Other specified diabetes mellitus without complications: Secondary | ICD-10-CM

## 2019-06-26 DIAGNOSIS — I10 Essential (primary) hypertension: Secondary | ICD-10-CM | POA: Diagnosis not present

## 2019-06-26 DIAGNOSIS — E1340 Other specified diabetes mellitus with diabetic neuropathy, unspecified: Secondary | ICD-10-CM

## 2019-06-26 DIAGNOSIS — Z0000 Encounter for general adult medical examination without abnormal findings: Secondary | ICD-10-CM | POA: Diagnosis not present

## 2019-06-26 LAB — COMPREHENSIVE METABOLIC PANEL
ALT: 16 U/L (ref 0–35)
AST: 15 U/L (ref 0–37)
Albumin: 4.2 g/dL (ref 3.5–5.2)
Alkaline Phosphatase: 67 U/L (ref 39–117)
BUN: 13 mg/dL (ref 6–23)
CO2: 28 mEq/L (ref 19–32)
Calcium: 10 mg/dL (ref 8.4–10.5)
Chloride: 104 mEq/L (ref 96–112)
Creatinine, Ser: 0.9 mg/dL (ref 0.40–1.20)
GFR: 74.79 mL/min (ref >60.00)
Glucose, Bld: 153 mg/dL — ABNORMAL HIGH (ref 70–99)
Potassium: 4.8 mEq/L (ref 3.5–5.1)
Sodium: 141 mEq/L (ref 135–145)
Total Bilirubin: 0.5 mg/dL (ref 0.2–1.2)
Total Protein: 6.8 g/dL (ref 6.0–8.3)

## 2019-06-26 LAB — LIPID PANEL
Cholesterol: 158 mg/dL (ref 0–200)
HDL: 40.5 mg/dL (ref >39.00)
NonHDL: 117.04
Total CHOL/HDL Ratio: 4
Triglycerides: 215 mg/dL — ABNORMAL HIGH (ref 0.0–149.0)
VLDL: 43 mg/dL — ABNORMAL HIGH (ref 0.0–40.0)

## 2019-06-26 LAB — CBC WITH DIFFERENTIAL/PLATELET
Basophils Absolute: 0 10*3/uL (ref 0.0–0.1)
Basophils Relative: 0.5 % (ref 0.0–3.0)
Eosinophils Absolute: 0.1 10*3/uL (ref 0.0–0.7)
Eosinophils Relative: 2 % (ref 0.0–5.0)
HCT: 35.7 % — ABNORMAL LOW (ref 36.0–46.0)
Hemoglobin: 11.6 g/dL — ABNORMAL LOW (ref 12.0–15.0)
Lymphocytes Relative: 49 % — ABNORMAL HIGH (ref 12.0–46.0)
Lymphs Abs: 3.5 10*3/uL (ref 0.7–4.0)
MCHC: 32.5 g/dL (ref 30.0–36.0)
MCV: 84.4 fl (ref 78.0–100.0)
Monocytes Absolute: 0.6 10*3/uL (ref 0.1–1.0)
Monocytes Relative: 8.2 % (ref 3.0–12.0)
Neutro Abs: 2.8 10*3/uL (ref 1.4–7.7)
Neutrophils Relative %: 40.3 % — ABNORMAL LOW (ref 43.0–77.0)
Platelets: 389 10*3/uL (ref 150.0–400.0)
RBC: 4.23 Mil/uL (ref 3.87–5.11)
RDW: 12.2 % (ref 11.5–15.5)
WBC: 7.1 10*3/uL (ref 4.0–10.5)

## 2019-06-26 LAB — LDL CHOLESTEROL, DIRECT: Direct LDL: 81 mg/dL

## 2019-06-26 LAB — HEMOGLOBIN A1C: Hgb A1c MFr Bld: 7.4 % — ABNORMAL HIGH (ref 4.6–6.5)

## 2019-06-26 LAB — VITAMIN D 25 HYDROXY (VIT D DEFICIENCY, FRACTURES): VITD: 56.06 ng/mL (ref 30.00–100.00)

## 2019-06-26 LAB — TSH: TSH: 3.17 u[IU]/mL (ref 0.35–4.50)

## 2019-06-26 MED ORDER — LISINOPRIL 10 MG PO TABS: 10.0000 mg | ORAL_TABLET | Freq: Every day | ORAL | 3 refills | Status: DC

## 2019-06-26 MED ORDER — METFORMIN HCL 500 MG PO TABS: ORAL_TABLET | ORAL | 0 refills | Status: DC

## 2019-06-26 MED ORDER — FENOFIBRATE 145 MG PO TABS: 145.0000 mg | ORAL_TABLET | Freq: Every day | ORAL | 3 refills | Status: DC

## 2019-06-26 MED ORDER — SIMVASTATIN 5 MG PO TABS: 5.0000 mg | ORAL_TABLET | Freq: Every day | ORAL | 3 refills | Status: DC

## 2019-06-26 NOTE — Telephone Encounter (Signed)
Updated patient on her labs.   Triglycerides have improved and so has low cholesterol and LDL.  Will increase fenofibrate to 145 mg.  Her A1c has increased from 7.0-7.4.  She is going to work on lifestyle modifications and follow-up in 3 months

## 2019-06-26 NOTE — Progress Notes (Signed)
Subjective:    Patient ID: Erin Hatfield, female    DOB: January 11, 1951, 69 y.o.   MRN: [id number]  HPI  Patient presents for yearly preventative medicine examination. She is a pleasant 69 year old female who  has a past medical history of Allergy, Bunion, left foot, Complication of anesthesia, Diabetes mellitus without complication ([place]), Family history of breast cancer, Family history of ovarian cancer, Hot flashes, Hypertension, and PONV (postoperative nausea and vomiting).  DM -She is maintained on metformin 500 mg in the morning and 1000 mg in the evening.  She denies episodes of hypoglycemia.  He does monitor her blood sugars at home and reports readings between in 120-160.  Lab Results  Component Value Date   HGBA1C 7.0 (H) 01/12/2019   Diabetic neuropathy-prescribed Elavil 50 mg nightly, feels controlled with this medication  Hyperlipidemia-currently prescribed simvastatin 5 mg and fenofibrate 48 mg.  Lab Results  Component Value Date   CHOL 214 (H) 06/05/2018   HDL 43.10 06/05/2018   LDLDIRECT 102.0 06/05/2018   TRIG (H) 06/05/2018    414.0 Triglyceride is over 400; calculations on Lipids are invalid.   CHOLHDL 5 06/05/2018    Essential hypertension-she takes atenolol 50 mg daily and lisinopril 10 mg daily.She does monitor her blood pressure at home and reports readings in the 120-130's/80 BP Readings from Last 3 Encounters:  11/21/18 (!) 144/82  10/30/18 120/70  10/13/18 (!) 142/80   Family History of Ovarian Cancer -06-27-2017 her sister passed away from ovarian cancer.  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  All immunizations and health maintenance protocols were reviewed with the patient and needed orders were placed. She is up to date on vaccinations   Appropriate screening laboratory values were ordered for the patient including screening of hyperlipidemia, renal  function and hepatic function. Medication reconciliation,  past medical history, social history, problem list and allergies were reviewed in detail with the patient  Goals were established with regard to weight loss, exercise, and  diet in compliance with medications. She is back to walking and is eating healthy  Wt Readings from Last 3 Encounters:  11/21/18 170 lb 4 oz (77.2 kg)  10/30/18 171 lb (77.6 kg)  10/13/18 169 lb 1 oz (76.7 kg)   She has no acute complaints.   Review of Systems  Constitutional: Negative.   HENT: Negative.   Eyes: Negative.   Respiratory: Negative.   Cardiovascular: Negative.   Gastrointestinal: Negative.   Endocrine: Negative.   Genitourinary: Negative.   Musculoskeletal: Negative.   Skin: Negative.   Allergic/Immunologic: Negative.   Neurological: Negative.   Hematological: Negative.   Psychiatric/Behavioral: Negative.    Past Medical History:  Diagnosis Date  . Allergy   . Bunion, left foot   . Complication of anesthesia   . Diabetes mellitus without complication ([place])    type 2  . Family history of breast cancer   . Family history of ovarian cancer   . Hot flashes   . Hypertension   . PONV (postoperative nausea and vomiting)     Social History   Socioeconomic History  . Marital status: Married    Spouse name: Not on file  . Number of children: Not on file  . Years of education: Not on file  . Highest education level: Not on file  Occupational History  . Not on file  Tobacco Use  . Smoking status: Never  Smoker  . Smokeless tobacco: Never Used  Substance and Sexual Activity  . Alcohol use: No  . Drug use: No  . Sexual activity: Not Currently  Other Topics Concern  . 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 Difficulty of Paying Living Expenses: Not on file  Food Insecurity:   . Worried About Charity fundraiser in the Last Year: Not on file  . Ran Out of Food in the Last Year: Not on file  Transportation Needs:   . Lack of Transportation (Medical): Not on file  . Lack of Transportation (Non-Medical): Not on file  Physical Activity:   . Days of Exercise per Week: Not on file  . Minutes of Exercise per Session: Not on file  Stress:   . Feeling of Stress : Not on file  Social Connections:   . Frequency of Communication with Friends and Family: Not on file  . Frequency of Social Gatherings with Friends and Family: Not on file  . Attends Religious Services: Not on file  . Active Member of Clubs or Organizations: Not on file  . Attends Archivist Meetings: Not on file  . Marital Status: Not on file  Intimate Partner Violence:   . Fear of Current or Ex-Partner: Not on file  . Emotionally Abused: Not on file  . Physically Abused: Not on file  . Sexually Abused: Not on file    Past Surgical History:  Procedure Laterality Date  . LAPAROSCOPIC BILATERAL SALPINGO OOPHERECTOMY Bilateral 10/13/2018   Procedure: LAPAROSCOPIC BILATERAL SALPINGO OOPHORECTOMY;  Surgeon: Megan Salon, MD;  Location: [facility];  Service: Gynecology;  Laterality: Bilateral;  . VAGINAL HYSTERECTOMY  2002   fibroids partial    Family History  Problem Relation Age of Onset  . Diabetes Sister   . Ovarian cancer Sister 58  . Breast cancer Mother 52  . Lung cancer Father        Smoker  . Diabetes Other   . Hypertension Other   . Diabetes Sister   . Breast cancer Niece 70  . Colon cancer Neg Hx     No Known Allergies  Current Outpatient Medications on File Prior to Visit  Medication Sig Dispense Refill  . Accu-Chek FastClix Lancets MISC Used to check blood sugar 2 times daily or PRN 102 each 1  . Alcohol Swabs (B-D SINGLE USE SWABS REGULAR) PADS Use to clean finger before  checking blood sugar 2 times daily 100 each 1  . amitriptyline (ELAVIL) 50 MG tablet Take 1 tablet (50 mg total) by mouth at bedtime. 90 tablet 1  . aspirin 81 MG tablet Take 81 mg by mouth daily.      Marland Kitchen atenolol (TENORMIN) 50 MG tablet Take 1 tablet (50 mg total) by mouth daily. 90 tablet 1  . Blood Glucose Calibration (ACCU-CHEK GUIDE CONTROL) LIQD 1 Bottle by In Vitro route as needed. 1 each 1  . Blood Glucose Monitoring Suppl (ACCU-CHEK GUIDE ME) w/Device KIT 2 Sticks by Does not apply route 3 times/day as needed-between meals & bedtime. Used to check blood sugar 2 times daily 1 kit 0  . cetirizine (ZYRTEC) 10 MG tablet Take 10 mg by mouth as needed.     Marland Kitchen  cholecalciferol (VITAMIN D) 1000 UNITS tablet Take 1,000 Units by mouth daily.      . Chromium-Cinnamon (CINNAMON PLUS CHROMIUM) 100-500 MCG-MG CAPS Take by mouth 2 (two) times daily.    . fenofibrate (TRICOR) 48 MG tablet Take 1 tablet (48 mg total) by mouth daily. 90 tablet 1  . fish oil-omega-3 fatty acids 1000 MG capsule Take 1 g by mouth daily.      Marland Kitchen glucose blood (ACCU-CHEK GUIDE) test strip Used to check blood sugar 2 times daily 100 each 12  . ibuprofen (ADVIL) 800 MG tablet Take 1 tablet (800 mg total) by mouth every 8 (eight) hours as needed. 30 tablet 0  . lisinopril (PRINIVIL,ZESTRIL) 10 MG tablet TAKE 1 TABLET BY MOUTH ONCE DAILY 90 tablet 3  . metFORMIN (GLUCOPHAGE) 500 MG tablet TAKE 1 TABLET BY MOUTH IN THE MORNING AND 2 IN THE EVENING 270 tablet 0  . Multiple Vitamin (MULTIVITAMIN) tablet Take 1 tablet by mouth daily.    . simvastatin (ZOCOR) 5 MG tablet Take 1 tablet (5 mg total) by mouth daily. 90 tablet 3   No current facility-administered medications on file prior to visit.    LMP 05/14/2000 (Approximate)       Objective:   Physical Exam Vitals and nursing note reviewed.  Constitutional:      General: She is not in acute distress.    Appearance: She is not diaphoretic.  HENT:     Head: Normocephalic and  atraumatic.     Right Ear: Tympanic membrane, ear canal and external ear normal. There is no impacted cerumen.     Left Ear: Tympanic membrane, ear canal and external ear normal. There is no impacted cerumen.     Nose: Nose normal. No congestion or rhinorrhea.     Mouth/Throat:     Mouth: Mucous membranes are moist.     Pharynx: Oropharynx is clear. No oropharyngeal exudate.  Eyes:     General: No scleral icterus.       Right eye: No discharge.        Left eye: No discharge.     Conjunctiva/sclera: Conjunctivae normal.     Pupils: Pupils are equal, round, and reactive to light.  Neck:     Thyroid: No thyromegaly.     Vascular: No JVD.     Trachea: No tracheal deviation.  Cardiovascular:     Rate and Rhythm: Normal rate and regular rhythm.     Pulses: Normal pulses.     Heart sounds: Normal heart sounds. No murmur. No friction rub. No gallop.   Pulmonary:     Effort: Pulmonary effort is normal. No respiratory distress.     Breath sounds: Normal breath sounds. No stridor. No wheezing, rhonchi or rales.  Chest:     Chest wall: No tenderness.  Abdominal:     General: Bowel sounds are normal. There is no distension.     Palpations: Abdomen is soft. There is no mass.     Tenderness: There is no abdominal tenderness. There is no left CVA tenderness, guarding or rebound.     Hernia: No hernia is present.  Musculoskeletal:        General: No swelling, tenderness, deformity or signs of injury. Normal range of motion.     Cervical back: Normal range of motion and neck supple.     Right lower leg: No edema.     Left lower leg: No edema.  Lymphadenopathy:     Cervical: No cervical adenopathy.  Skin:    General: Skin is warm and dry.     Coloration: Skin is not jaundiced or pale.     Findings: No bruising, erythema, lesion or rash.  Neurological:     General: No focal deficit present.     Mental Status: She is alert and oriented to person, place, and time. Mental status is at baseline.      Cranial Nerves: No cranial nerve deficit.     Sensory: No sensory deficit.     Motor: No weakness or abnormal muscle tone.     Coordination: Coordination normal.     Deep Tendon Reflexes: Reflexes normal.  Psychiatric:        Mood and Affect: Mood normal.        Behavior: Behavior normal.        Thought Content: Thought content normal.        Judgment: Judgment normal.       Assessment & Plan:  1. Routine general medical examination at a health care facility - Benign exam  - Follow up in one year or sooner if needed - Continue to exercise and eat healthy  - CBC with Differential/Platelet - Comprehensive metabolic panel - Hemoglobin A1c - Lipid panel - TSH  2. Essential hypertension - BP better controlled at home  - No medication changes  - CBC with Differential/Platelet - Comprehensive metabolic panel - Hemoglobin A1c - Lipid panel - TSH  3. Diabetic neuropathy, painful ([facility]) - Continue Elavil  - CBC with Differential/Platelet - Comprehensive metabolic panel - Hemoglobin A1c - Lipid panel - TSH  4. Diabetes 1.5, managed as type 2 ([place]) - Consider increase in metformin  - Follow up in 3 months  - CBC with Differential/Platelet - Comprehensive metabolic panel - Hemoglobin A1c - Lipid panel - TSH  5. Mixed hyperlipidemia - Consider increase in statin/fenofibrate - CBC with Differential/Platelet - Comprehensive metabolic panel - Hemoglobin A1c - Lipid panel - TSH  6. Vitamin D deficiency  - Vitamin D, 25-hydroxy  [name], NP

## 2019-07-27 ENCOUNTER — Telehealth: Payer: Self-pay | Admitting: Adult Health

## 2019-07-27 NOTE — Telephone Encounter (Signed)
Pt call stated Erin Hatfield want her to come back for labs in three mo ,please put order in I made the appt .

## 2019-07-28 NOTE — Telephone Encounter (Signed)
Pt needs visit with Tommi Rumps and a1c at the visit.  Not a lab appointment.

## 2019-07-28 NOTE — Telephone Encounter (Signed)
Left a message for a return call.

## 2019-07-29 NOTE — Telephone Encounter (Signed)
Left a message for a return call.

## 2019-07-29 NOTE — Telephone Encounter (Signed)
Noted  

## 2019-07-29 NOTE — Telephone Encounter (Signed)
Pt has scheduled a 3 month follow up for May.

## 2019-09-10 ENCOUNTER — Other Ambulatory Visit: Payer: Self-pay | Admitting: Adult Health

## 2019-09-10 DIAGNOSIS — Z1231 Encounter for screening mammogram for malignant neoplasm of breast: Secondary | ICD-10-CM

## 2019-09-29 ENCOUNTER — Other Ambulatory Visit: Payer: Self-pay

## 2019-09-30 ENCOUNTER — Encounter: Payer: Self-pay | Admitting: Adult Health

## 2019-09-30 ENCOUNTER — Ambulatory Visit: Payer: Medicare PPO | Admitting: Adult Health

## 2019-09-30 VITALS — BP 140/88 | Temp 98.0°F | Wt 170.0 lb

## 2019-09-30 DIAGNOSIS — E139 Other specified diabetes mellitus without complications: Secondary | ICD-10-CM | POA: Diagnosis not present

## 2019-09-30 DIAGNOSIS — I10 Essential (primary) hypertension: Secondary | ICD-10-CM | POA: Diagnosis not present

## 2019-09-30 LAB — POCT GLYCOSYLATED HEMOGLOBIN (HGB A1C): HbA1c, POC (controlled diabetic range): 6.9 % (ref 0.0–7.0)

## 2019-09-30 MED ORDER — METFORMIN HCL 500 MG PO TABS: ORAL_TABLET | ORAL | 0 refills | Status: DC

## 2019-09-30 MED ORDER — ATENOLOL 50 MG PO TABS: 50.0000 mg | ORAL_TABLET | Freq: Every day | ORAL | 3 refills | Status: DC

## 2019-09-30 NOTE — Progress Notes (Signed)
Subjective:    Patient ID: Erin Hatfield, female    DOB: [birth year], 69 y.o.   MRN: [id number]  HPI 69 year old female who  has a past medical history of Allergy, Bunion, left foot, Complication of anesthesia, Diabetes mellitus without complication ([place]), Family history of breast cancer, Family history of ovarian cancer, Hot flashes, Hypertension, and PONV (postoperative nausea and vomiting).  She presents to the office today for follow-up regarding diabetes mellitus.  She is currently maintained on metformin 500 mg in the morning and 1000 mg in the evening.  Her last A1c in February 2021 increased from 7.0-7.4.  She has been working on her diet but she has not been able to exercise as much as she would like because she walks at the mall and since they are vaccinating people there she has not been going.  She also reports that she is under a lot of stress, her husband was diagnosed with prostate cancer, fully the prognosis is good but he is having to go to radiation every day for 5 weeks and this is wearing on her.  He has noticed that her blood pressure has been going up some at home since she has been so stressed out in the past she would get readings consistently at 120-130 over 80s.  Now she is seeing readings of 1 35-1 40 over 70s to 80s.    Review of Systems See HPI   Past Medical History:  Diagnosis Date  . Allergy   . Bunion, left foot   . Complication of anesthesia   . Diabetes mellitus without complication ([place])    type 2  . Family history of breast cancer   . Family history of ovarian cancer   . Hot flashes   . Hypertension   . PONV (postoperative nausea and vomiting)     Social History   Socioeconomic History  . Marital status: Married    Spouse name: Not on file  . Number of children: Not on file  . Years of education: Not on file  . Highest education level: Not on file  Occupational History  . Not on file  Tobacco Use  . Smoking status: Never Smoker  .  Smokeless tobacco: Never Used  Substance and Sexual Activity  . Alcohol use: No  . Drug use: No  . Sexual activity: Not Currently  Other Topics Concern  . 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 Difficulty of Paying Living Expenses:   Food Insecurity:   . Worried About Charity fundraiser in the Last Year:   . Arboriculturist in the Last Year:   Transportation Needs:   . Film/video editor (Medical):   Marland Kitchen Lack of Transportation (Non-Medical):   Physical Activity:   . Days of Exercise per Week:   . Minutes of Exercise per Session:   Stress:   . Feeling of Stress :   Social Connections:   . Frequency of Communication with Friends and Family:   . Frequency of Social Gatherings with Friends and Family:   . Attends Religious Services:   . Active Member of Clubs or Organizations:   . Attends Archivist Meetings:   Marland Kitchen Marital Status:  Intimate Partner Violence:   . Fear of Current or Ex-Partner:   . Emotionally Abused:   Marland Kitchen Physically Abused:   . Sexually Abused:     Past Surgical History:  Procedure Laterality Date  . LAPAROSCOPIC BILATERAL SALPINGO OOPHERECTOMY Bilateral 10/13/2018   Procedure: LAPAROSCOPIC BILATERAL SALPINGO OOPHORECTOMY;  Surgeon: Megan Salon, MD;  Location[facility];  Service: Gynecology;  Laterality: Bilateral;  . VAGINAL HYSTERECTOMY  2002   fibroids partial    Family History  Problem Relation Age of Onset  . Diabetes Sister   . Ovarian cancer Sister 72  . Breast cancer Mother 68  . Lung cancer Father        Smoker  . Diabetes Other   . Hypertension Other   . Diabetes Sister   . Breast cancer Niece 39  . Colon cancer Neg Hx     No Known Allergies  Current Outpatient Medications  on File Prior to Visit  Medication Sig Dispense Refill  . Accu-Chek Softclix Lancets lancets 1 each by Other route. Use once daily Dx E11.9    . Alcohol Swabs (B-D SINGLE USE SWABS REGULAR) PADS Use to clean finger before checking blood sugar 2 times daily 100 each 1  . amitriptyline (ELAVIL) 50 MG tablet Take 1 tablet (50 mg total) by mouth at bedtime. 90 tablet 1  . aspirin 81 MG tablet Take 81 mg by mouth daily.      . Blood Glucose Calibration (ACCU-CHEK GUIDE CONTROL) LIQD 1 Bottle by In Vitro route as needed. 1 each 1  . Blood Glucose Monitoring Suppl (ACCU-CHEK GUIDE ME) w/Device KIT 2 Sticks by Does not apply route 3 times/day as needed-between meals & bedtime. Used to check blood sugar 2 times daily 1 kit 0  . cetirizine (ZYRTEC) 10 MG tablet Take 10 mg by mouth as needed.     . cholecalciferol (VITAMIN D) 1000 UNITS tablet Take 1,000 Units by mouth daily.      . Chromium-Cinnamon (CINNAMON PLUS CHROMIUM) 100-500 MCG-MG CAPS Take by mouth 2 (two) times daily.    . fenofibrate (TRICOR) 145 MG tablet Take 1 tablet (145 mg total) by mouth daily. 90 tablet 3  . fish oil-omega-3 fatty acids 1000 MG capsule Take 1 g by mouth daily.      Marland Kitchen glucose blood (ACCU-CHEK GUIDE) test strip Used to check blood sugar 2 times daily 100 each 12  . ibuprofen (ADVIL) 800 MG tablet Take 1 tablet (800 mg total) by mouth every 8 (eight) hours as needed. 30 tablet 0  . lisinopril (ZESTRIL) 10 MG tablet Take 1 tablet (10 mg total) by mouth daily. 90 tablet 3  . Multiple Vitamin (MULTIVITAMIN) tablet Take 1 tablet by mouth daily.    . simvastatin (ZOCOR) 5 MG tablet Take 1 tablet (5 mg total) by mouth daily. 90 tablet 3   No current facility-administered medications on file prior to visit.    BP 140/88   Temp 98 F (36.7 C)   Wt 170 lb (77.1 kg)   LMP 05/14/2000 (Approximate)   BMI 32.12 kg/m       Objective:   Physical Exam Vitals and nursing note reviewed.  Constitutional:      Appearance: Normal  appearance.  Cardiovascular:     Rate and Rhythm: Normal rate and regular rhythm.     Pulses: Normal pulses.     Heart sounds: Normal heart sounds.  Pulmonary:     Effort: Pulmonary effort is normal.  Breath sounds: Normal breath sounds.  Neurological:     General: No focal deficit present.     Mental Status: She is alert and oriented to person, place, and time.  Psychiatric:        Mood and Affect: Mood normal.        Behavior: Behavior normal.        Thought Content: Thought content normal.        Judgment: Judgment normal.       Assessment & Plan:  1. Diabetes 1.5, managed as type 2 ([place])  - POCT A1C- 6.9 -she has improved and is now at goal.  She was encouraged to continue to work on diet and start walking again. - metFORMIN (GLUCOPHAGE) 500 MG tablet; TAKE 1 TABLET BY MOUTH IN THE MORNING AND 2 IN THE EVENING  Dispense: 270 tablet; Refill: 0  2. Essential hypertension -BP is up a little bit today past her baseline.  She will continue to monitor at home and report back if blood pressures do not go down after her stress has decreased a little bit.  Red flags reviewed and she knows to contact me if her blood pressure is consistently above 174 systolic - atenolol (TENORMIN) 50 MG tablet; Take 1 tablet (50 mg total) by mouth daily.  Dispense: 90 tablet; Refill: 3  [name], NP

## 2019-10-15 ENCOUNTER — Other Ambulatory Visit: Payer: Medicare PPO

## 2019-10-15 ENCOUNTER — Ambulatory Visit: Payer: Medicare PPO | Admitting: Adult Health

## 2019-11-02 ENCOUNTER — Other Ambulatory Visit: Payer: Self-pay

## 2019-11-02 ENCOUNTER — Ambulatory Visit
Admission: RE | Admit: 2019-11-02 | Discharge: 2019-11-02 | Disposition: A | Discharge status: Home / Self Care | Payer: Medicare PPO | Source: Ambulatory Visit | Attending: Adult Health | Admitting: Adult Health

## 2019-11-02 DIAGNOSIS — Z1231 Encounter for screening mammogram for malignant neoplasm of breast: Secondary | ICD-10-CM

## 2019-11-13 ENCOUNTER — Ambulatory Visit: Payer: Medicare PPO | Admitting: Adult Health

## 2019-11-13 ENCOUNTER — Other Ambulatory Visit: Payer: Self-pay

## 2019-11-13 ENCOUNTER — Encounter: Payer: Self-pay | Admitting: Adult Health

## 2019-11-13 VITALS — BP 150/88 | Temp 97.9°F | Wt 168.0 lb

## 2019-11-13 DIAGNOSIS — M5432 Sciatica, left side: Secondary | ICD-10-CM

## 2019-11-13 DIAGNOSIS — I10 Essential (primary) hypertension: Secondary | ICD-10-CM | POA: Diagnosis not present

## 2019-11-13 MED ORDER — METHYLPREDNISOLONE 4 MG PO TBPK: ORAL_TABLET | ORAL | 0 refills | Status: DC

## 2019-11-13 MED ORDER — CYCLOBENZAPRINE HCL 5 MG PO TABS: 5.0000 mg | ORAL_TABLET | Freq: Three times a day (TID) | ORAL | 0 refills | Status: DC | PRN

## 2019-11-13 NOTE — Progress Notes (Signed)
Subjective:    Patient ID: Erin Hatfield, female    DOB: [birth year], 69 y.o.   MRN: [id number]  HPI 69 year old female who  has a past medical history of Allergy, Bunion, left foot, Complication of anesthesia, Diabetes mellitus without complication ([place]), Family history of breast cancer, Family history of ovarian cancer, Hot flashes, Hypertension, and PONV (postoperative nausea and vomiting).  She presents to the office today for an acute issue of pain in her left thigh.  Pain has been intermittent over the last few months but seems to be getting a little bit worse.  Pain is described as a burning pain and is located between the knee and hip.  Pain is always gated on the outside of her left leg.  Her pain is worse with change of positions such as going from a sitting position to a standing.  No pain with sitting or walking.  He denies any trauma or aggravating injury.  At times the left outer leg is sensitive to touch.  Denies redness, warmth, or bruising.  She has not had any aggravating injuries or trauma  At home she has been treating with Tylenol and heating pad in which Tylenol seems to help moderately.  Additionally she has been monitoring her blood pressure since we last saw her in May.  Her blood pressures at home have been roughly 140s to 150s over 70s to 80s.  She is on atenolol 50 mg daily   Review of Systems See HPI   Past Medical History:  Diagnosis Date  . Allergy   . Bunion, left foot   . Complication of anesthesia   . Diabetes mellitus without complication ([place])    type 2  . Family history of breast cancer   . Family history of ovarian cancer   . Hot flashes   . Hypertension   . PONV (postoperative nausea and vomiting)     Social History   Socioeconomic History  . Marital status: Married    Spouse name: Not on file  . Number of children: Not on file  . Years of education: Not on file  . Highest education level: Not on file  Occupational History  . Not  on file  Tobacco Use  . Smoking status: Never Smoker  . Smokeless tobacco: Never Used  Vaping Use  . Vaping Use: Never used  Substance and Sexual Activity  . Alcohol use: No  . Drug use: No  . Sexual activity: Not Currently  Other Topics Concern  . 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 Difficulty of Paying Living Expenses:   Food Insecurity:   . Worried About Charity fundraiser in the Last Year:   . Arboriculturist in the Last Year:   Transportation Needs:   . Film/video editor (Medical):   Marland Kitchen Lack of Transportation (Non-Medical):   Physical Activity:   . Days of Exercise per Week:   . Minutes of Exercise per Session:   Stress:   . Feeling of Stress :   Social Connections:   . Frequency of Communication with Friends and Family:   . Frequency of Social Gatherings with Friends and Family:   . Attends Religious Services:   .  Active Member of Clubs or Organizations:   . Attends Archivist Meetings:   Marland Kitchen Marital Status:   Intimate Partner Violence:   . Fear of Current or Ex-Partner:   . Emotionally Abused:   Marland Kitchen Physically Abused:   . Sexually Abused:     Past Surgical History:  Procedure Laterality Date  . LAPAROSCOPIC BILATERAL SALPINGO OOPHERECTOMY Bilateral 10/13/2018   Procedure: LAPAROSCOPIC BILATERAL SALPINGO OOPHORECTOMY;  Surgeon: Megan Salon, MD;  Location: [facility];  Service: Gynecology;  Laterality: Bilateral;  . VAGINAL HYSTERECTOMY  2002   fibroids partial    Family History  Problem Relation Age of Onset  . Diabetes Sister   . Ovarian cancer Sister 48  . Breast cancer Mother 38  . Lung cancer Father        Smoker  . Diabetes Other   . Hypertension Other   . Diabetes Sister   . Breast  cancer Niece 68  . Colon cancer Neg Hx     No Known Allergies  Current Outpatient Medications on File Prior to Visit  Medication Sig Dispense Refill  . Accu-Chek Softclix Lancets lancets 1 each by Other route. Use once daily Dx E11.9    . Alcohol Swabs (B-D SINGLE USE SWABS REGULAR) PADS Use to clean finger before checking blood sugar 2 times daily 100 each 1  . amitriptyline (ELAVIL) 50 MG tablet Take 1 tablet (50 mg total) by mouth at bedtime. 90 tablet 1  . aspirin 81 MG tablet Take 81 mg by mouth daily.      Marland Kitchen atenolol (TENORMIN) 50 MG tablet Take 1 tablet (50 mg total) by mouth daily. 90 tablet 3  . Blood Glucose Calibration (ACCU-CHEK GUIDE CONTROL) LIQD 1 Bottle by In Vitro route as needed. 1 each 1  . Blood Glucose Monitoring Suppl (ACCU-CHEK GUIDE ME) w/Device KIT 2 Sticks by Does not apply route 3 times/day as needed-between meals & bedtime. Used to check blood sugar 2 times daily 1 kit 0  . cetirizine (ZYRTEC) 10 MG tablet Take 10 mg by mouth as needed.     . cholecalciferol (VITAMIN D) 1000 UNITS tablet Take 1,000 Units by mouth daily.      . Chromium-Cinnamon (CINNAMON PLUS CHROMIUM) 100-500 MCG-MG CAPS Take by mouth 2 (two) times daily.    . fenofibrate (TRICOR) 145 MG tablet Take 1 tablet (145 mg total) by mouth daily. 90 tablet 3  . fish oil-omega-3 fatty acids 1000 MG capsule Take 1 g by mouth daily.      Marland Kitchen glucose blood (ACCU-CHEK GUIDE) test strip Used to check blood sugar 2 times daily 100 each 12  . ibuprofen (ADVIL) 800 MG tablet Take 1 tablet (800 mg total) by mouth every 8 (eight) hours as needed. 30 tablet 0  . lisinopril (ZESTRIL) 10 MG tablet Take 1 tablet (10 mg total) by mouth daily. 90 tablet 3  . metFORMIN (GLUCOPHAGE) 500 MG tablet TAKE 1 TABLET BY MOUTH IN THE MORNING AND 2 IN THE EVENING 270 tablet 0  . Multiple Vitamin (MULTIVITAMIN) tablet Take 1 tablet by mouth daily.    . simvastatin (ZOCOR) 5 MG tablet Take 1 tablet (5 mg total) by mouth daily. 90  tablet 3   No current facility-administered medications on file prior to visit.    BP (!) 150/88   Temp 97.9 F (36.6 C)   Wt 168 lb (76.2 kg)   LMP 05/14/2000 (Approximate)   BMI 31.74 kg/m  Objective:   Physical Exam Vitals and nursing note reviewed.  Constitutional:      Appearance: Normal appearance.  Musculoskeletal:        General: Tenderness (left lateral thigh) present. No swelling or deformity. Normal range of motion.     Right lower leg: No edema.     Left lower leg: No edema.  Skin:    General: Skin is warm and dry.  Neurological:     General: No focal deficit present.     Mental Status: She is alert and oriented to person, place, and time.  Psychiatric:        Mood and Affect: Mood normal.        Behavior: Behavior normal.        Thought Content: Thought content normal.        Judgment: Judgment normal.       Assessment & Plan:  1. Sciatica of left side -Exam consistent with sciatica. -We will prescribe Medrol Dosepak and Flexeril that she can take at night.  She was advised that this medication can make her sleepy.  Stretching exercises are also recommended.  Follow-up if no improvement over the weekend - cyclobenzaprine (FLEXERIL) 5 MG tablet; Take 1 tablet (5 mg total) by mouth 3 (three) times daily as needed for muscle spasms.  Dispense: 15 tablet; Refill: 0 - methylPREDNISolone (MEDROL DOSEPAK) 4 MG TBPK tablet; Take as directed  Dispense: 21 tablet; Refill: 0  2. Essential hypertension - Not at goal for patient.  - Will have her increase Atenolol to 75 mg and follow up with me via mychart in two weeks with BP readings.

## 2019-11-13 NOTE — Patient Instructions (Signed)
I am going to increase your Atenolol to 75 mg     Your exam is consistent with Sciatica.  I am going to prescribe you a muscle relaxer and prednisone.   Let me know if you are not improved over the next week

## 2019-11-18 ENCOUNTER — Encounter: Payer: Self-pay | Admitting: Adult Health

## 2019-12-03 ENCOUNTER — Other Ambulatory Visit: Payer: Self-pay | Admitting: Adult Health

## 2019-12-03 DIAGNOSIS — E139 Other specified diabetes mellitus without complications: Secondary | ICD-10-CM

## 2019-12-03 DIAGNOSIS — E1140 Type 2 diabetes mellitus with diabetic neuropathy, unspecified: Secondary | ICD-10-CM

## 2019-12-04 NOTE — Telephone Encounter (Signed)
Sent to the pharmacy by e-scribe. 

## 2019-12-11 ENCOUNTER — Telehealth: Payer: Self-pay | Admitting: Adult Health

## 2019-12-11 NOTE — Telephone Encounter (Signed)
Left message for patient to schedule Annual Wellness Visit.  Please schedule with Nurse Health Advisor Shannon Crews, RN at [facility]  

## 2019-12-22 ENCOUNTER — Other Ambulatory Visit: Payer: Self-pay

## 2019-12-22 ENCOUNTER — Ambulatory Visit (INDEPENDENT_AMBULATORY_CARE_PROVIDER_SITE_OTHER): Payer: Medicare PPO

## 2019-12-22 DIAGNOSIS — Z780 Asymptomatic menopausal state: Secondary | ICD-10-CM | POA: Diagnosis not present

## 2019-12-22 DIAGNOSIS — Z0000 Encounter for general adult medical examination without abnormal findings: Secondary | ICD-10-CM | POA: Diagnosis not present

## 2019-12-22 NOTE — Patient Instructions (Signed)
[name] , Thank you for taking time to come for your Medicare Wellness Visit. I appreciate your ongoing commitment to your health goals. Please review the following plan we discussed and let me know if I can assist you in the future.   Screening recommendations/referrals: Colonoscopy: Up to date, next due 02/28/2025 Mammogram: Up to date, next due 11/01/2020 Bone Density: Currently due, orders placed this visit. Recommended yearly ophthalmology/optometry visit for glaucoma screening and checkup Recommended yearly dental visit for hygiene and checkup  Vaccinations: Influenza vaccine: Up to date, next due this fall 2021 Pneumococcal vaccine: Completed series Tdap vaccine: Currently due, please contact your insurance company to discuss cost Shingles vaccine: Currently due, please contact your pharmacy to discuss cost and to receive     Advanced directives: Advance directive discussed with you today. Even though you declined this today please call our office should you change your mind and we can give you the proper paperwork for you to fill out.   Conditions/risks identified: None   Next appointment: 12/22/2020 @ 8:15 am with [facility] 65 Years and Older, Female Preventive care refers to lifestyle choices and visits with your health care provider that can promote health and wellness. What does preventive care include?  A yearly physical exam. This is also called an annual well check.  Dental exams once or twice a year.  Routine eye exams. Ask your health care provider how often you should have your eyes checked.  Personal lifestyle choices, including:  Daily care of your teeth and gums.  Regular physical activity.  Eating a healthy diet.  Avoiding tobacco and drug use.  Limiting alcohol use.  Practicing safe sex.  Taking low-dose aspirin every day.  Taking vitamin and mineral supplements as recommended by your health care  provider. What happens during an annual well check? The services and screenings done by your health care provider during your annual well check will depend on your age, overall health, lifestyle risk factors, and family history of disease. Counseling  Your health care provider may ask you questions about your:  Alcohol use.  Tobacco use.  Drug use.  Emotional well-being.  Home and relationship well-being.  Sexual activity.  Eating habits.  History of falls.  Memory and ability to understand (cognition).  Work and work Statistician.  Reproductive health. Screening  You may have the following tests or measurements:  Height, weight, and BMI.  Blood pressure.  Lipid and cholesterol levels. These may be checked every 5 years, or more frequently if you are over 73 years old.  Skin check.  Lung cancer screening. You may have this screening every year starting at age 110 if you have a 30-pack-year history of smoking and currently smoke or have quit within the past 15 years.  Fecal occult blood test (FOBT) of the stool. You may have this test every year starting at age 78.  Flexible sigmoidoscopy or colonoscopy. You may have a sigmoidoscopy every 5 years or a colonoscopy every 10 years starting at age 69.  Hepatitis C blood test.  Hepatitis B blood test.  Sexually transmitted disease (STD) testing.  Diabetes screening. This is done by checking your blood sugar (glucose) after you have not eaten for a while (fasting). You may have this done every 1-3 years.  Bone density scan. This is done to screen for osteoporosis. You may have this done starting at age 60.  Mammogram. This may be done every 1-2 years. Talk  to your health care provider about how often you should have regular mammograms. Talk with your health care provider about your test results, treatment options, and if necessary, the need for more tests. Vaccines  Your health care provider may recommend certain  vaccines, such as:  Influenza vaccine. This is recommended every year.  Tetanus, diphtheria, and acellular pertussis (Tdap, Td) vaccine. You may need a Td booster every 10 years.  Zoster vaccine. You may need this after age 40.  Pneumococcal 13-valent conjugate (PCV13) vaccine. One dose is recommended after age 89.  Pneumococcal polysaccharide (PPSV23) vaccine. One dose is recommended after age 52. Talk to your health care provider about which screenings and vaccines you need and how often you need them. This information is not intended to replace advice given to you by your health care provider. Make sure you discuss any questions you have with your health care provider. Document Released: 05/27/2015 Document Revised: 01/18/2016 Document Reviewed: 03/01/2015 Elsevier Interactive Patient Education  2017 [place] Prevention in the Home Falls can cause injuries. They can happen to people of all ages. There are many things you can do to make your home safe and to help prevent falls. What can I do on the outside of my home?  Regularly fix the edges of walkways and driveways and fix any cracks.  Remove anything that might make you trip as you walk through a door, such as a raised step or threshold.  Trim any bushes or trees on the path to your home.  Use bright outdoor lighting.  Clear any walking paths of anything that might make someone trip, such as rocks or tools.  Regularly check to see if handrails are loose or broken. Make sure that both sides of any steps have handrails.  Any raised decks and porches should have guardrails on the edges.  Have any leaves, snow, or ice cleared regularly.  Use sand or salt on walking paths during winter.  Clean up any spills in your garage right away. This includes oil or grease spills. What can I do in the bathroom?  Use night lights.  Install grab bars by the toilet and in the tub and shower. Do not use towel bars as grab  bars.  Use non-skid mats or decals in the tub or shower.  If you need to sit down in the shower, use a plastic, non-slip stool.  Keep the floor dry. Clean up any water that spills on the floor as soon as it happens.  Remove soap buildup in the tub or shower regularly.  Attach bath mats securely with double-sided non-slip rug tape.  Do not have throw rugs and other things on the floor that can make you trip. What can I do in the bedroom?  Use night lights.  Make sure that you have a light by your bed that is easy to reach.  Do not use any sheets or blankets that are too big for your bed. They should not hang down onto the floor.  Have a firm chair that has side arms. You can use this for support while you get dressed.  Do not have throw rugs and other things on the floor that can make you trip. What can I do in the kitchen?  Clean up any spills right away.  Avoid walking on wet floors.  Keep items that you use a lot in easy-to-reach places.  If you need to reach something above you, use a strong step stool that  has a grab bar.  Keep electrical cords out of the way.  Do not use floor polish or wax that makes floors slippery. If you must use wax, use non-skid floor wax.  Do not have throw rugs and other things on the floor that can make you trip. What can I do with my stairs?  Do not leave any items on the stairs.  Make sure that there are handrails on both sides of the stairs and use them. Fix handrails that are broken or loose. Make sure that handrails are as long as the stairways.  Check any carpeting to make sure that it is firmly attached to the stairs. Fix any carpet that is loose or worn.  Avoid having throw rugs at the top or bottom of the stairs. If you do have throw rugs, attach them to the floor with carpet tape.  Make sure that you have a light switch at the top of the stairs and the bottom of the stairs. If you do not have them, ask someone to add them for  you. What else can I do to help prevent falls?  Wear shoes that:  Do not have high heels.  Have rubber bottoms.  Are comfortable and fit you well.  Are closed at the toe. Do not wear sandals.  If you use a stepladder:  Make sure that it is fully opened. Do not climb a closed stepladder.  Make sure that both sides of the stepladder are locked into place.  Ask someone to hold it for you, if possible.  Clearly mark and make sure that you can see:  Any grab bars or handrails.  First and last steps.  Where the edge of each step is.  Use tools that help you move around (mobility aids) if they are needed. These include:  Canes.  Walkers.  Scooters.  Crutches.  Turn on the lights when you go into a dark area. Replace any light bulbs as soon as they burn out.  Set up your furniture so you have a clear path. Avoid moving your furniture around.  If any of your floors are uneven, fix them.  If there are any pets around you, be aware of where they are.  Review your medicines with your doctor. Some medicines can make you feel dizzy. This can increase your chance of falling. Ask your doctor what other things that you can do to help prevent falls. This information is not intended to replace advice given to you by your health care provider. Make sure you discuss any questions you have with your health care provider. Document Released: 02/24/2009 Document Revised: 10/06/2015 Document Reviewed: 06/04/2014 Elsevier Interactive Patient Education  2017 [employer].

## 2019-12-22 NOTE — Progress Notes (Signed)
Subjective:   Erin Hatfield is a 69 y.o. female who presents for Medicare Annual (Subsequent) preventive examination.  I connected with Annalysa Mohammad today by telephone and verified that I am speaking with the correct person using two identifiers. Location patient: home Location provider: work Persons participating in the virtual visit: patient, provider.   I discussed the limitations, risks, security and privacy concerns of performing an evaluation and management service by telephone and the availability of in person appointments. I also discussed with the patient that there may be a patient responsible charge related to this service. The patient expressed understanding and verbally consented to this telephonic visit.    Interactive audio and video telecommunications were attempted between this provider and patient, however failed, due to patient having technical difficulties OR patient did not have access to video capability.  We continued and completed visit with audio only.      Review of Systems    N/A Cardiac Risk Factors include: advanced age (>66mn, >>24women);diabetes mellitus;dyslipidemia;hypertension     Objective:    There were no vitals filed for this visit. There is no height or weight on file to calculate BMI.  Advanced Directives 12/22/2019 10/13/2018  Does Patient Have a Medical Advance Directive? No No  Would patient like information on creating a medical advance directive? No - Patient declined No - Patient declined    Current Medications (verified) Outpatient Encounter Medications as of 12/22/2019  Medication Sig  . Accu-Chek Softclix Lancets lancets 1 each by Other route. Use once daily Dx E11.9  . Alcohol Swabs (B-D SINGLE USE SWABS REGULAR) PADS Use to clean finger before checking blood sugar 2 times daily  . amitriptyline (ELAVIL) 50 MG tablet TAKE 1 TABLET BY MOUTH AT BEDTIME  . aspirin 81 MG tablet Take 81 mg by mouth daily.    .Marland Kitchenatenolol  (TENORMIN) 50 MG tablet Take 1 tablet (50 mg total) by mouth daily. (Patient taking differently: Take 75 mg by mouth daily. )  . Blood Glucose Calibration (ACCU-CHEK GUIDE CONTROL) LIQD 1 Bottle by In Vitro route as needed.  . Blood Glucose Monitoring Suppl (ACCU-CHEK GUIDE ME) w/Device KIT 2 Sticks by Does not apply route 3 times/day as needed-between meals & bedtime. Used to check blood sugar 2 times daily  . cetirizine (ZYRTEC) 10 MG tablet Take 10 mg by mouth as needed.   . cholecalciferol (VITAMIN D) 1000 UNITS tablet Take 1,000 Units by mouth daily.    . Chromium-Cinnamon (CINNAMON PLUS CHROMIUM) 100-500 MCG-MG CAPS Take by mouth 2 (two) times daily.  . fenofibrate (TRICOR) 145 MG tablet Take 1 tablet (145 mg total) by mouth daily.  . fish oil-omega-3 fatty acids 1000 MG capsule Take 1 g by mouth daily.    .Marland Kitchenglucose blood (ACCU-CHEK GUIDE) test strip Used to check blood sugar 2 times daily  . ibuprofen (ADVIL) 800 MG tablet Take 1 tablet (800 mg total) by mouth every 8 (eight) hours as needed.  .Marland Kitchenlisinopril (ZESTRIL) 10 MG tablet Take 1 tablet (10 mg total) by mouth daily.  . metFORMIN (GLUCOPHAGE) 500 MG tablet TAKE 1 TABLET BY MOUTH IN THE MORNING AND 2 IN THE EVENING  . Multiple Vitamin (MULTIVITAMIN) tablet Take 1 tablet by mouth daily.  . simvastatin (ZOCOR) 5 MG tablet Take 1 tablet (5 mg total) by mouth daily.  . cyclobenzaprine (FLEXERIL) 5 MG tablet Take 1 tablet (5 mg total) by mouth 3 (three) times daily as needed for muscle spasms. (Patient not  taking: Reported on 12/22/2019)  . [DISCONTINUED] methylPREDNISolone (MEDROL DOSEPAK) 4 MG TBPK tablet Take as directed   No facility-administered encounter medications on file as of 12/22/2019.    Allergies (verified) Patient has no known allergies.   History: Past Medical History:  Diagnosis Date  . Allergy   . Bunion, left foot   . Complication of anesthesia   . Diabetes mellitus without complication ([place])    type 2  . Family  history of breast cancer   . Family history of ovarian cancer   . Hot flashes   . Hypertension   . PONV (postoperative nausea and vomiting)    Past Surgical History:  Procedure Laterality Date  . LAPAROSCOPIC BILATERAL SALPINGO OOPHERECTOMY Bilateral 10/13/2018   Procedure: LAPAROSCOPIC BILATERAL SALPINGO OOPHORECTOMY;  Surgeon: Megan Salon, MD;  [facility];  Service: Gynecology;  Laterality: Bilateral;  . VAGINAL HYSTERECTOMY  2002   fibroids partial   Family History  Problem Relation Age of Onset  . Diabetes Sister   . Ovarian cancer Sister 12  . Breast cancer Mother 61  . Lung cancer Father        Smoker  . Diabetes Other   . Hypertension Other   . Diabetes Sister   . Breast cancer Niece 84  . Colon cancer Neg Hx    Social History   Socioeconomic History  . Marital status: Married    Spouse name: Not on file  . Number of children: Not on file  . Years of education: Not on file  . Highest education level: Not on file  Occupational History  . Not on file  Tobacco Use  . Smoking status: Never Smoker  . Smokeless tobacco: Never Used  Vaping Use  . Vaping Use: Never used  Substance and Sexual Activity  . Alcohol use: No  . Drug use: No  . Sexual activity: Not Currently  Other Topics Concern  . 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 Difficulty of Paying Living Expenses: Not hard at all  Food Insecurity: No Food Insecurity  . Worried About Charity fundraiser in the Last Year: Never true  . Ran Out of Food in the Last Year: Never true  Transportation Needs: No Transportation Needs  . Lack of Transportation (Medical): No  . Lack of Transportation (Non-Medical): No  Physical Activity: Sufficiently  Active  . Days of Exercise per Week: 3 days  . Minutes of Exercise per Session: 50 min  Stress: No Stress Concern Present  . Feeling of Stress : Not at all  Social Connections: Moderately Integrated  . Frequency of Communication with Friends and Family: More than three times a week  . Frequency of Social Gatherings with Friends and Family: Once a week  . Attends Religious Services: More than 4 times per year  . Active Member of Clubs or Organizations: No  . Attends Archivist Meetings: Never  . Marital Status: Married    Tobacco Counseling Counseling given: Not Answered   Clinical Intake:  Pre-visit preparation completed: Yes  Pain : No/denies pain     Nutritional Risks: None Diabetes: Yes CBG done?: No Did pt. bring in CBG monitor from home?: No (Patient  states checks her blood sugar every morning fasting. Occassionally checks before)  How often do you need to have someone help you when you read instructions, pamphlets, or other written materials from your doctor or pharmacy?: 1 - Never What is the last grade level you completed in school?: 12th Grade  Diabetic?yes  Interpreter Needed?: No  Information entered by :: [place] of Daily Living In your present state of health, do you have any difficulty performing the following activities: 12/22/2019  Hearing? N  Vision? N  Difficulty concentrating or making decisions? N  Walking or climbing stairs? N  Dressing or bathing? N  Doing errands, shopping? N  Preparing Food and eating ? N  Using the Toilet? N  In the past six months, have you accidently leaked urine? N  Do you have problems with loss of bowel control? N  Managing your Medications? N  Managing your Finances? N  Housekeeping or managing your Housekeeping? N  Some recent data might be hidden    Patient Care Team: [name], NP as PCP - General (Family Medicine)  Indicate any recent Medical Services you may have received  from other than [facility] providers in the past year (date may be approximate).     Assessment:   This is a routine wellness examination for [place].  Hearing/Vision screen  Hearing Screening   125Hz 250Hz 500Hz 1000Hz 2000Hz 3000Hz 4000Hz 6000Hz 8000Hz  Right ear:           Left ear:           Vision Screening Comments: Patient gets annual eye exams    Dietary issues and exercise activities discussed: Current Exercise Habits: Home exercise routine, Type of exercise: walking, Time (Minutes): 45, Frequency (Times/Week): 3, Weekly Exercise (Minutes/Week): 135, Exercise limited by: orthopedic condition(s)  Goals    . Patient Stated     I will continue to walk 3 days per week for 45 minutes       Depression Screen PHQ 2/9 Scores 12/22/2019 06/05/2018 05/02/2017 04/26/2015 01/06/2014  PHQ - 2 Score 0 0 0 0 0  PHQ- 9 Score 0 - - - -    Fall Risk Fall Risk  12/22/2019 06/05/2018 05/02/2017 04/26/2015 01/06/2014  Falls in the past year? - 0 No No No  Number falls in past yr: 0 - - - -  Injury with Fall? 0 - - - -  Risk for fall due to : Medication side effect - - - -    Any stairs in or around the home? Yes  If so, are there any without handrails? No  Home free of loose throw rugs in walkways, pet beds, electrical cords, etc? Yes  Adequate lighting in your home to reduce risk of falls? Yes   ASSISTIVE DEVICES UTILIZED TO PREVENT FALLS:  Life alert? No  Use of a cane, walker or w/c? No  Grab bars in the bathroom? No  Shower chair or bench in shower? No  Elevated toilet seat or a handicapped toilet? No     Cognitive Function:     6CIT Screen 12/22/2019  What Year? 0 points  What month? 0 points  What time? 0 points  Count back from 20 0 points  Months in reverse 0 points  Repeat phrase 0 points  Total Score 0    Immunizations Immunization History  Administered Date(s) Administered  . Fluad Quad(high Dose 65+) 02/07/2019  . Influenza Split 02/13/2011, 03/24/2012  .  Influenza Whole 02/25/2008, 02/02/2009  .  Influenza, High Dose Seasonal PF 04/26/2015, 05/01/2016, 05/02/2017, 03/20/2018  . Influenza,inj,Quad PF,6+ Mos 04/16/2013, 01/06/2014  . Moderna SARS-COVID-2 Vaccination 06/19/2019, 07/17/2019  . Pneumococcal Conjugate-13 04/20/2014  . Pneumococcal Polysaccharide-23 04/26/2015  . Td 12/11/2005  . Zoster 04/11/2011    TDAP status: Due, Education has been provided regarding the importance of this vaccine. Advised may receive this vaccine at local pharmacy or Health Dept. Aware to provide a copy of the vaccination record if obtained from local pharmacy or Health Dept. Verbalized acceptance and understanding. Flu Vaccine status: Up to date Pneumococcal vaccine status: Up to date Covid-19 vaccine status: Completed vaccines  Qualifies for Shingles Vaccine? Yes   Zostavax completed Yes   Shingrix Completed?: No.    Education has been provided regarding the importance of this vaccine. Patient has been advised to call insurance company to determine out of pocket expense if they have not yet received this vaccine. Advised may also receive vaccine at local pharmacy or Health Dept. Verbalized acceptance and understanding.  Screening Tests Health Maintenance  Topic Date Due  . TETANUS/TDAP  12/12/2015  . FOOT EXAM  06/06/2019  . INFLUENZA VACCINE  12/13/2019  . OPHTHALMOLOGY EXAM  12/22/2019  . HEMOGLOBIN A1C  04/01/2020  . MAMMOGRAM  11/01/2021  . COLONOSCOPY  02/28/2025  . DEXA SCAN  Completed  . COVID-19 Vaccine  Completed  . Hepatitis C Screening  Completed  . PNA vac Low Risk Adult  Completed    Health Maintenance  Health Maintenance Due  Topic Date Due  . TETANUS/TDAP  12/12/2015  . FOOT EXAM  06/06/2019  . INFLUENZA VACCINE  12/13/2019  . OPHTHALMOLOGY EXAM  12/22/2019    Colorectal cancer screening: Completed 03/01/2015. Repeat every 10 years Mammogram status: Completed 11/02/2019. Repeat every year Bone Density status: Completed  03/26/2016. Results reflect: Bone density results: NORMAL. Repeat every 2 years.  Lung Cancer Screening: (Low Dose CT Chest recommended if Age 74-80 years, 30 pack-year currently smoking OR have quit w/in 15years.) does not qualify.   Lung Cancer Screening Referral: N/A  Additional Screening:  Hepatitis C Screening: does qualify; Completed 06/05/2018  Vision Screening: Recommended annual ophthalmology exams for early detection of glaucoma and other disorders of the eye. Is the patient up to date with their annual eye exam?  Yes  Who is the provider or what is the name of the office in which the patient attends annual eye exams? [facility] Truman Hayward [facility]  If pt is not established with a provider, would they like to be referred to a provider to establish care? No .   Dental Screening: Recommended annual dental exams for proper oral hygiene  Community Resource Referral / Chronic Care Management: CRR required this visit?  No   CCM required this visit?  No      Plan:     I have personally reviewed and noted the following in the patient's chart:   . Medical and social history . Use of alcohol, tobacco or illicit drugs  . Current medications and supplements . Functional ability and status . Nutritional status . Physical activity . Advanced directives . List of other physicians . Hospitalizations, surgeries, and ER visits in previous 12 months . Vitals . Screenings to include cognitive, depression, and falls . Referrals and appointments  In addition, I have reviewed and discussed with patient certain preventive protocols, quality metrics, and best practice recommendations. A written personalized care plan for preventive services as well as general preventive health recommendations were provided to patient.  Ofilia Neas, LPN   [id number]   Nurse Notes: None

## 2019-12-23 ENCOUNTER — Ambulatory Visit: Payer: Medicare PPO | Admitting: Adult Health

## 2019-12-23 ENCOUNTER — Encounter: Payer: Self-pay | Admitting: Adult Health

## 2019-12-23 ENCOUNTER — Other Ambulatory Visit: Payer: Self-pay

## 2019-12-23 VITALS — BP 140/80 | Temp 97.7°F | Wt 168.0 lb

## 2019-12-23 DIAGNOSIS — E139 Other specified diabetes mellitus without complications: Secondary | ICD-10-CM | POA: Diagnosis not present

## 2019-12-23 LAB — POCT GLYCOSYLATED HEMOGLOBIN (HGB A1C): HbA1c, POC (controlled diabetic range): 7 % (ref 0.0–7.0)

## 2019-12-23 MED ORDER — METFORMIN HCL 500 MG PO TABS: ORAL_TABLET | ORAL | 0 refills | Status: DC

## 2019-12-23 NOTE — Progress Notes (Signed)
Subjective:    Patient ID: Erin Hatfield, female    DOB: 01-Mar-1951, 69 y.o.   MRN: [id number]  HPI  69 year old female who  has a past medical history of Allergy, Bunion, left foot, Complication of anesthesia, Diabetes mellitus without complication ([place]), Family history of breast cancer, Family history of ovarian cancer, Hot flashes, Hypertension, and PONV (postoperative nausea and vomiting).  She presents to the office today for 65-monthfollow-up regarding diabetes mellitus.  She is currently maintained on Metformin 500 mg in the morning and 1000 mg in the evening.  Her A1c 3 months ago was 6.9, this was a drop from 7.4- 3 months prior.   She continues to stay active and tries to eat a healthy diet.   She has been monitoring her BP at home and reports readings of 120-130/80's    Review of Systems See HPI   Past Medical History:  Diagnosis Date  . Allergy   . Bunion, left foot   . Complication of anesthesia   . Diabetes mellitus without complication ([place]    type 2  . Family history of breast cancer   . Family history of ovarian cancer   . Hot flashes   . Hypertension   . PONV (postoperative nausea and vomiting)     Social History   Socioeconomic History  . Marital status: Married    Spouse name: Not on file  . Number of children: Not on file  . Years of education: Not on file  . Highest education level: Not on file  Occupational History  . Not on file  Tobacco Use  . Smoking status: Never Smoker  . Smokeless tobacco: Never Used  Vaping Use  . Vaping Use: Never used  Substance and Sexual Activity  . Alcohol use: No  . Drug use: No  . Sexual activity: Not Currently  Other Topics Concern  . 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 Difficulty of Paying Living Expenses: Not hard at all  Food Insecurity: No Food Insecurity  . Worried About RCharity fundraiserin the Last Year: Never true  . Ran Out of Food in the Last Year: Never true  Transportation Needs: No Transportation Needs  . Lack of Transportation (Medical): No  . Lack of Transportation (Non-Medical): No  Physical Activity: Sufficiently Active  . Days of Exercise per Week: 3 days  . Minutes of Exercise per Session: 50 min  Stress: No Stress Concern Present  . Feeling of Stress : Not at all  Social Connections: Moderately Integrated  . Frequency of Communication with Friends and Family: More than three times a week  . Frequency of Social Gatherings with Friends and Family: Once a week  . Attends Religious Services: More than 4 times per year  . Active Member of Clubs or Organizations: No  . Attends CArchivistMeetings: Never  . Marital Status: Married  IHuman resources officerViolence: Not At Risk  . Fear of Current or Ex-Partner: No  . Emotionally Abused: No  . Physically Abused: No  . Sexually Abused: No    Past Surgical History:  Procedure Laterality Date  . LAPAROSCOPIC BILATERAL SALPINGO OOPHERECTOMY Bilateral 10/13/2018   Procedure:  LAPAROSCOPIC BILATERAL SALPINGO OOPHORECTOMY;  Surgeon: Megan Salon, MD;  Location: [facility];  Service: Gynecology;  Laterality: Bilateral;  . VAGINAL HYSTERECTOMY  2002   fibroids partial    Family History  Problem Relation Age of Onset  . Diabetes Sister   . Ovarian cancer Sister 39  . Breast cancer Mother 51  . Lung cancer Father        Smoker  . Diabetes Other   . Hypertension Other   . Diabetes Sister   . Breast cancer Niece 53  . Colon cancer Neg Hx     No Known Allergies  Current Outpatient Medications on File Prior to Visit  Medication Sig Dispense Refill  . Accu-Chek Softclix Lancets lancets 1 each by Other route. Use once daily Dx E11.9    .  Alcohol Swabs (B-D SINGLE USE SWABS REGULAR) PADS Use to clean finger before checking blood sugar 2 times daily 100 each 1  . amitriptyline (ELAVIL) 50 MG tablet TAKE 1 TABLET BY MOUTH AT BEDTIME 90 tablet 1  . aspirin 81 MG tablet Take 81 mg by mouth daily.      Marland Kitchen atenolol (TENORMIN) 50 MG tablet Take 1 tablet (50 mg total) by mouth daily. (Patient taking differently: Take 75 mg by mouth daily. ) 90 tablet 3  . Blood Glucose Calibration (ACCU-CHEK GUIDE CONTROL) LIQD 1 Bottle by In Vitro route as needed. 1 each 1  . Blood Glucose Monitoring Suppl (ACCU-CHEK GUIDE ME) w/Device KIT 2 Sticks by Does not apply route 3 times/day as needed-between meals & bedtime. Used to check blood sugar 2 times daily 1 kit 0  . cetirizine (ZYRTEC) 10 MG tablet Take 10 mg by mouth as needed.     . cholecalciferol (VITAMIN D) 1000 UNITS tablet Take 1,000 Units by mouth daily.      . Chromium-Cinnamon (CINNAMON PLUS CHROMIUM) 100-500 MCG-MG CAPS Take by mouth 2 (two) times daily.    . fenofibrate (TRICOR) 145 MG tablet Take 1 tablet (145 mg total) by mouth daily. 90 tablet 3  . fish oil-omega-3 fatty acids 1000 MG capsule Take 1 g by mouth daily.      Marland Kitchen glucose blood (ACCU-CHEK GUIDE) test strip Used to check blood sugar 2 times daily 100 each 12  . ibuprofen (ADVIL) 800 MG tablet Take 1 tablet (800 mg total) by mouth every 8 (eight) hours as needed. 30 tablet 0  . lisinopril (ZESTRIL) 10 MG tablet Take 1 tablet (10 mg total) by mouth daily. 90 tablet 3  . Multiple Vitamin (MULTIVITAMIN) tablet Take 1 tablet by mouth daily.    . simvastatin (ZOCOR) 5 MG tablet Take 1 tablet (5 mg total) by mouth daily. 90 tablet 3   No current facility-administered medications on file prior to visit.    BP 140/80   Temp 97.7 F (36.5 C)   Wt 168 lb (76.2 kg)   LMP 05/14/2000 (Approximate)   BMI 31.74 kg/m       Objective:   Physical Exam Vitals and nursing note reviewed.  Constitutional:      Appearance: Normal  appearance.  Cardiovascular:     Rate and Rhythm: Normal rate and regular rhythm.     Pulses: Normal pulses.     Heart sounds: Normal heart sounds.  Pulmonary:     Effort: Pulmonary effort is normal.     Breath sounds: Normal breath sounds.  Skin:    General: Skin is warm and dry.  Capillary Refill: Capillary refill takes less than 2 seconds.  Neurological:     General: No focal deficit present.     Mental Status: She is alert and oriented to person, place, and time.  Psychiatric:        Mood and Affect: Mood normal.        Behavior: Behavior normal.        Thought Content: Thought content normal.        Judgment: Judgment normal.       Assessment & Plan:  1. Diabetes 1.5, managed as type 2 ([place])  - POCT A1C- 7.0 - metFORMIN (GLUCOPHAGE) 500 MG tablet; TAKE 1 TABLET BY MOUTH IN THE MORNING AND 2 IN THE EVENING  Dispense: 270 tablet; Refill: 0 - Will have her follow up in 6 months at CPE  - She will follow up sooner if she has issues with her BS or BP at home  [name], NP

## 2020-01-27 ENCOUNTER — Other Ambulatory Visit: Payer: Self-pay | Admitting: Adult Health

## 2020-01-27 DIAGNOSIS — E139 Other specified diabetes mellitus without complications: Secondary | ICD-10-CM

## 2020-04-11 ENCOUNTER — Ambulatory Visit
Admission: RE | Admit: 2020-04-11 | Discharge: 2020-04-11 | Disposition: A | Discharge status: Home / Self Care | Payer: Medicare PPO | Source: Ambulatory Visit | Attending: Adult Health | Admitting: Adult Health

## 2020-04-11 ENCOUNTER — Other Ambulatory Visit: Payer: Self-pay

## 2020-04-11 DIAGNOSIS — Z780 Asymptomatic menopausal state: Secondary | ICD-10-CM

## 2020-05-11 ENCOUNTER — Other Ambulatory Visit: Payer: Self-pay

## 2020-05-11 ENCOUNTER — Ambulatory Visit (INDEPENDENT_AMBULATORY_CARE_PROVIDER_SITE_OTHER): Payer: Medicare PPO | Admitting: *Deleted

## 2020-05-11 DIAGNOSIS — Z23 Encounter for immunization: Secondary | ICD-10-CM

## 2020-05-11 NOTE — Progress Notes (Signed)
Administered high dose flu shot today on right deltoid.

## 2020-06-06 ENCOUNTER — Other Ambulatory Visit: Payer: Self-pay | Admitting: Adult Health

## 2020-06-06 DIAGNOSIS — E139 Other specified diabetes mellitus without complications: Secondary | ICD-10-CM

## 2020-06-06 DIAGNOSIS — E1140 Type 2 diabetes mellitus with diabetic neuropathy, unspecified: Secondary | ICD-10-CM

## 2020-06-07 NOTE — Telephone Encounter (Signed)
Sent to the pharmacy by e-scribe. 

## 2020-06-27 ENCOUNTER — Other Ambulatory Visit: Payer: Self-pay

## 2020-06-28 ENCOUNTER — Ambulatory Visit (INDEPENDENT_AMBULATORY_CARE_PROVIDER_SITE_OTHER): Payer: Medicare PPO | Admitting: Adult Health

## 2020-06-28 ENCOUNTER — Encounter: Payer: Self-pay | Admitting: Adult Health

## 2020-06-28 VITALS — BP 138/88 | Temp 98.5°F | Ht 63.5 in | Wt 166.0 lb

## 2020-06-28 DIAGNOSIS — E1140 Type 2 diabetes mellitus with diabetic neuropathy, unspecified: Secondary | ICD-10-CM | POA: Diagnosis not present

## 2020-06-28 DIAGNOSIS — Z0000 Encounter for general adult medical examination without abnormal findings: Secondary | ICD-10-CM

## 2020-06-28 DIAGNOSIS — I10 Essential (primary) hypertension: Secondary | ICD-10-CM | POA: Diagnosis not present

## 2020-06-28 DIAGNOSIS — E139 Other specified diabetes mellitus without complications: Secondary | ICD-10-CM

## 2020-06-28 DIAGNOSIS — E782 Mixed hyperlipidemia: Secondary | ICD-10-CM | POA: Diagnosis not present

## 2020-06-28 LAB — LIPID PANEL
Cholesterol: 182 mg/dL (ref 0–200)
HDL: 45.7 mg/dL (ref >39.00)
LDL Cholesterol: 104 mg/dL — ABNORMAL HIGH (ref 0–99)
NonHDL: 136.62
Total CHOL/HDL Ratio: 4
Triglycerides: 163 mg/dL — ABNORMAL HIGH (ref 0.0–149.0)
VLDL: 32.6 mg/dL (ref 0.0–40.0)

## 2020-06-28 LAB — CBC WITH DIFFERENTIAL/PLATELET
Basophils Absolute: 0.1 10*3/uL (ref 0.0–0.1)
Basophils Relative: 0.9 % (ref 0.0–3.0)
Eosinophils Absolute: 0.2 10*3/uL (ref 0.0–0.7)
Eosinophils Relative: 2.3 % (ref 0.0–5.0)
HCT: 37.7 % (ref 36.0–46.0)
Hemoglobin: 12.2 g/dL (ref 12.0–15.0)
Lymphocytes Relative: 55.9 % — ABNORMAL HIGH (ref 12.0–46.0)
Lymphs Abs: 4 10*3/uL (ref 0.7–4.0)
MCHC: 32.3 g/dL (ref 30.0–36.0)
MCV: 84.3 fl (ref 78.0–100.0)
Monocytes Absolute: 0.6 10*3/uL (ref 0.1–1.0)
Monocytes Relative: 8.3 % (ref 3.0–12.0)
Neutro Abs: 2.3 10*3/uL (ref 1.4–7.7)
Neutrophils Relative %: 32.6 % — ABNORMAL LOW (ref 43.0–77.0)
Platelets: 413 10*3/uL — ABNORMAL HIGH (ref 150.0–400.0)
RBC: 4.47 Mil/uL (ref 3.87–5.11)
RDW: 12.6 % (ref 11.5–15.5)
WBC: 7.1 10*3/uL (ref 4.0–10.5)

## 2020-06-28 LAB — COMPREHENSIVE METABOLIC PANEL
ALT: 17 U/L (ref 0–35)
AST: 19 U/L (ref 0–37)
Albumin: 4.8 g/dL (ref 3.5–5.2)
Alkaline Phosphatase: 49 U/L (ref 39–117)
BUN: 10 mg/dL (ref 6–23)
CO2: 30 mEq/L (ref 19–32)
Calcium: 11.6 mg/dL — ABNORMAL HIGH (ref 8.4–10.5)
Chloride: 104 mEq/L (ref 96–112)
Creatinine, Ser: 1.01 mg/dL (ref 0.40–1.20)
GFR: 56.01 mL/min — ABNORMAL LOW (ref >60.00)
Glucose, Bld: 131 mg/dL — ABNORMAL HIGH (ref 70–99)
Potassium: 4.9 mEq/L (ref 3.5–5.1)
Sodium: 145 mEq/L (ref 135–145)
Total Bilirubin: 0.4 mg/dL (ref 0.2–1.2)
Total Protein: 7.7 g/dL (ref 6.0–8.3)

## 2020-06-28 LAB — VITAMIN D 25 HYDROXY (VIT D DEFICIENCY, FRACTURES): VITD: 57.81 ng/mL (ref 30.00–100.00)

## 2020-06-28 LAB — HEMOGLOBIN A1C: Hgb A1c MFr Bld: 7.1 % — ABNORMAL HIGH (ref 4.6–6.5)

## 2020-06-28 LAB — TSH: TSH: 3.51 u[IU]/mL (ref 0.35–4.50)

## 2020-06-28 MED ORDER — LISINOPRIL 10 MG PO TABS: 10.0000 mg | ORAL_TABLET | Freq: Every day | ORAL | 3 refills | Status: DC

## 2020-06-28 MED ORDER — ATENOLOL 50 MG PO TABS: 75.0000 mg | ORAL_TABLET | Freq: Every day | ORAL | 3 refills | Status: DC

## 2020-06-28 MED ORDER — SIMVASTATIN 5 MG PO TABS: 5.0000 mg | ORAL_TABLET | Freq: Every day | ORAL | 3 refills | Status: DC

## 2020-06-28 MED ORDER — FENOFIBRATE 145 MG PO TABS: 145.0000 mg | ORAL_TABLET | Freq: Every day | ORAL | 3 refills | Status: DC

## 2020-06-28 NOTE — Progress Notes (Signed)
Subjective:    Patient ID: Erin Hatfield, female    DOB: 09-13-1950, 70 y.o.   MRN: [id number]  HPI  Patient presents for yearly preventative medicine examination. She is a pleasant 70 year old female who  has a past medical history of Allergy, Bunion, left foot, Complication of anesthesia, Diabetes mellitus without complication ([place]), Family history of breast cancer, Family history of ovarian cancer, Hot flashes, Hypertension, and PONV (postoperative nausea and vomiting).  DM -prescribed Metformin 500 mg in the morning and 1000 mg in the evening. She reports that her blood sugars have been trending up to the 160's at times.  Lab Results  Component Value Date   HGBA1C 7.0 12/23/2019   Diabetic Neuropathy -takes Elavil 50 mg nightly.  Feels well controlled on this medication  Hyperlipidemia -currently prescribed TriCor 145 mg daily and simvastatin 5 mg daily.  She denies myalgia or fatigue Lab Results  Component Value Date   CHOL 158 06/26/2019   HDL 40.50 06/26/2019   LDLDIRECT 81.0 06/26/2019   TRIG 215.0 (H) 06/26/2019   CHOLHDL 4 06/26/2019   Essential Hypertension -currently prescribed atenolol 75 mg daily and lisinopril 10 mg daily.  She denies dizziness, lightheadedness, chest pain, or shortness of breath. She does monitor her BP at home and has readings consistently in the 130-140/70-80 BP Readings from Last 3 Encounters:  06/28/20 138/88  12/23/19 140/80  11/13/19 (!) 150/88    All immunizations and health maintenance protocols were reviewed with the patient and needed orders were placed.  Appropriate screening laboratory values were ordered for the patient including screening of hyperlipidemia, renal function and hepatic function.   Medication reconciliation,  past medical history, social history, problem list and allergies were reviewed in detail with the patient  Goals were established with regard to weight loss, exercise, and  diet in compliance with  medications. She is back to walking and eats heart healthy  Wt Readings from Last 3 Encounters:  06/28/20 166 lb (75.3 kg)  12/23/19 168 lb (76.2 kg)  11/13/19 168 lb (76.2 kg)   She is up-to-date on routine colon cancer screening. Diabetic eye exam, mammogram, and bone density screen   Review of Systems  Constitutional: Negative.   HENT: Negative.   Eyes: Negative.   Respiratory: Negative.   Cardiovascular: Negative.   Gastrointestinal: Negative.   Endocrine: Negative.   Genitourinary: Negative.   Musculoskeletal: Negative.   Skin: Negative.   Allergic/Immunologic: Negative.   Neurological: Negative.   Hematological: Negative.   Psychiatric/Behavioral: Negative.      Past Medical History:  Diagnosis Date  . Allergy   . Bunion, left foot   . Complication of anesthesia   . 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 Family history of breast cancer   . Family history of ovarian cancer   . Hot flashes   . Hypertension   . PONV (postoperative nausea and vomiting)     Social History   Socioeconomic History  . Marital status: Married    Spouse name: Not on file  . Number of children: Not on file  . Years of education: Not on file  . Highest education level: Not on file  Occupational History  . Not on file  Tobacco Use  . Smoking status: Never Smoker  . Smokeless tobacco: Never Used  Vaping Use  . Vaping Use: Never used  Substance and Sexual Activity  . Alcohol use: No  . Drug use: No  .  Sexual activity: Not Currently  Other Topics Concern  . 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 Difficulty of Paying Living Expenses: Not hard at all  Food Insecurity: No Food Insecurity  . Worried About Sales executive in the Last Year: Never true  . Ran Out of Food in the Last Year: Never true  Transportation Needs: No Transportation Needs  . Lack of Transportation (Medical): No  . Lack of Transportation (Non-Medical): No  Physical Activity: Sufficiently Active  . Days of Exercise per Week: 3 days  . Minutes of Exercise per Session: 50 min  Stress: No Stress Concern Present  . Feeling of Stress : Not at all  Social Connections: Moderately Integrated  . Frequency of Communication with Friends and Family: More than three times a week  . Frequency of Social Gatherings with Friends and Family: Once a week  . Attends Religious Services: More than 4 times per year  . Active Member of Clubs or Organizations: No  . Attends Archivist Meetings: Never  . Marital Status: Married  Human resources officer Violence: Not At Risk  . Fear of Current or Ex-Partner: No  . Emotionally Abused: No  . Physically Abused: No  . Sexually Abused: No    Past Surgical History:  Procedure Laterality Date  . LAPAROSCOPIC BILATERAL SALPINGO OOPHERECTOMY Bilateral 10/13/2018   Procedure: LAPAROSCOPIC BILATERAL SALPINGO OOPHORECTOMY;  Surgeon: Megan Salon, MD;  Location: [facility];  Service: Gynecology;  Laterality: Bilateral;  . VAGINAL HYSTERECTOMY  2002   fibroids partial    Family History  Problem Relation Age of Onset  . Diabetes Sister   . Ovarian cancer Sister 33  . Breast cancer Mother 12  . Lung cancer Father        Smoker  . Diabetes Other   . Hypertension Other   . Diabetes Sister   . Breast cancer Niece 59  . Colon cancer Neg Hx     No Known Allergies  Current Outpatient Medications on File Prior to Visit  Medication Sig Dispense Refill  . Accu-Chek Softclix Lancets lancets 1 each by Other route. Use once daily Dx E11.9    . Alcohol Swabs (B-D SINGLE USE SWABS REGULAR) PADS Use to clean finger before checking blood sugar 2 times daily 100 each 1  . amitriptyline (ELAVIL) 50  MG tablet TAKE 1 TABLET BY MOUTH AT BEDTIME 90 tablet 0  . aspirin 81 MG tablet Take 81 mg by mouth daily.    . Blood Glucose Calibration (ACCU-CHEK GUIDE CONTROL) LIQD 1 Bottle by In Vitro route as needed. 1 each 1  . Blood Glucose Monitoring Suppl (ACCU-CHEK GUIDE ME) w/Device KIT 2 Sticks by Does not apply route 3 times/day as needed-between meals & bedtime. Used to check blood sugar 2 times daily 1 kit 0  . cetirizine (ZYRTEC) 10 MG tablet Take 10 mg by mouth as needed.    . cholecalciferol (VITAMIN D) 1000 UNITS tablet Take 1,000 Units by mouth daily.    . Chromium-Cinnamon 100-500 MCG-MG CAPS Take by mouth 2 (two) times daily.    . fish oil-omega-3 fatty acids 1000 MG capsule Take 1 g by mouth daily.    Marland Kitchen  glucose blood (ACCU-CHEK GUIDE) test strip Used to check blood sugar 2 times daily 100 each 12  . metFORMIN (GLUCOPHAGE) 500 MG tablet TAKE 1 TABLET BY MOUTH ONCE DAILY IN THE MORNING AND 2 ONCE DAILY IN THE EVENING 270 tablet 0  . Multiple Vitamin (MULTIVITAMIN) tablet Take 1 tablet by mouth daily.     No current facility-administered medications on file prior to visit.    BP 138/88   Temp 98.5 F (36.9 C) (Oral)   Ht 5' 3.5" (1.613 m) Comment: WITH SHOES  Wt 166 lb (75.3 kg)   LMP 05/14/2000 (Approximate)   BMI 28.94 kg/m       Objective:   Physical Exam Vitals and nursing note reviewed.  Constitutional:      General: She is not in acute distress.    Appearance: Normal appearance. She is well-developed. She is not ill-appearing.  HENT:     Head: Normocephalic and atraumatic.     Right Ear: Tympanic membrane, ear canal and external ear normal. There is no impacted cerumen.     Left Ear: Tympanic membrane, ear canal and external ear normal. There is no impacted cerumen.     Nose: Nose normal. No congestion or rhinorrhea.     Mouth/Throat:     Mouth: Mucous membranes are moist.     Pharynx: Oropharynx is clear. No oropharyngeal exudate or posterior oropharyngeal erythema.   Eyes:     General:        Right eye: No discharge.        Left eye: No discharge.     Extraocular Movements: Extraocular movements intact.     Conjunctiva/sclera: Conjunctivae normal.     Pupils: Pupils are equal, round, and reactive to light.  Neck:     Thyroid: No thyromegaly.     Vascular: No carotid bruit.     Trachea: No tracheal deviation.  Cardiovascular:     Rate and Rhythm: Normal rate and regular rhythm.     Pulses: Normal pulses.     Heart sounds: Normal heart sounds. No murmur heard. No friction rub. No gallop.   Pulmonary:     Effort: Pulmonary effort is normal. No respiratory distress.     Breath sounds: Normal breath sounds. No stridor. No wheezing, rhonchi or rales.  Chest:     Chest wall: No tenderness.  Abdominal:     General: Abdomen is flat. Bowel sounds are normal. There is no distension.     Palpations: Abdomen is soft. There is no mass.     Tenderness: There is no abdominal tenderness. There is no right CVA tenderness, left CVA tenderness, guarding or rebound.     Hernia: No hernia is present.  Musculoskeletal:        General: No swelling, tenderness, deformity or signs of injury. Normal range of motion.     Cervical back: Normal range of motion and neck supple.     Right lower leg: No edema.     Left lower leg: No edema.  Lymphadenopathy:     Cervical: No cervical adenopathy.  Skin:    General: Skin is warm and dry.     Coloration: Skin is not jaundiced or pale.     Findings: No bruising, erythema, lesion or rash.  Neurological:     General: No focal deficit present.     Mental Status: She is alert and oriented to person, place, and time.     Cranial Nerves: No cranial nerve deficit.  Sensory: No sensory deficit.     Motor: No weakness.     Coordination: Coordination normal.     Gait: Gait normal.     Deep Tendon Reflexes: Reflexes normal.  Psychiatric:        Mood and Affect: Mood normal.        Behavior: Behavior normal.        Thought  Content: Thought content normal.        Judgment: Judgment normal.       Assessment & Plan:  1. Routine general medical examination at a health care facility - Follow up in one year or sooner if needed - Continue to stay active and eat healthy  - CBC with Differential/Platelet; Future - Comprehensive metabolic panel; Future - Hemoglobin A1c; Future - Lipid panel; Future - TSH; Future - Vitamin D, 25-hydroxy; Future  2. Diabetic neuropathy, painful ([place]) - Continue with Elavil 50 mg  - CBC with Differential/Platelet; Future - Comprehensive metabolic panel; Future - Hemoglobin A1c; Future - Lipid panel; Future - TSH; Future - Vitamin D, 25-hydroxy; Future  3. Mixed hyperlipidemia - Consider increase in statin  - fenofibrate (TRICOR) 145 MG tablet; Take 1 tablet (145 mg total) by mouth daily.  Dispense: 90 tablet; Refill: 3 - simvastatin (ZOCOR) 5 MG tablet; Take 1 tablet (5 mg total) by mouth daily.  Dispense: 90 tablet; Refill: 3 - CBC with Differential/Platelet; Future - Comprehensive metabolic panel; Future - Hemoglobin A1c; Future - Lipid panel; Future - TSH; Future - Vitamin D, 25-hydroxy; Future  4. Essential hypertension - No change.  - lisinopril (ZESTRIL) 10 MG tablet; Take 1 tablet (10 mg total) by mouth daily.  Dispense: 90 tablet; Refill: 3 - atenolol (TENORMIN) 50 MG tablet; Take 1.5 tablets (75 mg total) by mouth daily.  Dispense: 135 tablet; Refill: 3  5. Diabetes 1.5, managed as type 2 ([place]) - Consider increase in metformin  - CBC with Differential/Platelet; Future - Comprehensive metabolic panel; Future - Hemoglobin A1c; Future - Lipid panel; Future - TSH; Future - Vitamin D, 25-hydroxy; Future  [name], NP

## 2020-06-29 ENCOUNTER — Other Ambulatory Visit: Payer: Self-pay | Admitting: Adult Health

## 2020-06-29 ENCOUNTER — Telehealth: Payer: Self-pay | Admitting: Adult Health

## 2020-06-29 DIAGNOSIS — E139 Other specified diabetes mellitus without complications: Secondary | ICD-10-CM

## 2020-06-29 MED ORDER — METFORMIN HCL 500 MG PO TABS: ORAL_TABLET | ORAL | 1 refills | Status: DC

## 2020-06-29 NOTE — Telephone Encounter (Signed)
Spoke to patient informed her of her labs.  A1c has stayed stable at 7.1, we will keep her on her current dose of Metformin  Centimeters was elevated at 11.9.  I am going to have her follow-up in 7 to 10 days for repeat lab work including PTH.  Her vitamin D level was normal.

## 2020-07-04 ENCOUNTER — Other Ambulatory Visit: Payer: Self-pay

## 2020-07-05 ENCOUNTER — Other Ambulatory Visit (INDEPENDENT_AMBULATORY_CARE_PROVIDER_SITE_OTHER): Payer: Medicare PPO

## 2020-07-05 LAB — CBC WITH DIFFERENTIAL/PLATELET
Basophils Absolute: 0 10*3/uL (ref 0.0–0.1)
Basophils Relative: 0.8 % (ref 0.0–3.0)
Eosinophils Absolute: 0.1 10*3/uL (ref 0.0–0.7)
Eosinophils Relative: 2.3 % (ref 0.0–5.0)
HCT: 34.5 % — ABNORMAL LOW (ref 36.0–46.0)
Hemoglobin: 11.3 g/dL — ABNORMAL LOW (ref 12.0–15.0)
Lymphocytes Relative: 51.6 % — ABNORMAL HIGH (ref 12.0–46.0)
Lymphs Abs: 3.2 10*3/uL (ref 0.7–4.0)
MCHC: 32.7 g/dL (ref 30.0–36.0)
MCV: 84 fl (ref 78.0–100.0)
Monocytes Absolute: 0.5 10*3/uL (ref 0.1–1.0)
Monocytes Relative: 8.1 % (ref 3.0–12.0)
Neutro Abs: 2.3 10*3/uL (ref 1.4–7.7)
Neutrophils Relative %: 37.2 % — ABNORMAL LOW (ref 43.0–77.0)
Platelets: 387 10*3/uL (ref 150.0–400.0)
RBC: 4.11 Mil/uL (ref 3.87–5.11)
RDW: 12.6 % (ref 11.5–15.5)
WBC: 6.2 10*3/uL (ref 4.0–10.5)

## 2020-07-05 LAB — BASIC METABOLIC PANEL
BUN: 13 mg/dL (ref 6–23)
CO2: 27 mEq/L (ref 19–32)
Calcium: 9.8 mg/dL (ref 8.4–10.5)
Chloride: 103 mEq/L (ref 96–112)
Creatinine, Ser: 0.97 mg/dL (ref 0.40–1.20)
GFR: 58.79 mL/min — ABNORMAL LOW (ref >60.00)
Glucose, Bld: 138 mg/dL — ABNORMAL HIGH (ref 70–99)
Potassium: 4.1 mEq/L (ref 3.5–5.1)
Sodium: 139 mEq/L (ref 135–145)

## 2020-07-06 LAB — PTH, INTACT AND CALCIUM
Calcium: 9.8 mg/dL (ref 8.6–10.4)
PTH: 32 pg/mL (ref 14–64)

## 2020-07-07 ENCOUNTER — Encounter: Payer: Self-pay | Admitting: Adult Health

## 2020-08-31 ENCOUNTER — Other Ambulatory Visit: Payer: Self-pay | Admitting: Adult Health

## 2020-08-31 DIAGNOSIS — E139 Other specified diabetes mellitus without complications: Secondary | ICD-10-CM

## 2020-08-31 DIAGNOSIS — E1140 Type 2 diabetes mellitus with diabetic neuropathy, unspecified: Secondary | ICD-10-CM

## 2020-09-05 ENCOUNTER — Other Ambulatory Visit: Payer: Self-pay | Admitting: Adult Health

## 2020-09-05 DIAGNOSIS — Z1231 Encounter for screening mammogram for malignant neoplasm of breast: Secondary | ICD-10-CM

## 2020-10-12 ENCOUNTER — Encounter: Payer: Self-pay | Admitting: Adult Health

## 2020-10-12 ENCOUNTER — Ambulatory Visit: Payer: Medicare PPO | Admitting: Adult Health

## 2020-10-12 ENCOUNTER — Other Ambulatory Visit: Payer: Self-pay

## 2020-10-12 VITALS — BP 132/82 | HR 85 | Temp 98.4°F | Wt 166.2 lb

## 2020-10-12 DIAGNOSIS — M779 Enthesopathy, unspecified: Secondary | ICD-10-CM | POA: Diagnosis not present

## 2020-10-12 MED ORDER — NAPROXEN 500 MG PO TABS: 500.0000 mg | ORAL_TABLET | Freq: Two times a day (BID) | ORAL | 0 refills | Status: DC

## 2020-10-12 NOTE — Progress Notes (Signed)
Subjective:    Patient ID: Erin Hatfield, female    DOB: January 04, 1951, 70 y.o.   MRN: [id number]  HPI 70 year old female who  has a past medical history of Allergy, Bunion, left foot, Complication of anesthesia, Diabetes mellitus without complication ([place]), Family history of breast cancer, Family history of ovarian cancer, Hot flashes, Hypertension, and PONV (postoperative nausea and vomiting).  She presents to the office for an acute issue or left hand/wrist pain and swelling for two weeks.   She has tried over the counter medications   Pain starts at the base of the thumb and radiates up the outside of her arm about half way up her forearm. Pain is no constant and is only with certain range of motion    Review of Systems See HPI   Past Medical History:  Diagnosis Date  . Allergy   . Bunion, left foot   . Complication of anesthesia   . Diabetes mellitus without complication ([place])    type 2  . Family history of breast cancer   . Family history of ovarian cancer   . Hot flashes   . Hypertension   . PONV (postoperative nausea and vomiting)     Social History   Socioeconomic History  . Marital status: Married    Spouse name: Not on file  . Number of children: Not on file  . Years of education: Not on file  . Highest education level: Not on file  Occupational History  . Not on file  Tobacco Use  . Smoking status: Never Smoker  . Smokeless tobacco: Never Used  Vaping Use  . Vaping Use: Never used  Substance and Sexual Activity  . Alcohol use: No  . Drug use: No  . Sexual activity: Not Currently  Other Topics Concern  . 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 Difficulty of Paying Living Expenses: Not hard at all  Food  Insecurity: No Food Insecurity  . Worried About Charity fundraiser in the Last Year: Never true  . Ran Out of Food in the Last Year: Never true  Transportation Needs: No Transportation Needs  . Lack of Transportation (Medical): No  . Lack of Transportation (Non-Medical): No  Physical Activity: Sufficiently Active  . Days of Exercise per Week: 3 days  . Minutes of Exercise per Session: 50 min  Stress: No Stress Concern Present  . Feeling of Stress : Not at all  Social Connections: Moderately Integrated  . Frequency of Communication with Friends and Family: More than three times a week  . Frequency of Social Gatherings with Friends and Family: Once a week  . Attends Religious Services: More than 4 times per year  . Active Member of Clubs or Organizations: No  . Attends Archivist Meetings: Never  . Marital Status: Married  Human resources officer Violence: Not At Risk  . Fear of Current or Ex-Partner: No  . Emotionally Abused: No  . Physically Abused: No  . Sexually Abused: No    Past Surgical History:  Procedure Laterality Date  . LAPAROSCOPIC BILATERAL SALPINGO OOPHERECTOMY Bilateral 10/13/2018   Procedure: LAPAROSCOPIC BILATERAL SALPINGO OOPHORECTOMY;  Surgeon: Megan Salon, MD;  Location: [facility];  Service: Gynecology;  Laterality: Bilateral;  . VAGINAL HYSTERECTOMY  2002   fibroids partial    Family History  Problem Relation Age of Onset  . Diabetes Sister   . Ovarian cancer Sister 62  . Breast cancer Mother 27  . Lung cancer Father        Smoker  . Diabetes Other   . Hypertension Other   . Diabetes Sister   . Breast cancer Niece 28  . Colon cancer Neg Hx     No Known Allergies  Current Outpatient Medications on File Prior to Visit  Medication Sig Dispense Refill  . Accu-Chek Softclix Lancets lancets 1 each by Other route. Use once daily Dx E11.9    . Alcohol Swabs (B-D SINGLE USE SWABS REGULAR) PADS Use to clean finger before checking  blood sugar 2 times daily 100 each 1  . amitriptyline (ELAVIL) 50 MG tablet TAKE 1 TABLET BY MOUTH AT BEDTIME 90 tablet 1  . aspirin 81 MG tablet Take 81 mg by mouth daily.    Marland Kitchen atenolol (TENORMIN) 50 MG tablet Take 1.5 tablets (75 mg total) by mouth daily. 135 tablet 3  . Blood Glucose Calibration (ACCU-CHEK GUIDE CONTROL) LIQD 1 Bottle by In Vitro route as needed. 1 each 1  . Blood Glucose Monitoring Suppl (ACCU-CHEK GUIDE ME) w/Device KIT 2 Sticks by Does not apply route 3 times/day as needed-between meals & bedtime. Used to check blood sugar 2 times daily 1 kit 0  . cetirizine (ZYRTEC) 10 MG tablet Take 10 mg by mouth as needed.    . cholecalciferol (VITAMIN D) 1000 UNITS tablet Take 1,000 Units by mouth daily.    . Chromium-Cinnamon 100-500 MCG-MG CAPS Take by mouth 2 (two) times daily.    . fenofibrate (TRICOR) 145 MG tablet Take 1 tablet (145 mg total) by mouth daily. 90 tablet 3  . fish oil-omega-3 fatty acids 1000 MG capsule Take 1 g by mouth daily.    Marland Kitchen glucose blood (ACCU-CHEK GUIDE) test strip Used to check blood sugar 2 times daily 100 each 12  . lisinopril (ZESTRIL) 10 MG tablet Take 1 tablet (10 mg total) by mouth daily. 90 tablet 3  . metFORMIN (GLUCOPHAGE) 500 MG tablet TAKE 1 TABLET BY MOUTH ONCE DAILY IN THE MORNING AND 2 ONCE DAILY IN THE EVENING 270 tablet 1  . Multiple Vitamin (MULTIVITAMIN) tablet Take 1 tablet by mouth daily.    . simvastatin (ZOCOR) 5 MG tablet Take 1 tablet (5 mg total) by mouth daily. 90 tablet 3   No current facility-administered medications on file prior to visit.    BP 132/82 (BP Location: Right Arm, Patient Position: Sitting, Cuff Size: Normal)   Pulse 85   Temp 98.4 F (36.9 C) (Oral)   Wt 166 lb 3.2 oz (75.4 kg)   LMP 05/14/2000 (Approximate)   SpO2 99%   BMI 28.98 kg/m       Objective:   Physical Exam Vitals and nursing note reviewed.  Constitutional:      Appearance: Normal appearance.  Musculoskeletal:        General:  Tenderness present.     Comments: Tenderness with palpation along radial side of left hand - along extensor tendon. Has pain with extension of wrist. No pain with extension.   Skin:    General: Skin is warm and dry.  Neurological:     Mental Status: She is alert.       Assessment & Plan:  1. Tendonitis -  naproxen (NAPROSYN) 500 MG tablet; Take 1 tablet (500 mg total) by mouth 2 (two) times daily with a meal.  Dispense: 30 tablet; Refill: 0 - Advised compression with ACE bandage, rest and ice throughout the day  - Follow up as needed  [name], NP

## 2020-11-09 ENCOUNTER — Telehealth: Payer: Self-pay | Admitting: Adult Health

## 2020-11-09 NOTE — Telephone Encounter (Signed)
Pt call and stated she is still having problem with her wrist and want a refill on  naproxen (NAPROSYN) 500 MG tablet pt have a appt on 11/23/20 but is out of medication.want it sent to  [facility] ([facility] [place] Phone:  [phone]  Fax:  [phone]

## 2020-11-10 ENCOUNTER — Other Ambulatory Visit: Payer: Self-pay

## 2020-11-10 DIAGNOSIS — M779 Enthesopathy, unspecified: Secondary | ICD-10-CM

## 2020-11-10 MED ORDER — NAPROXEN 500 MG PO TABS: 500.0000 mg | ORAL_TABLET | Freq: Two times a day (BID) | ORAL | 0 refills | Status: DC

## 2020-11-10 NOTE — Telephone Encounter (Signed)
Rx sent to pharmacy. PT aware.

## 2020-11-10 NOTE — Telephone Encounter (Signed)
Per note pt advised to take naprosyn for relief and f/u sooner if needed. Pt stated that she took the naprosyn with some relief but ran out. Pt stated she remembered Tommi Rumps advised to take Advil when she runs out. Pt wants a refill on naprosyn. Ok to send in or should pt do a f/u?

## 2020-11-11 ENCOUNTER — Ambulatory Visit
Admission: RE | Admit: 2020-11-11 | Discharge: 2020-11-11 | Disposition: A | Discharge status: Home / Self Care | Payer: Medicare PPO | Source: Ambulatory Visit | Attending: Adult Health | Admitting: Adult Health

## 2020-11-11 DIAGNOSIS — Z1231 Encounter for screening mammogram for malignant neoplasm of breast: Secondary | ICD-10-CM | POA: Diagnosis not present

## 2020-11-22 ENCOUNTER — Other Ambulatory Visit: Payer: Self-pay

## 2020-11-23 ENCOUNTER — Encounter: Payer: Self-pay | Admitting: Adult Health

## 2020-11-23 ENCOUNTER — Ambulatory Visit: Payer: Medicare PPO | Admitting: Adult Health

## 2020-11-23 VITALS — BP 135/70 | HR 76 | Temp 98.3°F | Ht 63.75 in | Wt 168.0 lb

## 2020-11-23 DIAGNOSIS — M779 Enthesopathy, unspecified: Secondary | ICD-10-CM

## 2020-11-23 DIAGNOSIS — L03011 Cellulitis of right finger: Secondary | ICD-10-CM

## 2020-11-23 DIAGNOSIS — E139 Other specified diabetes mellitus without complications: Secondary | ICD-10-CM | POA: Diagnosis not present

## 2020-11-23 MED ORDER — NAPROXEN 500 MG PO TABS: 500.0000 mg | ORAL_TABLET | Freq: Two times a day (BID) | ORAL | 0 refills | Status: AC

## 2020-11-23 MED ORDER — DOXYCYCLINE HYCLATE 100 MG PO CAPS: 100.0000 mg | ORAL_CAPSULE | Freq: Two times a day (BID) | ORAL | 0 refills | Status: DC

## 2020-11-23 MED ORDER — ACCU-CHEK GUIDE VI STRP: ORAL_STRIP | 12 refills | Status: DC

## 2020-11-23 MED ORDER — ACCU-CHEK SOFTCLIX LANCETS MISC: 1.0000 | Freq: Every day | 0 refills | Status: DC

## 2020-11-23 NOTE — Progress Notes (Signed)
Subjective:    Patient ID: Erin Hatfield, female    DOB: October 21, 1950, 70 y.o.   MRN: [id number]  HPI  70 year old female who  has a past medical history of Allergy, Bunion, left foot, Complication of anesthesia, Diabetes mellitus without complication ([place]), Family history of breast cancer, Family history of ovarian cancer, Hot flashes, Hypertension, and PONV (postoperative nausea and vomiting).  She presents to the office today for follow-up regarding left wrist/hand pain.  She was seen for this originally on 10/12/2020 at which time she reported that her symptoms have been present for 2 weeks.  He had tried various over-the-counter medications without improvement  Pain would start at the base of the thumb and radiate up the outside of her arm about halfway up her forearm.  Her discomfort was only with certain ranges of motion.  At this time it was thought to be tendinosis and she was prescribed a medrol dose pack and naproxen.  She reports that over the last month she has noticed some improvement in her symptoms, to some degree she is about 75 to 80% better, does not have as much swelling or pain but does continue to have discomfort at the base of her thumb to medial aspect of her wrist.  With certain motions it feels as though a "pulling or snapping sensation".  Continues to have pain with palpation to this area as well.  He has noticed that using an Ace bandage helps as well as when she takes naproxen.   Additionally, she reports swelling, redness, and warmth to the distal aspect of her right index finger.  Symptoms have been present for about a week.  She reports that she usually wears gloves while gardening but did not and she was working in some bagged soil.  The next day she noticed that the pad of her right index finger was swollen and slightly painful.  She denies noticing any foreign body such as a splinter.   Review of Systems See HPI   Past Medical History:  Diagnosis Date    Allergy    Bunion, left foot    Complication of anesthesia    Diabetes mellitus without complication ([place])    type 2   Family history of breast cancer    Family history of ovarian cancer    Hot flashes    Hypertension    PONV (postoperative nausea and vomiting)     Social History   Socioeconomic History   Marital status: Married    Spouse name: Not on file   Number of children: Not on file   Years of education: Not on file   Highest education level: Not on file  Occupational History   Not on file  Tobacco Use   Smoking status: Never   Smokeless tobacco: Never  Vaping Use   Vaping Use: Never used  Substance and Sexual Activity   Alcohol use: No   Drug use: No   Sexual activity: Not Currently  Other Topics Concern   Not on file  Social History Narrative   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, environmental   Married for 23 years   One daughter ( [place])    One grandson   Social Determinants of Radio broadcast assistant Strain: Low Risk    Difficulty of Paying Living Expenses: Not hard at [employer] Insecurity: No Food Insecurity   Worried About Charity fundraiser in the Last  Year: Never true   [place] in the Last Year: Never true  Transportation Needs: No Transportation Needs   Lack of Transportation (Medical): No   Lack of Transportation (Non-Medical): No  Physical Activity: Sufficiently Active   Days of Exercise per Week: 3 days   Minutes of Exercise per Session: 50 min  Stress: No Stress Concern Present   Feeling of Stress : Not at all  Social Connections: Moderately Integrated   Frequency of Communication with Friends and Family: More than three times a week   Frequency of Social Gatherings with Friends and Family: Once a week   Attends Religious Services: More than 4 times per year   Active Member of [employer] or Organizations: No   Attends Archivist Meetings: Never   Marital Status:  Married  Human resources officer Violence: Not At Risk   Fear of Current or Ex-Partner: No   Emotionally Abused: No   Physically Abused: No   Sexually Abused: No    Past Surgical History:  Procedure Laterality Date   LAPAROSCOPIC BILATERAL SALPINGO OOPHERECTOMY Bilateral 10/13/2018   Procedure: LAPAROSCOPIC BILATERAL SALPINGO OOPHORECTOMY;  Surgeon: Megan Salon, MD;  Location: [facility];  Service: Gynecology;  Laterality: Bilateral;   VAGINAL HYSTERECTOMY  2002   fibroids partial    Family History  Problem Relation Age of Onset   Diabetes Sister    Ovarian cancer Sister 26   Breast cancer Mother 2   Lung cancer Father        Smoker   Diabetes Other    Hypertension Other    Diabetes Sister    Breast cancer Niece 94   Colon cancer Neg Hx     No Known Allergies  Current Outpatient Medications on File Prior to Visit  Medication Sig Dispense Refill   Alcohol Swabs (B-D SINGLE USE SWABS REGULAR) PADS Use to clean finger before checking blood sugar 2 times daily 100 each 1   amitriptyline (ELAVIL) 50 MG tablet TAKE 1 TABLET BY MOUTH AT BEDTIME 90 tablet 1   aspirin 81 MG tablet Take 81 mg by mouth daily.     atenolol (TENORMIN) 50 MG tablet Take 1.5 tablets (75 mg total) by mouth daily. 135 tablet 3   Blood Glucose Calibration (ACCU-CHEK GUIDE CONTROL) LIQD 1 Bottle by In Vitro route as needed. 1 each 1   Blood Glucose Monitoring Suppl (ACCU-CHEK GUIDE ME) w/Device KIT 2 Sticks by Does not apply route 3 times/day as needed-between meals & bedtime. Used to check blood sugar 2 times daily 1 kit 0   cetirizine (ZYRTEC) 10 MG tablet Take 10 mg by mouth as needed.     cholecalciferol (VITAMIN D) 1000 UNITS tablet Take 1,000 Units by mouth daily.     Chromium-Cinnamon 100-500 MCG-MG CAPS Take by mouth 2 (two) times daily.     fenofibrate (TRICOR) 145 MG tablet Take 1 tablet (145 mg total) by mouth daily. 90 tablet 3   fish oil-omega-3 fatty acids 1000 MG capsule Take 1 g by  mouth daily.     lisinopril (ZESTRIL) 10 MG tablet Take 1 tablet (10 mg total) by mouth daily. 90 tablet 3   metFORMIN (GLUCOPHAGE) 500 MG tablet TAKE 1 TABLET BY MOUTH ONCE DAILY IN THE MORNING AND 2 ONCE DAILY IN THE EVENING 270 tablet 1   Multiple Vitamin (MULTIVITAMIN) tablet Take 1 tablet by mouth daily.     naproxen (NAPROSYN) 500 MG tablet Take 1 tablet (500 mg total) by  mouth 2 (two) times daily with a meal. 30 tablet 0   simvastatin (ZOCOR) 5 MG tablet Take 1 tablet (5 mg total) by mouth daily. 90 tablet 3   No current facility-administered medications on file prior to visit.    BP 135/70   Pulse 76   Temp 98.3 F (36.8 C) (Oral)   Ht 5' 3.75" (1.619 m)   Wt 168 lb (76.2 kg)   LMP 05/14/2000 (Approximate)   SpO2 99%   BMI 29.06 kg/m       Objective:   Physical Exam Vitals and nursing note reviewed.  Constitutional:      Appearance: Normal appearance.  Musculoskeletal:        General: Tenderness present.     Comments: Continues to have tenderness with palpation along radial aspect of left hand, along her extensor tendon.  No swelling noted.   Skin:    General: Skin is warm and dry.     Findings: Erythema present.     Comments: Swelling, redness, warmth, and pain with palpation to the distal pad of her right index finger.  No foreign body noted  Neurological:     General: No focal deficit present.     Mental Status: She is alert and oriented to person, place, and time.  Psychiatric:        Mood and Affect: Mood normal.        Behavior: Behavior normal.        Thought Content: Thought content normal.        Judgment: Judgment normal.      Assessment & Plan:  1. Tendonitis -Likely still tendinitis but will refer to sports medicine for further evaluation.  She can continue with conservative measures such as Ace bandage, ice, rest, naproxen - naproxen (NAPROSYN) 500 MG tablet; Take 1 tablet (500 mg total) by mouth 2 (two) times daily with a meal.  Dispense: 60  tablet; Refill: 0 - Ambulatory referral to Sports Medicine  2. Cellulitis of finger of right hand  - doxycycline (VIBRAMYCIN) 100 MG capsule; Take 1 capsule (100 mg total) by mouth 2 (two) times daily.  Dispense: 14 capsule; Refill: 0 -Follow-up if no resolved by the end of antibiotic therapy or sooner if symptoms worsen or she notices streaking. 3. Diabetes 1.5, managed as type 2 ([place])  - glucose blood (ACCU-CHEK GUIDE) test strip; Used to check blood sugar 2 times daily  Dispense: 100 each; Refill: 12 - Accu-Chek Softclix Lancets lancets; 1 each by Other route daily at 2 PM. Use once daily Dx E11.9  Dispense: 100 each; Refill: 0  [name], NP

## 2020-12-08 NOTE — Progress Notes (Deleted)
   I, [name], LAT, ATC acting as a scribe for Erin Leader, MD.  Subjective:    I'm seeing this patient as a consultation for: Erin Peng, NP. Note will be routed back to referring provider/PCP.  CC: Left wrist/hand pain  HPI: Pt is a 70 y/o female c/o L wrist/hand pain ongoing since mid-May. Pt locates pain to the base of her 1st MC /.   Grip strength: Numbness/tingling: Aggravates: Treatments tried: naproxen, ace wrap, medrol dose back, ice   Past medical history, Surgical history, Family history, Social history, Allergies, and medications have been entered into the medical record, reviewed. ***  Review of Systems: No new headache, visual changes, nausea, vomiting, diarrhea, constipation, dizziness, abdominal pain, skin rash, fevers, chills, night sweats, weight loss, swollen lymph nodes, body aches, joint swelling, muscle aches, chest pain, shortness of breath, mood changes, visual or auditory hallucinations.   Objective:   There were no vitals filed for this visit. General: Well Developed, well nourished, and in no acute distress.  Neuro/Psych: Alert and oriented x3, extra-ocular muscles intact, able to move all 4 extremities, sensation grossly intact. Skin: Warm and dry, no rashes noted.  Respiratory: Not using accessory muscles, speaking in full sentences, trachea midline.  Cardiovascular: Pulses palpable, no extremity edema. Abdomen: Does not appear distended. MSK: ***  Lab and Radiology Results No results found for this or any previous visit (from the past 72 hour(s)). No results found.  Impression and Recommendations:    Assessment and Plan: 70 y.o. female with ***.  PDMP not reviewed this encounter. No orders of the defined types were placed in this encounter.  No orders of the defined types were placed in this encounter.   Discussed warning signs or symptoms. Please see discharge instructions. Patient expresses understanding.   ***

## 2020-12-12 ENCOUNTER — Ambulatory Visit: Payer: Medicare PPO | Admitting: Family Medicine

## 2020-12-22 ENCOUNTER — Ambulatory Visit: Payer: Medicare PPO

## 2020-12-23 NOTE — Progress Notes (Signed)
    Subjective:    CC: L wrist and hand pain  I, Molly Weber, LAT, ATC, am serving as scribe for Dr. [name].  HPI: Pt is a 70 y/o female presenting w/ c/o pain at base of L thumb radiating up her lateral forearm since late May 2022 w/ no known MOI.  She locates her pain to her L radial wrist and thumb.  Radiating pain: yes into the L forearm Swelling: yes Aggravating factors: L thumb extension; holding heavier items in her L hand; L wrist UD Treatments tried: medrol dose pack; naproxen; Tylenol; Aleve; wrist/thumb wrap  Pertinent review of Systems: No fevers or chills  Relevant historical information: Diabetes with neuropathy   Objective:    Vitals:   12/26/20 0752  BP: (!) 150/82  Pulse: 80  SpO2: 98%   General: Well Developed, well nourished, and in no acute distress.   MSK: Left wrist slight swelling at radial styloid.  Tender palpation overlying radial styloid.  Normal wrist motion. Intact strength. Positive Finkelstein's test.  Lab and Radiology Results  Diagnostic Limited MSK Ultrasound of: Left dorsal wrist First dorsal wrist compartment with intact tendons.  Hypoechoic fluid surrounds tendon and tendon sheath consistent with de Quervain's tenosynovitis. Normal-appearing dorsal wrist otherwise. Impression: De Quervain's tenosynovitis  X-ray images left wrist obtained today personally and independently interpreted Tiny rounded avulsion fragment overlying scaphoid/distal radius consistent with old avulsion.  This is seen on the AP view and scaphoid view.  No definitive scaphoid fracture present.  Mild first CMC DJD. Await formal radiology review   Impression and Recommendations:    Assessment and Plan: 70 y.o. female with left wrist pain due to de Quervain's tenosynovitis.  Plan for referral to hand physical therapy, Voltaren gel, and thumb spica splint.  Recheck in 6 weeks.  If not better consider injection versus MRI.  Would like to avoid injection  given diabetes history.  Recheck sooner or contact me sooner if not doing well.Marland Kitchen  PDMP not reviewed this encounter. Orders Placed This Encounter  Procedures   [place] LIMITED JOINT SPACE STRUCTURES UP LEFT(NO LINKED CHARGES)    Order Specific Question:   Reason for Exam (SYMPTOM  OR DIAGNOSIS REQUIRED)    Answer:   L wrist pain    Order Specific Question:   Preferred imaging location?    Answer:   [facility]   DG Wrist Complete Left    Standing Status:   Future    Standing Expiration Date:   12/26/2021    Order Specific Question:   Reason for Exam (SYMPTOM  OR DIAGNOSIS REQUIRED)    Answer:   evl left wrist pain    Order Specific Question:   Preferred imaging location?    Answer:   [facility]   Ambulatory referral to Physical Therapy    Referral Priority:   Routine    Referral Type:   Physical Medicine    Referral Reason:   Specialty Services Required    Requested Specialty:   Physical Therapy    Number of Visits Requested:   1   No orders of the defined types were placed in this encounter.   Discussed warning signs or symptoms. Please see discharge instructions. Patient expresses understanding.   The above documentation has been reviewed and is accurate and complete [name], M.D.

## 2020-12-26 ENCOUNTER — Ambulatory Visit: Payer: Self-pay

## 2020-12-26 ENCOUNTER — Ambulatory Visit: Payer: Medicare PPO | Admitting: Family Medicine

## 2020-12-26 ENCOUNTER — Ambulatory Visit (INDEPENDENT_AMBULATORY_CARE_PROVIDER_SITE_OTHER): Payer: Medicare PPO

## 2020-12-26 ENCOUNTER — Other Ambulatory Visit: Payer: Self-pay

## 2020-12-26 ENCOUNTER — Encounter: Payer: Self-pay | Admitting: Family Medicine

## 2020-12-26 ENCOUNTER — Telehealth: Payer: Self-pay | Admitting: Family Medicine

## 2020-12-26 VITALS — BP 150/82 | HR 80 | Ht 63.75 in | Wt 169.0 lb

## 2020-12-26 DIAGNOSIS — M25532 Pain in left wrist: Secondary | ICD-10-CM | POA: Diagnosis not present

## 2020-12-26 DIAGNOSIS — M79645 Pain in left finger(s): Secondary | ICD-10-CM | POA: Diagnosis not present

## 2020-12-26 DIAGNOSIS — M654 Radial styloid tenosynovitis [de Quervain]: Secondary | ICD-10-CM

## 2020-12-26 NOTE — Progress Notes (Signed)
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  No fractures are visible.

## 2020-12-26 NOTE — Patient Instructions (Signed)
Thank you for coming in today.   I've referred you to Physical Therapy.  Let us know if you don't hear from them in one week.   Please use Voltaren gel (Generic Diclofenac Gel) up to 4x daily for pain as needed.  This is available over-the-counter as both the name brand Voltaren gel and the generic diclofenac gel.   Please get an Xray today before you leave   Use a thumb spica wrist brace.   Please go to [facility] supply to get the thumb spica we talked about today. You may also be able to get it from [facility].    Recheck in 6 weeks.   Let me know if this is not working.   De Quervain's Tenosynovitis  De Quervain's tenosynovitis is a condition that causes inflammation of the tendon on the thumb side of the wrist. Tendons are cords of tissue that connect bones to muscles. The tendons in the hand pass through a tunnel called a sheath. A slippery layer of tissue (synovium) lets the tendons move smoothly in the sheath. With de Quervain'stenosynovitis, the sheath swells or thickens, causing friction and pain. The condition is also called de Quervain's disease and de Quervain's syndrome.It occurs most often in women who are 40-79 years old. What are the causes? The exact cause of this condition is not known. It may be associated withoveruse of the hand and wrist. What increases the risk? You are more likely to develop this condition if you: Use your hands far more than normal, especially if you repeat certain movements that involve twisting your hand or using a tight grip. Are pregnant. Are a middle-aged woman. Have rheumatoid arthritis. Have diabetes. What are the signs or symptoms? The main symptom of this condition is pain on the thumb side of the wrist. The pain may get worse when you grasp something or turn your wrist. Other symptoms may include: Pain that extends up the forearm. Swelling of your wrist and hand. Trouble moving the thumb and wrist. A sensation of snapping in the  wrist. A bump filled with fluid (cyst) in the area of the pain. How is this diagnosed? This condition may be diagnosed based on: Your symptoms and medical history. A physical exam. During the exam, your health care provider may do a simple test Wynn Maudlin test) that involves pulling your thumb and wrist to see if this causes pain. You may also need to have an X-ray or ultrasound. How is this treated? Treatment for this condition may include: Avoiding any activity that causes pain and swelling. Taking medicines. Anti-inflammatory medicines and corticosteroid injections may be used to reduce inflammation and relieve pain. Wearing a splint. Having surgery. This may be needed if other treatments do not work. Once the pain and swelling have gone down, you may start: Physical therapy. This includes exercises to improve movement and strength in your wrist and thumb. Occupational therapy. This includes adjusting how you move your wrist. Follow these instructions at home: If you have a splint: Wear the splint as told by your health care provider. Remove it only as told by your health care provider. Loosen the splint if your fingers tingle, become numb, or turn cold and blue. Keep the splint clean. If the splint is not waterproof: Do not let it get wet. Cover it with a watertight covering when you take a bath or a shower. Managing pain, stiffness, and swelling  Avoid movements and activities that cause pain and swelling in the wrist area. If  directed, put ice on the painful area. This may be helpful after doing activities that involve the sore wrist. To do this: Put ice in a plastic bag. Place a towel between your skin and the bag. Leave the ice on for 20 minutes, 2-3 times a day. Remove the ice if your skin turns bright red. This is very important. If you cannot feel pain, heat, or cold, you have a greater risk of damage to the area. Move your fingers often to reduce stiffness and  swelling. Raise (elevate) the injured area above the level of your heart while you are sitting or lying down.  General instructions Return to your normal activities as told by your health care provider. Ask your health care provider what activities are safe for you. Take over-the-counter and prescription medicines only as told by your health care provider. Keep all follow-up visits. This is important. Contact a health care provider if: Your pain medicine does not help. Your pain gets worse. You develop new symptoms. Summary De Quervain's tenosynovitis is a condition that causes inflammation of the tendon on the thumb side of the wrist. The condition occurs most often in women who are 32-39 years old. The exact cause of this condition is not known. It may be associated with overuse of the hand and wrist. Treatment starts with avoiding activity that causes pain or swelling in the wrist area. Other treatments may include wearing a splint and taking medicine. Sometimes, surgery is needed. This information is not intended to replace advice given to you by your health care provider. Make sure you discuss any questions you have with your healthcare provider. Document Revised: 08/12/2019 Document Reviewed: 08/12/2019 Elsevier Patient Education  2022 [employer].

## 2020-12-27 ENCOUNTER — Encounter: Payer: Self-pay | Admitting: Adult Health

## 2020-12-27 ENCOUNTER — Ambulatory Visit: Payer: Medicare PPO | Admitting: Adult Health

## 2020-12-27 VITALS — BP 140/77 | HR 95 | Temp 98.5°F | Ht 63.75 in | Wt 169.0 lb

## 2020-12-27 DIAGNOSIS — I10 Essential (primary) hypertension: Secondary | ICD-10-CM | POA: Diagnosis not present

## 2020-12-27 DIAGNOSIS — E139 Other specified diabetes mellitus without complications: Secondary | ICD-10-CM

## 2020-12-27 LAB — POCT GLYCOSYLATED HEMOGLOBIN (HGB A1C): Hemoglobin A1C: 6.8 % — AB (ref 4.0–5.6)

## 2020-12-27 MED ORDER — METFORMIN HCL 500 MG PO TABS: ORAL_TABLET | ORAL | 1 refills | Status: DC

## 2020-12-27 NOTE — Progress Notes (Signed)
Subjective:    Patient ID: Erin Hatfield, female    DOB: April 27, 1951, 70 y.o.   MRN: [id number]  HPI  70 year old female who  has a past medical history of Allergy, Bunion, left foot, Complication of anesthesia, Diabetes mellitus without complication ([place]), Family history of breast cancer, Family history of ovarian cancer, Hot flashes, Hypertension, and PONV (postoperative nausea and vomiting).  DM -prescribed metformin 500 mg in the morning and 1000 mg in the evening.  She reports that her blood sugars have been in the 120-140's. Her last A1c was 7.1 - 6 months ago    Essential Hypertension -currently prescribed atenolol 75 mg daily and lisinopril 10 mg daily.  She denies dizziness, lightheadedness, chest pain, shortness of breath.  She does monitor her blood pressure at home with readings consistently in the 130s to 140s over 70s to 80s.  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 2   Family history of breast cancer    Family history of ovarian cancer    Hot flashes    Hypertension    PONV (postoperative nausea and vomiting)     Social History   Socioeconomic History   Marital status: Married    Spouse name: Not on file   Number of children: Not on file   Years of education: Not on file   Highest education level: Not on file  Occupational History   Not on file  Tobacco Use   Smoking status: Never   Smokeless tobacco: Never  Vaping Use   Vaping Use: Never used  Substance and Sexual Activity   Alcohol use: No   Drug use: No   Sexual activity: Not Currently  Other Topics Concern   Not on file  Social History Narrative   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, environmental   Married for 1 years   One daughter ( [place])    One grandson   Social Determinants of Health   Financial Resource Strain: Not on file  Food Insecurity: Not on file  Transportation Needs: Not on file  Physical Activity: Not on file  Stress: Not on file  Social Connections: Not on file  Intimate Partner Violence: Not on file    Past Surgical History:  Procedure Laterality Date   LAPAROSCOPIC BILATERAL SALPINGO OOPHERECTOMY Bilateral 10/13/2018   Procedure: [place];  Surgeon: Megan Salon, MD;  Location: [facility];  Service: Gynecology;  Laterality: Bilateral;   VAGINAL HYSTERECTOMY  2002   fibroids partial    Family History  Problem Relation Age of Onset   Diabetes Sister    Ovarian cancer Sister 11   Breast cancer Mother 48   Lung cancer Father        Smoker   Diabetes Other    Hypertension Other    Diabetes Sister    Breast cancer Niece 69   Colon cancer Neg Hx     No Known Allergies  Current Outpatient Medications on File Prior to Visit  Medication Sig Dispense Refill   Accu-Chek Softclix Lancets lancets 1 each by Other  route daily at 2 PM. Use once daily Dx E11.9 100 each 0   Alcohol Swabs (B-D SINGLE USE SWABS REGULAR) PADS Use to clean finger before checking blood sugar 2 times daily 100 each 1   amitriptyline (ELAVIL) 50 MG tablet TAKE 1 TABLET BY MOUTH AT BEDTIME 90 tablet 1   aspirin 81 MG tablet Take 81 mg by mouth daily.     Blood Glucose Calibration (ACCU-CHEK GUIDE CONTROL) LIQD 1 Bottle by In Vitro route as needed. 1 each 1   Blood Glucose Monitoring Suppl (ACCU-CHEK GUIDE ME) w/Device KIT 2 Sticks by Does not apply route 3 times/day as needed-between meals & bedtime. Used to check blood sugar 2 times daily 1 kit 0   cetirizine (ZYRTEC) 10 MG tablet Take 10 mg by mouth as needed.     cholecalciferol (VITAMIN D) 1000 UNITS tablet Take  1,000 Units by mouth daily.     Chromium-Cinnamon 100-500 MCG-MG CAPS Take by mouth 2 (two) times daily.     fenofibrate (TRICOR) 145 MG tablet Take 1 tablet (145 mg total) by mouth daily. 90 tablet 3   fish oil-omega-3 fatty acids 1000 MG capsule Take 1 g by mouth daily.     glucose blood (ACCU-CHEK GUIDE) test strip Used to check blood sugar 2 times daily 100 each 12   lisinopril (ZESTRIL) 10 MG tablet Take 1 tablet (10 mg total) by mouth daily. 90 tablet 3   metFORMIN (GLUCOPHAGE) 500 MG tablet TAKE 1 TABLET BY MOUTH ONCE DAILY IN THE MORNING AND 2 ONCE DAILY IN THE EVENING 270 tablet 1   Multiple Vitamin (MULTIVITAMIN) tablet Take 1 tablet by mouth daily.     atenolol (TENORMIN) 50 MG tablet Take 1.5 tablets (75 mg total) by mouth daily. 135 tablet 3   simvastatin (ZOCOR) 5 MG tablet Take 1 tablet (5 mg total) by mouth daily. 90 tablet 3   No current facility-administered medications on file prior to visit.    Pulse 95   Temp 98.5 F (36.9 C) (Oral)   Ht 5' 3.75" (1.619 m)   Wt 169 lb (76.7 kg)   LMP 05/14/2000 (Approximate)   SpO2 95%   BMI 29.24 kg/m       Objective:   Physical Exam Vitals and nursing note reviewed.  Constitutional:      Appearance: Normal appearance.  Cardiovascular:     Rate and Rhythm: Normal rate and regular rhythm.     Pulses: Normal pulses.     Heart sounds: Normal heart sounds.  Pulmonary:     Effort: Pulmonary effort is normal.     Breath sounds: Normal breath sounds.  Musculoskeletal:        General: Normal range of motion.  Skin:    General: Skin is warm and dry.  Neurological:     General: No focal deficit present.     Mental Status: She is alert and oriented to person, place, and time.  Psychiatric:        Mood and Affect: Mood normal.        Behavior: Behavior normal.        Thought Content: Thought content normal.        Judgment: Judgment normal.      Assessment & Plan:  1. Diabetes 1.5, managed as type 2 ([place])  - POC HgB  A1c- 6.8 - at goal  - No change in medications  - Follow up in 6 months for CPE  - metFORMIN (GLUCOPHAGE) 500 MG tablet;  TAKE 1 TABLET BY MOUTH ONCE DAILY IN THE MORNING AND 2 ONCE DAILY IN THE EVENING  Dispense: 270 tablet; Refill: 1  2. Essential hypertension - No change in medications   [name], NP

## 2020-12-29 DIAGNOSIS — H40033 Anatomical narrow angle, bilateral: Secondary | ICD-10-CM | POA: Diagnosis not present

## 2020-12-29 DIAGNOSIS — E119 Type 2 diabetes mellitus without complications: Secondary | ICD-10-CM | POA: Diagnosis not present

## 2021-01-10 ENCOUNTER — Encounter: Payer: Self-pay | Admitting: Adult Health

## 2021-01-10 ENCOUNTER — Telehealth (INDEPENDENT_AMBULATORY_CARE_PROVIDER_SITE_OTHER): Payer: Medicare PPO | Admitting: Adult Health

## 2021-01-10 VITALS — BP 169/83 | Ht 63.75 in | Wt 169.0 lb

## 2021-01-10 DIAGNOSIS — J988 Other specified respiratory disorders: Secondary | ICD-10-CM | POA: Diagnosis not present

## 2021-01-10 MED ORDER — DOXYCYCLINE HYCLATE 100 MG PO CAPS: 100.0000 mg | ORAL_CAPSULE | Freq: Two times a day (BID) | ORAL | 0 refills | Status: DC

## 2021-01-10 MED ORDER — ALBUTEROL SULFATE HFA 108 (90 BASE) MCG/ACT IN AERS: 2.0000 | INHALATION_SPRAY | Freq: Four times a day (QID) | RESPIRATORY_TRACT | 0 refills | Status: DC | PRN

## 2021-01-10 NOTE — Progress Notes (Signed)
Virtual Visit via Telephone Note  I connected with Erin Hatfield on 01/10/21 at 11:00 AM EDT by telephone and verified that I am speaking with the correct person using two identifiers.   I discussed the limitations, risks, security and privacy concerns of performing an evaluation and management service by telephone and the availability of in person appointments. I also discussed with the patient that there may be a patient responsible charge related to this service. The patient expressed understanding and agreed to proceed.  Location patient: home Location provider: work or home office Participants present for the call: patient, provider Patient did not have a visit in the prior 7 days to address this/these issue(s).   History of Present Illness: 70 year old female who is being evaluated today for an acute issue.  Symptoms started about 3 days ago.  Symptoms include shortness of breath with exertion, wheezing, chest congestion, and a dry cough.  She also reports that since she has been feeling bad her blood sugars have been elevated into the 191s - 200's.   Covid test done this morning was negative    Observations/Objective: Patient sounds cheerful and well on the phone. I do not appreciate any SOB. Dry cough present Speech and thought processing are grossly intact. Patient reported vitals:  Assessment and Plan: 1. Respiratory tract infection -We will treat for respiratory tract infection due to symptoms.  Advise follow-up in the office in 2 to 3 days if no improvement - doxycycline (VIBRAMYCIN) 100 MG capsule; Take 1 capsule (100 mg total) by mouth 2 (two) times daily.  Dispense: 14 capsule; Refill: 0 - albuterol (VENTOLIN HFA) 108 (90 Base) MCG/ACT inhaler; Inhale 2 puffs into the lungs every 6 (six) hours as needed for wheezing or shortness of breath.  Dispense: 8 g; Refill: 0   Follow Up Instructions:  DIAGMED@   [id number] 5-10 [id number] 11-20 [id number] 21-30 I did not refer this  patient for an OV in the next 24 hours for this/these issue(s).  I discussed the assessment and treatment plan with the patient. The patient was provided an opportunity to ask questions and all were answered. The patient agreed with the plan and demonstrated an understanding of the instructions.   The patient was advised to call back or seek an in-person evaluation if the symptoms worsen or if the condition fails to improve as anticipated.  I provided 30 minutes of non-face-to-face time during this encounter.   [name], NP

## 2021-01-23 ENCOUNTER — Telehealth: Payer: Self-pay | Admitting: Adult Health

## 2021-01-23 DIAGNOSIS — M654 Radial styloid tenosynovitis [de Quervain]: Secondary | ICD-10-CM | POA: Diagnosis not present

## 2021-01-23 DIAGNOSIS — M25532 Pain in left wrist: Secondary | ICD-10-CM | POA: Diagnosis not present

## 2021-01-23 DIAGNOSIS — M79645 Pain in left finger(s): Secondary | ICD-10-CM | POA: Diagnosis not present

## 2021-01-23 NOTE — Telephone Encounter (Signed)
Left message for patient to call back and schedule Medicare Annual Wellness Visit (AWV) either virtually or in office. Left  my Erin Hatfield number [phone]   Last AWV ;12/22/19  please schedule at anytime with [facility] Nurse Health Advisor 1 or 2   This should be a 45 minute visit.

## 2021-02-01 DIAGNOSIS — M79645 Pain in left finger(s): Secondary | ICD-10-CM | POA: Diagnosis not present

## 2021-02-01 DIAGNOSIS — M654 Radial styloid tenosynovitis [de Quervain]: Secondary | ICD-10-CM | POA: Diagnosis not present

## 2021-02-01 DIAGNOSIS — M25532 Pain in left wrist: Secondary | ICD-10-CM | POA: Diagnosis not present

## 2021-02-02 ENCOUNTER — Ambulatory Visit (INDEPENDENT_AMBULATORY_CARE_PROVIDER_SITE_OTHER): Payer: Medicare PPO

## 2021-02-02 DIAGNOSIS — Z0000 Encounter for general adult medical examination without abnormal findings: Secondary | ICD-10-CM

## 2021-02-02 NOTE — Progress Notes (Signed)
Subjective:   Erin Hatfield is a 70 y.o. female who presents for Medicare Annual (Subsequent) preventive examination.   I connected with Chakira Jachim today by telephone and verified that I am speaking with the correct person using two identifiers. Location patient: home Location provider: work Persons participating in the virtual visit: patient, provider.   I discussed the limitations, risks, security and privacy concerns of performing an evaluation and management service by telephone and the availability of in person appointments. I also discussed with the patient that there may be a patient responsible charge related to this service. The patient expressed understanding and verbally consented to this telephonic visit.    Interactive audio and video telecommunications were attempted between this provider and patient, however failed, due to patient having technical difficulties OR patient did not have access to video capability.  We continued and completed visit with audio only.    Review of Systems    N/A       Objective:    There were no vitals filed for this visit. There is no height or weight on file to calculate BMI.  Advanced Directives 12/22/2019 10/13/2018  Does Patient Have a Medical Advance Directive? No No  Would patient like information on creating a medical advance directive? No - Patient declined No - Patient declined    Current Medications (verified) Outpatient Encounter Medications as of 02/02/2021  Medication Sig   Accu-Chek Softclix Lancets lancets 1 each by Other route daily at 2 PM. Use once daily Dx E11.9   albuterol (VENTOLIN HFA) 108 (90 Base) MCG/ACT inhaler Inhale 2 puffs into the lungs every 6 (six) hours as needed for wheezing or shortness of breath.   Alcohol Swabs (B-D SINGLE USE SWABS REGULAR) PADS Use to clean finger before checking blood sugar 2 times daily   amitriptyline (ELAVIL) 50 MG tablet TAKE 1 TABLET BY MOUTH AT BEDTIME   aspirin 81 MG  tablet Take 81 mg by mouth daily.   atenolol (TENORMIN) 50 MG tablet Take 1.5 tablets (75 mg total) by mouth daily.   Blood Glucose Calibration (ACCU-CHEK GUIDE CONTROL) LIQD 1 Bottle by In Vitro route as needed.   Blood Glucose Monitoring Suppl (ACCU-CHEK GUIDE ME) w/Device KIT 2 Sticks by Does not apply route 3 times/day as needed-between meals & bedtime. Used to check blood sugar 2 times daily   cetirizine (ZYRTEC) 10 MG tablet Take 10 mg by mouth as needed.   cholecalciferol (VITAMIN D) 1000 UNITS tablet Take 1,000 Units by mouth daily.   Chromium-Cinnamon 100-500 MCG-MG CAPS Take by mouth 2 (two) times daily.   doxycycline (VIBRAMYCIN) 100 MG capsule Take 1 capsule (100 mg total) by mouth 2 (two) times daily.   fenofibrate (TRICOR) 145 MG tablet Take 1 tablet (145 mg total) by mouth daily.   fish oil-omega-3 fatty acids 1000 MG capsule Take 1 g by mouth daily.   glucose blood (ACCU-CHEK GUIDE) test strip Used to check blood sugar 2 times daily   lisinopril (ZESTRIL) 10 MG tablet Take 1 tablet (10 mg total) by mouth daily.   metFORMIN (GLUCOPHAGE) 500 MG tablet TAKE 1 TABLET BY MOUTH ONCE DAILY IN THE MORNING AND 2 ONCE DAILY IN THE EVENING   Multiple Vitamin (MULTIVITAMIN) tablet Take 1 tablet by mouth daily.   simvastatin (ZOCOR) 5 MG tablet Take 1 tablet (5 mg total) by mouth daily.   No facility-administered encounter medications on file as of 02/02/2021.    Allergies (verified) Patient has no known allergies.  History: Past Medical History:  Diagnosis Date   Allergy    Bunion, left foot    Complication of anesthesia    Diabetes mellitus without complication ([place])    type 2   Family history of breast cancer    Family history of ovarian cancer    Hot flashes    Hypertension    PONV (postoperative nausea and vomiting)    Past Surgical History:  Procedure Laterality Date   LAPAROSCOPIC BILATERAL SALPINGO OOPHERECTOMY Bilateral 10/13/2018   Procedure: LAPAROSCOPIC BILATERAL  SALPINGO OOPHORECTOMY;  Surgeon: Megan Salon, MD;  Location: [facility];  Service: Gynecology;  Laterality: Bilateral;   VAGINAL HYSTERECTOMY  2002   fibroids partial   Family History  Problem Relation Age of Onset   Diabetes Sister    Ovarian cancer Sister 63   Breast cancer Mother 72   Lung cancer Father        Smoker   Diabetes Other    Hypertension Other    Diabetes Sister    Breast cancer Niece 72   Colon cancer Neg Hx    Social History   Socioeconomic History   Marital status: Married    Spouse name: Not on file   Number of children: Not on file   Years of education: Not on file   Highest education level: Not on file  Occupational History   Not on file  Tobacco Use   Smoking status: Never   Smokeless tobacco: Never  Vaping Use   Vaping Use: Never used  Substance and Sexual Activity   Alcohol use: No   Drug use: No   Sexual activity: Not Currently  Other Topics Concern   Not on file  Social History Narrative   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  No  Does the patient have any non-healing wounds?  No  Has the patient had any unintentional weight loss or weight gain?  No   Diabetes:  Is the patient diabetic?  Yes  If diabetic, was a CBG obtained today?  No  Did the patient bring in their glucometer from home?  No  How often do you monitor your CBG's? daily.   Financial Strains and  Diabetes Management:  Are you having any financial strains with the device, your supplies or your medication? No .  Does the patient want to be seen by Chronic Care Management for management of their diabetes?  No  Would the patient like to be referred to a Nutritionist or for Diabetic Management?  No   Diabetic Exams:  Diabetic Eye Exam: Completed 12/2020 Diabetic Foot Exam: Overdue, Pt has been advised about the importance in completing this exam. Pt is scheduled for diabetic foot exam on next office visit .       Activities of Daily Living In your present state  of health, do you have any difficulty performing the following activities: 12/27/2020  Hearing? N  Vision? N  Difficulty concentrating or making decisions? N  Walking or climbing stairs? N  Dressing or bathing? N  Doing errands, shopping? N  Some recent data might be hidden    Patient Care Team: [name], NP as PCP - General (Family Medicine)  Indicate any recent Medical Services you may have received from other than [facility] providers in the past year (date may be approximate).     Assessment:   This is a routine wellness examination for [place].  Hearing/Vision screen No results found.  Dietary issues and exercise activities discussed:     Goals Addressed   None    Depression Screen PHQ 2/9 Scores 06/28/2020 12/22/2019 06/05/2018 05/02/2017 04/26/2015 01/06/2014  PHQ - 2 Score 0 0 0 0 0 0  PHQ- 9 Score - 0 - - - -    Fall Risk Fall Risk  06/28/2020 12/22/2019 06/05/2018 05/02/2017 04/26/2015  Falls in the past year? 0 - 0 No No  Number falls in past yr: - 0 - - -  Injury with Fall? - 0 - - -  Risk for fall due to : - Medication side effect - - -    FALL RISK PREVENTION PERTAINING TO THE HOME:  Any stairs in or around the home? Yes  If so, are there any without handrails? No  Home free of loose throw rugs in walkways, pet beds, electrical cords, etc? Yes  Adequate lighting in your home to reduce risk  of falls? Yes   ASSISTIVE DEVICES UTILIZED TO PREVENT FALLS:  Life alert? No  Use of a cane, walker or w/c? No  Grab bars in the bathroom? No  Shower chair or bench in shower? No  Elevated toilet seat or a handicapped toilet? No    Cognitive Function:  Normal cognitive status assessed by direct observation by this Nurse Health Advisor. No abnormalities found.     6CIT Screen 12/22/2019  What Year? 0 points  What month? 0 points  What time? 0 points  Count back from 20 0 points  Months in reverse 0 points  Repeat phrase 0 points  Total Score 0    Immunizations Immunization History  Administered Date(s) Administered   Fluad Quad(high Dose 65+) 02/07/2019, 05/11/2020   Influenza Split 02/13/2011, 03/24/2012   Influenza Whole 02/25/2008, 02/02/2009   Influenza, High Dose Seasonal PF 04/26/2015, 05/01/2016, 05/02/2017, 03/20/2018   Influenza,inj,Quad PF,6+ Mos 04/16/2013, 01/06/2014   Moderna SARS-COV2 Booster Vaccination 07/24/2019   Moderna Sars-Covid-2 Vaccination 06/19/2019, 07/17/2019   Pneumococcal Conjugate-13 04/20/2014   Pneumococcal Polysaccharide-23 04/26/2015   Td 12/11/2005   Zoster, Live 04/11/2011    TDAP status: Due, Education has been provided regarding the importance of this vaccine. Advised may receive this vaccine at local pharmacy or Health Dept. Aware to provide a copy of the vaccination record if obtained from local pharmacy or Health Dept. Verbalized acceptance and understanding.  Flu Vaccine status: Up to date  Pneumococcal vaccine status: Up to date  Covid-19 vaccine status: Completed vaccines  Qualifies for Shingles Vaccine? Yes   Zostavax completed No   Shingrix Completed?: No.    Education has been provided regarding the importance of this vaccine. Patient has been advised to call insurance company to determine out of pocket expense if they have not yet received this vaccine. Advised may also receive vaccine at local pharmacy or Health Dept.  Verbalized acceptance and understanding.  Screening  Tests Health Maintenance  Topic Date Due   Zoster Vaccines- Shingrix (1 of 2) Never done   COVID-19 Vaccine (4 - Booster for Moderna series) 11/23/2019   INFLUENZA VACCINE  12/12/2020   OPHTHALMOLOGY EXAM  12/12/2020   TETANUS/TDAP  10/12/2021 (Originally 12/12/2015)   HEMOGLOBIN A1C  06/29/2021   MAMMOGRAM  11/11/2021   FOOT EXAM  12/27/2021   COLONOSCOPY (Pts 45-51yr Insurance coverage will need to be confirmed)  02/28/2025   DEXA SCAN  Completed   Hepatitis C Screening  Completed   HPV VACCINES  Aged Out    Health Maintenance  Health Maintenance Due  Topic Date Due   Zoster Vaccines- Shingrix (1 of 2) Never done   COVID-19 Vaccine (4 - Booster for Moderna series) 11/23/2019   INFLUENZA VACCINE  12/12/2020   OPHTHALMOLOGY EXAM  12/12/2020    Colorectal cancer screening: Type of screening: Colonoscopy. Completed 03/01/2015. Repeat every 10 years  Mammogram status: Completed 11/11/2020. Repeat every year  Bone Density status: Completed 04/11/2020. Results reflect: Bone density results: OSTEOPENIA. Repeat every 5 years.  Lung Cancer Screening: (Low Dose CT Chest recommended if Age 72-80years, 30 pack-year currently smoking OR have quit w/in 15years.) does not qualify.   Lung Cancer Screening Referral: n/a  Additional Screening:  Hepatitis C Screening: does not qualify; Completed 04/11/2020  Vision Screening: Recommended annual ophthalmology exams for early detection of glaucoma and other disorders of the eye. Is the patient up to date with their annual eye exam?  Yes  Who is the provider or what is the name of the office in which the patient attends annual eye exams? Dr.Lee If pt is not established with a provider, would they like to be referred to a provider to establish care? No .   Dental Screening: Recommended annual dental exams for proper oral hygiene  Community Resource Referral / Chronic Care  Management: CRR required this visit?  No   CCM required this visit?  No      Plan:     I have personally reviewed and noted the following in the patient's chart:   Medical and social history Use of alcohol, tobacco or illicit drugs  Current medications and supplements including opioid prescriptions.  Functional ability and status Nutritional status Physical activity Advanced directives List of other physicians Hospitalizations, surgeries, and ER visits in previous 12 months Vitals Screenings to include cognitive, depression, and falls Referrals and appointments  In addition, I have reviewed and discussed with patient certain preventive protocols, quality metrics, and best practice recommendations. A written personalized care plan for preventive services as well as general preventive health recommendations were provided to patient.     LRandel Pigg LPN   [id number]  Nurse Notes: none

## 2021-02-02 NOTE — Patient Instructions (Addendum)
Erin Hatfield , Thank you for taking time to come for your Medicare Wellness Visit. I appreciate your ongoing commitment to your health goals. Please review the following plan we discussed and let me know if I can assist you in the future.   Screening recommendations/referrals: Colonoscopy: 03/01/2015  due 2026 Mammogram: 11/11/2020 Bone Density: 04/11/2020 Recommended yearly ophthalmology/optometry visit for glaucoma screening and checkup Recommended yearly dental visit for hygiene and checkup  Vaccinations: Influenza vaccine: due in fall 2022  Pneumococcal vaccine: completed series  Tdap vaccine: due upon injury  Shingles vaccine: will consider     Advanced directives: none   Conditions/risks identified: none   Next appointment: [facility]  02/05/2022  0815  telephone visit    Preventive Care 72 Years and Older, Female Preventive care refers to lifestyle choices and visits with your health care provider that can promote health and wellness. What does preventive care include? A yearly physical exam. This is also called an annual well check. Dental exams once or twice a year. Routine eye exams. Ask your health care provider how often you should have your eyes checked. Personal lifestyle choices, including: Daily care of your teeth and gums. Regular physical activity. Eating a healthy diet. Avoiding tobacco and drug use. Limiting alcohol use. Practicing safe sex. Taking low-dose aspirin every day. Taking vitamin and mineral supplements as recommended by your health care provider. What happens during an annual well check? The services and screenings done by your health care provider during your annual well check will depend on your age, overall health, lifestyle risk factors, and family history of disease. Counseling  Your health care provider may ask you questions about your: Alcohol use. Tobacco use. Drug use. Emotional well-being. Home and relationship well-being. Sexual  activity. Eating habits. History of falls. Memory and ability to understand (cognition). Work and work Statistician. Reproductive health. Screening  You may have the following tests or measurements: Height, weight, and BMI. Blood pressure. Lipid and cholesterol levels. These may be checked every 5 years, or more frequently if you are over 39 years old. Skin check. Lung cancer screening. You may have this screening every year starting at age 32 if you have a 30-pack-year history of smoking and currently smoke or have quit within the past 15 years. Fecal occult blood test (FOBT) of the stool. You may have this test every year starting at age 79. Flexible sigmoidoscopy or colonoscopy. You may have a sigmoidoscopy every 5 years or a colonoscopy every 10 years starting at age 22. Hepatitis C blood test. Hepatitis B blood test. Sexually transmitted disease (STD) testing. Diabetes screening. This is done by checking your blood sugar (glucose) after you have not eaten for a while (fasting). You may have this done every 1-3 years. Bone density scan. This is done to screen for osteoporosis. You may have this done starting at age 78. Mammogram. This may be done every 1-2 years. Talk to your health care provider about how often you should have regular mammograms. Talk with your health care provider about your test results, treatment options, and if necessary, the need for more tests. Vaccines  Your health care provider may recommend certain vaccines, such as: Influenza vaccine. This is recommended every year. Tetanus, diphtheria, and acellular pertussis (Tdap, Td) vaccine. You may need a Td booster every 10 years. Zoster vaccine. You may need this after age 46. Pneumococcal 13-valent conjugate (PCV13) vaccine. One dose is recommended after age 43. Pneumococcal polysaccharide (PPSV23) vaccine. One dose is recommended after age  37. Talk to your health care provider about which screenings and vaccines  you need and how often you need them. This information is not intended to replace advice given to you by your health care provider. Make sure you discuss any questions you have with your health care provider. Document Released: 05/27/2015 Document Revised: 01/18/2016 Document Reviewed: 03/01/2015 Elsevier Interactive Patient Education  2017 [employer] [employer] Prevention in the Home Falls can cause injuries. They can happen to people of all ages. There are many things you can do to make your home safe and to help prevent falls. What can I do on the outside of my home? Regularly fix the edges of walkways and driveways and fix any cracks. Remove anything that might make you trip as you walk through a door, such as a raised step or threshold. Trim any bushes or trees on the path to your home. Use bright outdoor lighting. Clear any walking paths of anything that might make someone trip, such as rocks or tools. Regularly check to see if handrails are loose or broken. Make sure that both sides of any steps have handrails. Any raised decks and porches should have guardrails on the edges. Have any leaves, snow, or ice cleared regularly. Use sand or salt on walking paths during winter. Clean up any spills in your garage right away. This includes oil or grease spills. What can I do in the bathroom? Use night lights. Install grab bars by the toilet and in the tub and shower. Do not use towel bars as grab bars. Use non-skid mats or decals in the tub or shower. If you need to sit down in the shower, use a plastic, non-slip stool. Keep the floor dry. Clean up any water that spills on the floor as soon as it happens. Remove soap buildup in the tub or shower regularly. Attach bath mats securely with double-sided non-slip rug tape. Do not have throw rugs and other things on the floor that can make you trip. What can I do in the bedroom? Use night lights. Make sure that you have a light by your bed that  is easy to reach. Do not use any sheets or blankets that are too big for your bed. They should not hang down onto the floor. Have a firm chair that has side arms. You can use this for support while you get dressed. Do not have throw rugs and other things on the floor that can make you trip. What can I do in the kitchen? Clean up any spills right away. Avoid walking on wet floors. Keep items that you use a lot in easy-to-reach places. If you need to reach something above you, use a strong step stool that has a grab bar. Keep electrical cords out of the way. Do not use floor polish or wax that makes floors slippery. If you must use wax, use non-skid floor wax. Do not have throw rugs and other things on the floor that can make you trip. What can I do with my stairs? Do not leave any items on the stairs. Make sure that there are handrails on both sides of the stairs and use them. Fix handrails that are broken or loose. Make sure that handrails are as long as the stairways. Check any carpeting to make sure that it is firmly attached to the stairs. Fix any carpet that is loose or worn. Avoid having throw rugs at the top or bottom of the stairs. If you do have  throw rugs, attach them to the floor with carpet tape. Make sure that you have a light switch at the top of the stairs and the bottom of the stairs. If you do not have them, ask someone to add them for you. What else can I do to help prevent falls? Wear shoes that: Do not have high heels. Have rubber bottoms. Are comfortable and fit you well. Are closed at the toe. Do not wear sandals. If you use a stepladder: Make sure that it is fully opened. Do not climb a closed stepladder. Make sure that both sides of the stepladder are locked into place. Ask someone to hold it for you, if possible. Clearly mark and make sure that you can see: Any grab bars or handrails. First and last steps. Where the edge of each step is. Use tools that help you  move around (mobility aids) if they are needed. These include: Canes. Walkers. Scooters. Crutches. Turn on the lights when you go into a dark area. Replace any light bulbs as soon as they burn out. Set up your furniture so you have a clear path. Avoid moving your furniture around. If any of your floors are uneven, fix them. If there are any pets around you, be aware of where they are. Review your medicines with your doctor. Some medicines can make you feel dizzy. This can increase your chance of falling. Ask your doctor what other things that you can do to help prevent falls. This information is not intended to replace advice given to you by your health care provider. Make sure you discuss any questions you have with your health care provider. Document Released: 02/24/2009 Document Revised: 10/06/2015 Document Reviewed: 06/04/2014 Elsevier Interactive Patient Education  2017 [employer].

## 2021-02-03 DIAGNOSIS — M25532 Pain in left wrist: Secondary | ICD-10-CM | POA: Diagnosis not present

## 2021-02-03 DIAGNOSIS — M79645 Pain in left finger(s): Secondary | ICD-10-CM | POA: Diagnosis not present

## 2021-02-03 DIAGNOSIS — M654 Radial styloid tenosynovitis [de Quervain]: Secondary | ICD-10-CM | POA: Diagnosis not present

## 2021-02-10 NOTE — Progress Notes (Signed)
   I, [name], LAT, ATC acting as a scribe for Erin Leader, MD.  Erin Hatfield is a 70 y.o. female who presents to [place] at [facility] today for f/u L De Quervain's tenosynovitis. Pt was last seen by Dr. Georgina Snell on 12/26/20 and was advised to wear a thumb spica splint, use Voltaren gel, and was referred to hand PT at [facility] and completed 3 visits. Today, pt reports some improvement in her pain. Pt locates pain to the radial aspect of her L thumb and into L wrist.   Dx imaging: 12/26/20 L wrist XR  Pertinent review of systems: No fevers or chills  Relevant historical information: Diabetes.  CKD 3.   Exam:  BP (!) 162/98   Pulse 78   Ht 5' 3.75" (1.619 m)   Wt 168 lb 3.2 oz (76.3 kg)   LMP 05/14/2000 (Approximate)   SpO2 98%   BMI 29.10 kg/m  General: Well Developed, well nourished, and in no acute distress.   MSK: Left wrist normal-appearing Mildly tender palpation radial styloid. Positive Finkelstein's test. Intact strength.    Lab and Radiology Results EXAM: LEFT WRIST - COMPLETE 3+ VIEW   COMPARISON:  None.   FINDINGS: Frontal, oblique, lateral, and ulnar deviated views of the left wrist are obtained. No acute fracture, subluxation, or dislocation. There is mild joint space narrowing and osteophyte formation at the radiocarpal joint and within the radial aspect of the carpus. Small ossific density adjacent to the radial styloid likely result of degenerative change. Mild osteoarthritis at the first carpometacarpal joint as well. Soft tissues are unremarkable.   IMPRESSION: 1. Mild osteoarthritis of the radiocarpal joint, radial aspect of the carpus, and first carpal metacarpal joint. No acute bony abnormality.     Electronically Signed   By: Randa Ngo M.D.   On: 12/26/2020 09:00 I, Erin Hatfield, personally (independently) visualized and performed the interpretation of the images attached in this  note.      Assessment and Plan: 70 y.o. female with left wrist pain primarily due to de Quervain's tenosynovitis.  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  Plan to continue hand PT for another month.  Recheck in 1 month.  If not better consider de Quervain's injection.  Patient agrees with plan.   Discussed warning signs or symptoms. Please see discharge instructions. Patient expresses understanding.  The above documentation has been reviewed and is accurate and complete Erin Hatfield, M.D.  Total encounter time 20 minutes including face-to-face time with the patient and, reviewing past medical record, and charting on the date of service.   Treatment plan and options

## 2021-02-13 ENCOUNTER — Ambulatory Visit: Payer: Medicare PPO | Admitting: Family Medicine

## 2021-02-13 ENCOUNTER — Other Ambulatory Visit: Payer: Self-pay

## 2021-02-13 ENCOUNTER — Ambulatory Visit: Payer: Self-pay

## 2021-02-13 VITALS — BP 162/98 | HR 78 | Ht 63.75 in | Wt 168.2 lb

## 2021-02-13 DIAGNOSIS — M654 Radial styloid tenosynovitis [de Quervain]: Secondary | ICD-10-CM | POA: Diagnosis not present

## 2021-02-13 DIAGNOSIS — M79645 Pain in left finger(s): Secondary | ICD-10-CM | POA: Diagnosis not present

## 2021-02-13 NOTE — Patient Instructions (Addendum)
Thank you for coming in today.   I recommend giving the therapy a month.   Recheck in 1 month.  Plan for injection then if not better.   Continue the hand PT.

## 2021-02-14 ENCOUNTER — Encounter: Payer: Self-pay | Admitting: Adult Health

## 2021-02-14 NOTE — Telephone Encounter (Signed)
Please advise 

## 2021-02-15 DIAGNOSIS — M654 Radial styloid tenosynovitis [de Quervain]: Secondary | ICD-10-CM | POA: Diagnosis not present

## 2021-02-15 DIAGNOSIS — M79645 Pain in left finger(s): Secondary | ICD-10-CM | POA: Diagnosis not present

## 2021-02-15 DIAGNOSIS — M25532 Pain in left wrist: Secondary | ICD-10-CM | POA: Diagnosis not present

## 2021-02-20 DIAGNOSIS — M79645 Pain in left finger(s): Secondary | ICD-10-CM | POA: Diagnosis not present

## 2021-02-20 DIAGNOSIS — M654 Radial styloid tenosynovitis [de Quervain]: Secondary | ICD-10-CM | POA: Diagnosis not present

## 2021-02-20 DIAGNOSIS — M25532 Pain in left wrist: Secondary | ICD-10-CM | POA: Diagnosis not present

## 2021-02-22 DIAGNOSIS — M79645 Pain in left finger(s): Secondary | ICD-10-CM | POA: Diagnosis not present

## 2021-02-22 DIAGNOSIS — M25532 Pain in left wrist: Secondary | ICD-10-CM | POA: Diagnosis not present

## 2021-02-22 DIAGNOSIS — M654 Radial styloid tenosynovitis [de Quervain]: Secondary | ICD-10-CM | POA: Diagnosis not present

## 2021-02-27 DIAGNOSIS — M25532 Pain in left wrist: Secondary | ICD-10-CM | POA: Diagnosis not present

## 2021-02-27 DIAGNOSIS — M79645 Pain in left finger(s): Secondary | ICD-10-CM | POA: Diagnosis not present

## 2021-02-27 DIAGNOSIS — M654 Radial styloid tenosynovitis [de Quervain]: Secondary | ICD-10-CM | POA: Diagnosis not present

## 2021-02-28 ENCOUNTER — Other Ambulatory Visit: Payer: Self-pay

## 2021-02-28 ENCOUNTER — Ambulatory Visit (INDEPENDENT_AMBULATORY_CARE_PROVIDER_SITE_OTHER): Payer: Medicare PPO

## 2021-02-28 DIAGNOSIS — Z23 Encounter for immunization: Secondary | ICD-10-CM

## 2021-03-01 ENCOUNTER — Ambulatory Visit: Payer: Medicare PPO | Admitting: Adult Health

## 2021-03-01 ENCOUNTER — Encounter: Payer: Self-pay | Admitting: Adult Health

## 2021-03-01 DIAGNOSIS — M654 Radial styloid tenosynovitis [de Quervain]: Secondary | ICD-10-CM | POA: Diagnosis not present

## 2021-03-01 DIAGNOSIS — M79645 Pain in left finger(s): Secondary | ICD-10-CM | POA: Diagnosis not present

## 2021-03-01 DIAGNOSIS — M25532 Pain in left wrist: Secondary | ICD-10-CM | POA: Diagnosis not present

## 2021-03-15 DIAGNOSIS — M654 Radial styloid tenosynovitis [de Quervain]: Secondary | ICD-10-CM | POA: Diagnosis not present

## 2021-03-15 DIAGNOSIS — M25532 Pain in left wrist: Secondary | ICD-10-CM | POA: Diagnosis not present

## 2021-03-15 DIAGNOSIS — M79645 Pain in left finger(s): Secondary | ICD-10-CM | POA: Diagnosis not present

## 2021-03-16 ENCOUNTER — Other Ambulatory Visit: Payer: Self-pay | Admitting: Adult Health

## 2021-03-16 DIAGNOSIS — E139 Other specified diabetes mellitus without complications: Secondary | ICD-10-CM

## 2021-03-16 DIAGNOSIS — E1140 Type 2 diabetes mellitus with diabetic neuropathy, unspecified: Secondary | ICD-10-CM

## 2021-03-17 ENCOUNTER — Telehealth: Payer: Self-pay | Admitting: Adult Health

## 2021-03-17 DIAGNOSIS — E139 Other specified diabetes mellitus without complications: Secondary | ICD-10-CM

## 2021-03-17 DIAGNOSIS — M79645 Pain in left finger(s): Secondary | ICD-10-CM | POA: Diagnosis not present

## 2021-03-17 DIAGNOSIS — M654 Radial styloid tenosynovitis [de Quervain]: Secondary | ICD-10-CM | POA: Diagnosis not present

## 2021-03-17 DIAGNOSIS — M25532 Pain in left wrist: Secondary | ICD-10-CM | POA: Diagnosis not present

## 2021-03-17 DIAGNOSIS — E1140 Type 2 diabetes mellitus with diabetic neuropathy, unspecified: Secondary | ICD-10-CM

## 2021-03-17 NOTE — Progress Notes (Signed)
I, Erin Hatfield, LAT, ATC acting as a scribe for Erin Leader, MD.  Erin Hatfield is a 70 y.o. female who presents to [place] at [facility] today for f/u of L thumb and radial-sided wrist pain due to deQuervain's tenosynovitis.  She was last seen by Dr. Georgina Hatfield on 02/13/21 and noted some improvement after completing 3 hand therapy sessions.  She was advised to con't w/ hand therapy for another month and if not better, return for a possible injection.  Today, pt reports having good days and bad. Pt has completed 10 total hand therapy sessions to date. Pt notes slight improvement in pain, and had a flare of increased pain about a week ago.  Dx imaging: 12/26/20 L wrist XR  Pertinent review of systems: No fevers or chills  Relevant historical information: Diabetes well controlled with A1c 6.82 months ago.   Exam:  BP (!) 162/88   Pulse 83   Ht 5' 3.75" (1.619 m)   Wt 166 lb 6.4 oz (75.5 kg)   LMP 05/14/2000 (Approximate)   SpO2 99%   BMI 28.79 kg/m  General: Well Developed, well nourished, and in no acute distress.   MSK: Left wrist slightly swollen and radial styloid.  Tender palpation radial styloid.  Positive Finkelstein's test.    Lab and Radiology Results  Procedure: Real-time Ultrasound Guided Injection of left first dorsal compartment tendon sheath. Tennis Must Quervain's injection) Device: Philips Affiniti 50G Images permanently stored and available for review in PACS Verbal informed consent obtained.  Discussed risks and benefits of procedure. Warned about infection bleeding damage to structures skin hypopigmentation and fat atrophy among others. Patient expresses understanding and agreement Time-out conducted.   Noted no overlying erythema, induration, or other signs of local infection.   Skin prepped in a sterile fashion.   Local anesthesia: Topical Ethyl chloride.   With sterile technique and under real time ultrasound guidance: 40 mg of Kenalog and 1  mL of lidocaine injected into tendon sheath. Fluid seen entering the tendon sheath.   Completed without difficulty   Pain immediately resolved suggesting accurate placement of the medication.   Advised to call if fevers/chills, erythema, induration, drainage, or persistent bleeding.   Images permanently stored and available for review in the ultrasound unit.  Impression: Technically successful ultrasound guided injection.      Assessment and Plan: 70 y.o. female with left wrist pain due to de Quervain's tenosynovitis.  Patient had mild improvement with conservative management strategies including bracing, and hand PT/OT.  She had 10 sessions of PT/OT.  At this point she is not doing sufficiently well.  Plan to proceed with trial of de Quervain's tendon sheath injection.  If not doing well after this injection would recommend consultation with hand surgery to discuss surgical options.   PDMP not reviewed this encounter. Orders Placed This Encounter  Procedures   [place] LIMITED JOINT SPACE STRUCTURES UP LEFT(NO LINKED CHARGES)    Standing Status:   Future    Number of Occurrences:   1    Standing Expiration Date:   09/17/2021    Order Specific Question:   Reason for Exam (SYMPTOM  OR DIAGNOSIS REQUIRED)    Answer:   left thumb pain    Order Specific Question:   Preferred imaging location?    Answer:   [facility]   No orders of the defined types were placed in this encounter.    Discussed warning signs or symptoms. Please see discharge instructions.  Patient expresses understanding.   The above documentation has been reviewed and is accurate and complete Erin Hatfield, M.D.

## 2021-03-20 ENCOUNTER — Ambulatory Visit: Payer: Self-pay

## 2021-03-20 ENCOUNTER — Ambulatory Visit: Payer: Medicare PPO | Admitting: Family Medicine

## 2021-03-20 ENCOUNTER — Other Ambulatory Visit: Payer: Self-pay

## 2021-03-20 VITALS — BP 162/88 | HR 83 | Ht 63.75 in | Wt 166.4 lb

## 2021-03-20 DIAGNOSIS — M79645 Pain in left finger(s): Secondary | ICD-10-CM

## 2021-03-20 DIAGNOSIS — M654 Radial styloid tenosynovitis [de Quervain]: Secondary | ICD-10-CM

## 2021-03-20 NOTE — Patient Instructions (Addendum)
Thank you for coming in today.   You received a steroid injection in your left thumb today. Seek immediate medical attention if the joint becomes red, extremely painful, or is oozing fluid.   Use the thumb spica splint, especially with heavy duty activity.  Recheck back as needed. If not better will consider a referral to hand surgery.

## 2021-03-21 NOTE — Telephone Encounter (Signed)
Patient called to get refill on amitriptyline (ELAVIL) 50 MG tablet   Please send to  [facility] [place] [address] Phone:  [phone]  Fax:  [phone]          Good callback number is [phone]      Please advise

## 2021-03-22 MED ORDER — AMITRIPTYLINE HCL 50 MG PO TABS: 50.0000 mg | ORAL_TABLET | Freq: Every day | ORAL | 0 refills | Status: DC

## 2021-03-22 NOTE — Telephone Encounter (Signed)
Rx sent in electronically.  

## 2021-03-22 NOTE — Addendum Note (Signed)
Addended by: Gwenyth Ober R on: 03/22/2021 08:21 AM   Modules accepted: Orders

## 2021-06-30 ENCOUNTER — Encounter: Payer: Self-pay | Admitting: Adult Health

## 2021-06-30 ENCOUNTER — Ambulatory Visit (INDEPENDENT_AMBULATORY_CARE_PROVIDER_SITE_OTHER): Payer: Medicare PPO | Admitting: Adult Health

## 2021-06-30 VITALS — BP 138/88 | Temp 98.5°F | Ht 63.75 in | Wt 169.0 lb

## 2021-06-30 DIAGNOSIS — E1140 Type 2 diabetes mellitus with diabetic neuropathy, unspecified: Secondary | ICD-10-CM | POA: Diagnosis not present

## 2021-06-30 DIAGNOSIS — E1142 Type 2 diabetes mellitus with diabetic polyneuropathy: Secondary | ICD-10-CM | POA: Diagnosis not present

## 2021-06-30 DIAGNOSIS — I10 Essential (primary) hypertension: Secondary | ICD-10-CM

## 2021-06-30 DIAGNOSIS — E782 Mixed hyperlipidemia: Secondary | ICD-10-CM

## 2021-06-30 DIAGNOSIS — Z0000 Encounter for general adult medical examination without abnormal findings: Secondary | ICD-10-CM | POA: Diagnosis not present

## 2021-06-30 DIAGNOSIS — E559 Vitamin D deficiency, unspecified: Secondary | ICD-10-CM

## 2021-06-30 MED ORDER — LISINOPRIL 20 MG PO TABS: 20.0000 mg | ORAL_TABLET | Freq: Every day | ORAL | 3 refills | Status: DC

## 2021-06-30 MED ORDER — ATENOLOL 50 MG PO TABS: 75.0000 mg | ORAL_TABLET | Freq: Every day | ORAL | 3 refills | Status: DC

## 2021-06-30 MED ORDER — METFORMIN HCL 500 MG PO TABS: ORAL_TABLET | ORAL | 1 refills | Status: DC

## 2021-06-30 MED ORDER — FENOFIBRATE 145 MG PO TABS: 145.0000 mg | ORAL_TABLET | Freq: Every day | ORAL | 3 refills | Status: DC

## 2021-06-30 MED ORDER — SIMVASTATIN 5 MG PO TABS: 5.0000 mg | ORAL_TABLET | Freq: Every day | ORAL | 3 refills | Status: DC

## 2021-06-30 MED ORDER — AMITRIPTYLINE HCL 50 MG PO TABS: 50.0000 mg | ORAL_TABLET | Freq: Every day | ORAL | 3 refills | Status: DC

## 2021-06-30 NOTE — Addendum Note (Signed)
Addended by: Rosalyn Gess D on: 06/30/2021 01:47 PM   Modules accepted: Orders

## 2021-06-30 NOTE — Progress Notes (Signed)
Subjective:    Patient ID: Erin Hatfield, female    DOB: February 28, 1951, 71 y.o.   MRN: [id number]  HPI Patient presents for yearly preventative medicine examination. She is a pleasant 71 year old female who  has a past medical history of Allergy, Bunion, left foot, Complication of anesthesia, Diabetes mellitus without complication ([place]), Family history of breast cancer, Family history of ovarian cancer, Hot flashes, Hypertension, and PONV (postoperative nausea and vomiting).  DM Type 2 -managed with metformin 500 mg in the morning and 1000 mg in the evening.  She reports her blood sugars have been in the 120s to 140s. For the most part but have been a little higher, upwards of 160 since receiving a steroid injection for carpal tunnel.   She denies episodes of hypoglycemia.  She does take amitriptyline 50 mg nightly for her diabetic neuropathy. Lab Results  Component Value Date   HGBA1C 6.8 (A) 12/27/2020   Essential Hypertension -managed with atenolol 75 mg daily and lisinopril 10 mg daily.  She denies dizziness, lightheadedness, chest pain, shortness of breath.  She does monitor her blood pressures at home with readings consistently in the 130s to 150s over 70s to 80s.  She denies dizziness, lightheadedness, chest pain, or shortness of breath BP Readings from Last 3 Encounters:  06/30/21 138/88  03/20/21 (!) 162/88  02/13/21 (!) 162/98   Hyperlipidemia -prescribed Tricor 145 mg daily and simvastatin 5 mg daily.  She denies myalgia or fatigue Lab Results  Component Value Date   CHOL 182 06/28/2020   HDL 45.70 06/28/2020   LDLCALC 104 (H) 06/28/2020   LDLDIRECT 81.0 06/26/2019   TRIG 163.0 (H) 06/28/2020   CHOLHDL 4 06/28/2020   Vitamin D Deficiency  - takes Vitamin D 1000 units daily  All immunizations and health maintenance protocols were reviewed with the patient and needed orders were placed.  Appropriate screening laboratory values were ordered for the patient including  screening of hyperlipidemia, renal function and hepatic function.   Medication reconciliation,  past medical history, social history, problem list and allergies were reviewed in detail with the patient  Goals were established with regard to weight loss, exercise, and  diet in compliance with medications. She is back to walking on a daily basis.  Wt Readings from Last 3 Encounters:  06/30/21 169 lb (76.7 kg)  03/20/21 166 lb 6.4 oz (75.5 kg)  02/13/21 168 lb 3.2 oz (76.3 kg)    Review of Systems  Constitutional: Negative.   HENT: Negative.    Eyes: Negative.   Respiratory: Negative.    Cardiovascular: Negative.   Gastrointestinal: Negative.   Endocrine: Negative.   Genitourinary: Negative.   Musculoskeletal: Negative.   Skin: Negative.   Allergic/Immunologic: Negative.   Neurological:  Positive for numbness.  Hematological: Negative.   Psychiatric/Behavioral: Negative.     Past Medical History:  Diagnosis Date   Allergy    Bunion, left foot    Complication of anesthesia    Diabetes mellitus without complication (HCC)    type 2   Family history of breast cancer    Family history of ovarian cancer    Hot flashes    Hypertension    PONV (postoperative nausea and vomiting)     Social History   Socioeconomic History   Marital status: Married    Spouse name: Not on file   Number of children: Not on file   Years of education: Not on file   Highest education level: Not on file  Occupational History   Not on file  Tobacco Use   Smoking status: Never   Smokeless tobacco: Never  Vaping Use   Vaping Use: Never used  Substance and Sexual Activity   Alcohol use: No   Drug use: No   Sexual activity: Not Currently  Other Topics Concern   Not on file  Social History Narrative   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, environmental   Married for 23 years   One daughter ( [place])    One grandson   Social Determinants of  Radio broadcast assistant Strain: Low Risk    Difficulty of Paying Living Expenses: Not hard at [employer] Insecurity: No Food Insecurity   Worried About Charity fundraiser in the Last Year: Never true   Arboriculturist in the Last Year: Never true  Transportation Needs: No Transportation Needs   Lack of Transportation (Medical): No   Lack of Transportation (Non-Medical): No  Physical Activity: Insufficiently Active   Days of Exercise per Week: 3 days   Minutes of Exercise per Session: 40 min  Stress: No Stress Concern Present   Feeling of Stress : Not at all  Social Connections: Moderately Integrated   Frequency of Communication with Friends and Family: Three times a week   Frequency of Social Gatherings with Friends and Family: Three times a week   Attends Religious Services: More than 4 times per year   Active Member of Clubs or Organizations: No   Attends Archivist Meetings: Never   Marital Status: Married  Human resources officer Violence: Not At Risk   Fear of Current or Ex-Partner: No   Emotionally Abused: No   Physically Abused: No   Sexually Abused: No    Past Surgical History:  Procedure Laterality Date   LAPAROSCOPIC BILATERAL SALPINGO OOPHERECTOMY Bilateral 10/13/2018   Procedure: LAPAROSCOPIC BILATERAL SALPINGO OOPHORECTOMY;  Surgeon: Megan Salon, MD;  Location: [facility];  Service: Gynecology;  Laterality: Bilateral;   VAGINAL HYSTERECTOMY  2002   fibroids partial    Family History  Problem Relation Age of Onset   Diabetes Sister    Ovarian cancer Sister 64   Breast cancer Mother 45   Lung cancer Father        Smoker   Diabetes Other    Hypertension Other    Diabetes Sister    Breast cancer Niece 53   Colon cancer Neg Hx     No Known Allergies  Current Outpatient Medications on File Prior to Visit  Medication Sig Dispense Refill   Accu-Chek Softclix Lancets lancets 1 each by Other route daily at 2 PM. Use once daily Dx E11.9  100 each 0   Alcohol Swabs (B-D SINGLE USE SWABS REGULAR) PADS Use to clean finger before checking blood sugar 2 times daily 100 each 1   aspirin 81 MG tablet Take 81 mg by mouth daily.     Blood Glucose Calibration (ACCU-CHEK GUIDE CONTROL) LIQD 1 Bottle by In Vitro route as needed. 1 each 1   Blood Glucose Monitoring Suppl (ACCU-CHEK GUIDE ME) w/Device KIT 2 Sticks by Does not apply route 3 times/day as needed-between meals & bedtime. Used to check blood sugar 2 times daily 1 kit 0   cetirizine (ZYRTEC) 10 MG tablet Take 10 mg by mouth as needed.     cholecalciferol (VITAMIN D) 1000 UNITS tablet Take 1,000 Units by mouth daily.  Chromium-Cinnamon 100-500 MCG-MG CAPS Take by mouth 2 (two) times daily.     fish oil-omega-3 fatty acids 1000 MG capsule Take 1 g by mouth daily.     glucose blood (ACCU-CHEK GUIDE) test strip Used to check blood sugar 2 times daily 100 each 12   Multiple Vitamin (MULTIVITAMIN) tablet Take 1 tablet by mouth daily.     No current facility-administered medications on file prior to visit.    BP 138/88 Comment: home   Temp 98.5 F (36.9 C) (Oral)    Ht 5' 3.75" (1.619 m)    Wt 169 lb (76.7 kg)    LMP 05/14/2000 (Approximate)    SpO2 97%    BMI 29.24 kg/m       Objective:   Physical Exam Vitals and nursing note reviewed.  Constitutional:      General: She is not in acute distress.    Appearance: Normal appearance. She is well-developed. She is not ill-appearing.  HENT:     Head: Normocephalic and atraumatic.     Right Ear: Tympanic membrane, ear canal and external ear normal. There is no impacted cerumen.     Left Ear: Tympanic membrane, ear canal and external ear normal. There is no impacted cerumen.     Nose: Nose normal. No congestion or rhinorrhea.     Mouth/Throat:     Mouth: Mucous membranes are moist.     Pharynx: Oropharynx is clear. No oropharyngeal exudate or posterior oropharyngeal erythema.  Eyes:     General:        Right eye: No discharge.         Left eye: No discharge.     Extraocular Movements: Extraocular movements intact.     Conjunctiva/sclera: Conjunctivae normal.     Pupils: Pupils are equal, round, and reactive to light.  Neck:     Thyroid: No thyromegaly.     Vascular: No carotid bruit.     Trachea: No tracheal deviation.  Cardiovascular:     Rate and Rhythm: Normal rate and regular rhythm.     Pulses: Normal pulses.     Heart sounds: Normal heart sounds. No murmur heard.   No friction rub. No gallop.  Pulmonary:     Effort: Pulmonary effort is normal. No respiratory distress.     Breath sounds: Normal breath sounds. No stridor. No wheezing, rhonchi or rales.  Chest:     Chest wall: No tenderness.  Abdominal:     General: Abdomen is flat. Bowel sounds are normal. There is no distension.     Palpations: Abdomen is soft. There is no mass.     Tenderness: There is no abdominal tenderness. There is no right CVA tenderness, left CVA tenderness, guarding or rebound.     Hernia: No hernia is present.  Musculoskeletal:        General: No swelling, tenderness, deformity or signs of injury. Normal range of motion.     Cervical back: Normal range of motion and neck supple.     Right lower leg: No edema.     Left lower leg: No edema.  Lymphadenopathy:     Cervical: No cervical adenopathy.  Skin:    General: Skin is warm and dry.     Coloration: Skin is not jaundiced or pale.     Findings: No bruising, erythema, lesion or rash.  Neurological:     General: No focal deficit present.     Mental Status: She is alert and oriented to person, place, and  time.     Cranial Nerves: No cranial nerve deficit.     Sensory: No sensory deficit.     Motor: No weakness.     Coordination: Coordination normal.     Gait: Gait normal.     Deep Tendon Reflexes: Reflexes normal.  Psychiatric:        Mood and Affect: Mood normal.        Behavior: Behavior normal.        Thought Content: Thought content normal.        Judgment:  Judgment normal.      Assessment & Plan:  1. Routine general medical examination at a health care facility - Continue to eat healthy and exercise - Follow up in one year or sooner if needed - CBC with Differential/Platelet; Future - Comprehensive metabolic panel; Future - Hemoglobin A1c; Future - Lipid panel; Future - TSH; Future - Magnesium; Future  2. Mixed hyperlipidemia - Continue statin and Fenofibrate  - CBC with Differential/Platelet; Future - Comprehensive metabolic panel; Future - Hemoglobin A1c; Future - Lipid panel; Future - TSH; Future - simvastatin (ZOCOR) 5 MG tablet; Take 1 tablet (5 mg total) by mouth daily.  Dispense: 90 tablet; Refill: 3 - fenofibrate (TRICOR) 145 MG tablet; Take 1 tablet (145 mg total) by mouth daily.  Dispense: 90 tablet; Refill: 3  3. Diabetic neuropathy, painful ([place]) - Continue Elavil  - CBC with Differential/Platelet; Future - Comprehensive metabolic panel; Future - Hemoglobin A1c; Future - Lipid panel; Future - TSH; Future - amitriptyline (ELAVIL) 50 MG tablet; Take 1 tablet (50 mg total) by mouth at bedtime.  Dispense: 90 tablet; Refill: 3  4. Type 2 diabetes mellitus with diabetic polyneuropathy, without long-term current use of insulin (HCC) -Consider dose change of Metformin  - CBC with Differential/Platelet; Future - Comprehensive metabolic panel; Future - Hemoglobin A1c; Future - Lipid panel; Future - TSH; Future - metFORMIN (GLUCOPHAGE) 500 MG tablet; TAKE 1 TABLET BY MOUTH ONCE DAILY IN THE MORNING AND 2 ONCE DAILY IN THE EVENING  Dispense: 270 tablet; Refill: 1  5. Essential hypertension - Better controlled at home. Will increase lisinopril from 10 to 20 mg  - CBC with Differential/Platelet; Future - Comprehensive metabolic panel; Future - Hemoglobin A1c; Future - Lipid panel; Future - TSH; Future - atenolol (TENORMIN) 50 MG tablet; Take 1.5 tablets (75 mg total) by mouth daily.  Dispense: 135 tablet; Refill: 3 -  lisinopril (ZESTRIL) 20 MG tablet; Take 1 tablet (20 mg total) by mouth daily.  Dispense: 90 tablet; Refill: 3  6. Vitamin D deficiency  - VITAMIN D 25 Hydroxy (Vit-D Deficiency, Fractures); Future  [name], NP

## 2021-06-30 NOTE — Patient Instructions (Addendum)
It was great seeing you today   We will follow up with you regarding your lab work   Please let me know if you need anything   

## 2021-07-03 ENCOUNTER — Other Ambulatory Visit (INDEPENDENT_AMBULATORY_CARE_PROVIDER_SITE_OTHER): Payer: Medicare PPO

## 2021-07-03 DIAGNOSIS — E559 Vitamin D deficiency, unspecified: Secondary | ICD-10-CM | POA: Diagnosis not present

## 2021-07-03 DIAGNOSIS — E1142 Type 2 diabetes mellitus with diabetic polyneuropathy: Secondary | ICD-10-CM

## 2021-07-03 DIAGNOSIS — E1140 Type 2 diabetes mellitus with diabetic neuropathy, unspecified: Secondary | ICD-10-CM | POA: Diagnosis not present

## 2021-07-03 DIAGNOSIS — E782 Mixed hyperlipidemia: Secondary | ICD-10-CM

## 2021-07-03 DIAGNOSIS — Z0000 Encounter for general adult medical examination without abnormal findings: Secondary | ICD-10-CM | POA: Diagnosis not present

## 2021-07-03 DIAGNOSIS — I10 Essential (primary) hypertension: Secondary | ICD-10-CM | POA: Diagnosis not present

## 2021-07-03 LAB — COMPREHENSIVE METABOLIC PANEL
ALT: 16 U/L (ref 0–35)
AST: 19 U/L (ref 0–37)
Albumin: 4.2 g/dL (ref 3.5–5.2)
Alkaline Phosphatase: 44 U/L (ref 39–117)
BUN: 12 mg/dL (ref 6–23)
CO2: 32 mEq/L (ref 19–32)
Calcium: 9.6 mg/dL (ref 8.4–10.5)
Chloride: 102 mEq/L (ref 96–112)
Creatinine, Ser: 0.88 mg/dL (ref 0.40–1.20)
GFR: 65.62 mL/min (ref >60.00)
Glucose, Bld: 188 mg/dL — ABNORMAL HIGH (ref 70–99)
Potassium: 3.6 mEq/L (ref 3.5–5.1)
Sodium: 139 mEq/L (ref 135–145)
Total Bilirubin: 0.4 mg/dL (ref 0.2–1.2)
Total Protein: 6.9 g/dL (ref 6.0–8.3)

## 2021-07-03 LAB — CBC WITH DIFFERENTIAL/PLATELET
Basophils Absolute: 0 10*3/uL (ref 0.0–0.1)
Basophils Relative: 0.3 % (ref 0.0–3.0)
Eosinophils Absolute: 0.2 10*3/uL (ref 0.0–0.7)
Eosinophils Relative: 2.8 % (ref 0.0–5.0)
HCT: 36.3 % (ref 36.0–46.0)
Hemoglobin: 11.5 g/dL — ABNORMAL LOW (ref 12.0–15.0)
Lymphocytes Relative: 51.6 % — ABNORMAL HIGH (ref 12.0–46.0)
Lymphs Abs: 3.6 10*3/uL (ref 0.7–4.0)
MCHC: 31.8 g/dL (ref 30.0–36.0)
MCV: 84.9 fl (ref 78.0–100.0)
Monocytes Absolute: 0.6 10*3/uL (ref 0.1–1.0)
Monocytes Relative: 9.2 % (ref 3.0–12.0)
Neutro Abs: 2.5 10*3/uL (ref 1.4–7.7)
Neutrophils Relative %: 36.1 % — ABNORMAL LOW (ref 43.0–77.0)
Platelets: 380 10*3/uL (ref 150.0–400.0)
RBC: 4.27 Mil/uL (ref 3.87–5.11)
RDW: 12.8 % (ref 11.5–15.5)
WBC: 6.9 10*3/uL (ref 4.0–10.5)

## 2021-07-03 LAB — LIPID PANEL
Cholesterol: 152 mg/dL (ref 0–200)
HDL: 37.5 mg/dL — ABNORMAL LOW (ref >39.00)
LDL Cholesterol: 83 mg/dL (ref 0–99)
NonHDL: 114.3
Total CHOL/HDL Ratio: 4
Triglycerides: 158 mg/dL — ABNORMAL HIGH (ref 0.0–149.0)
VLDL: 31.6 mg/dL (ref 0.0–40.0)

## 2021-07-03 LAB — VITAMIN D 25 HYDROXY (VIT D DEFICIENCY, FRACTURES): VITD: 46.53 ng/mL (ref 30.00–100.00)

## 2021-07-03 LAB — MAGNESIUM: Magnesium: 1.8 mg/dL (ref 1.5–2.5)

## 2021-07-03 LAB — TSH: TSH: 3.38 u[IU]/mL (ref 0.35–5.50)

## 2021-07-03 LAB — HEMOGLOBIN A1C: Hgb A1c MFr Bld: 7.5 % — ABNORMAL HIGH (ref 4.6–6.5)

## 2021-07-04 ENCOUNTER — Telehealth: Payer: Self-pay | Admitting: Adult Health

## 2021-07-04 NOTE — Telephone Encounter (Signed)
Updated patient on her labs. A1c increased to 7.5   She would like to work on lifestyle changes before changing medication. She will follow up in 3 months

## 2021-08-12 IMAGING — MG DIGITAL SCREENING BILAT W/ TOMO W/ CAD
6 of 10 series · 6 of 30 positions shown · non-contrast
Comparison: Previous exam(s).

ACR Breast Density Category a: The breast tissue is almost entirely
fatty.

CLINICAL DATA: Screening.

EXAM:
DIGITAL SCREENING BILATERAL MAMMOGRAM WITH TOMO AND CAD

[L CC synth-2D]
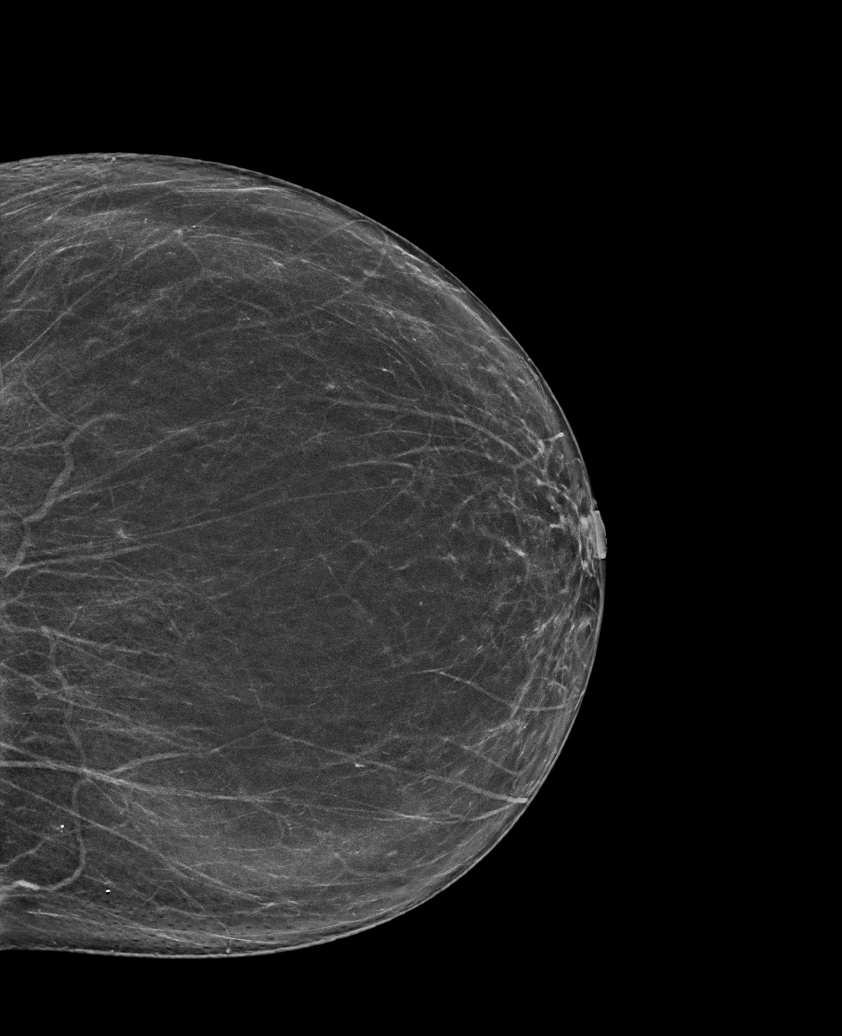

[L MLO synth-2D (1 of 2)]
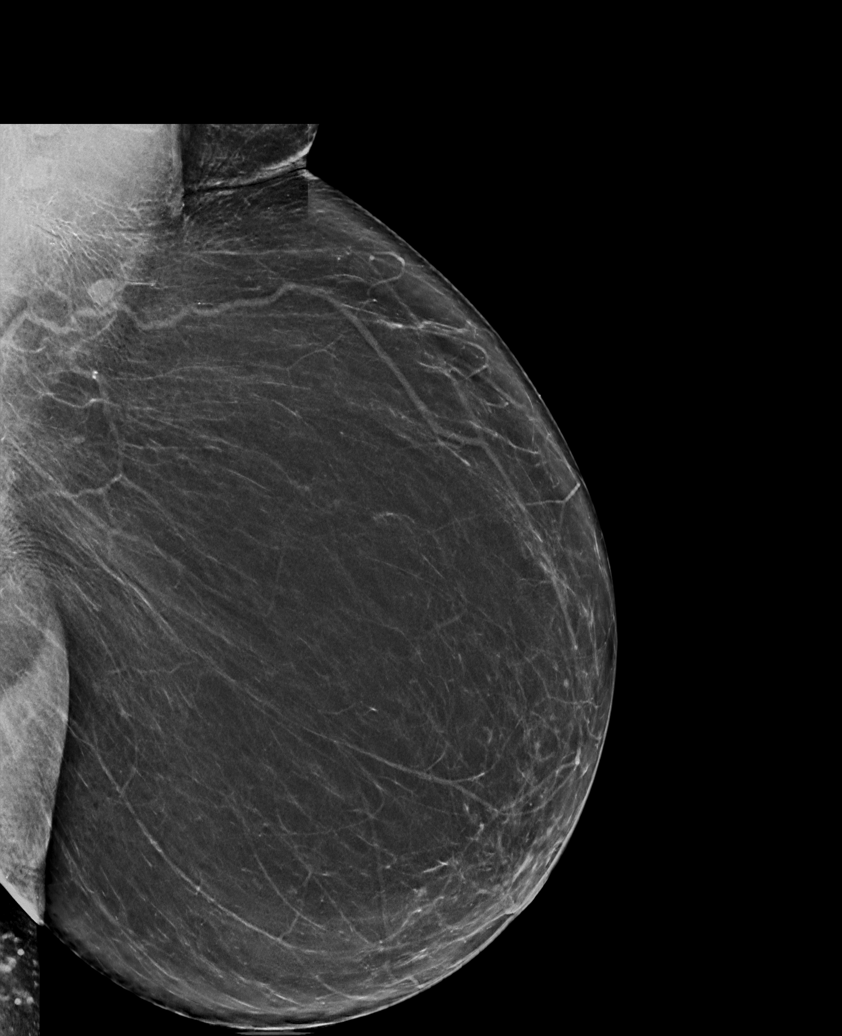

[R CC synth-2D]
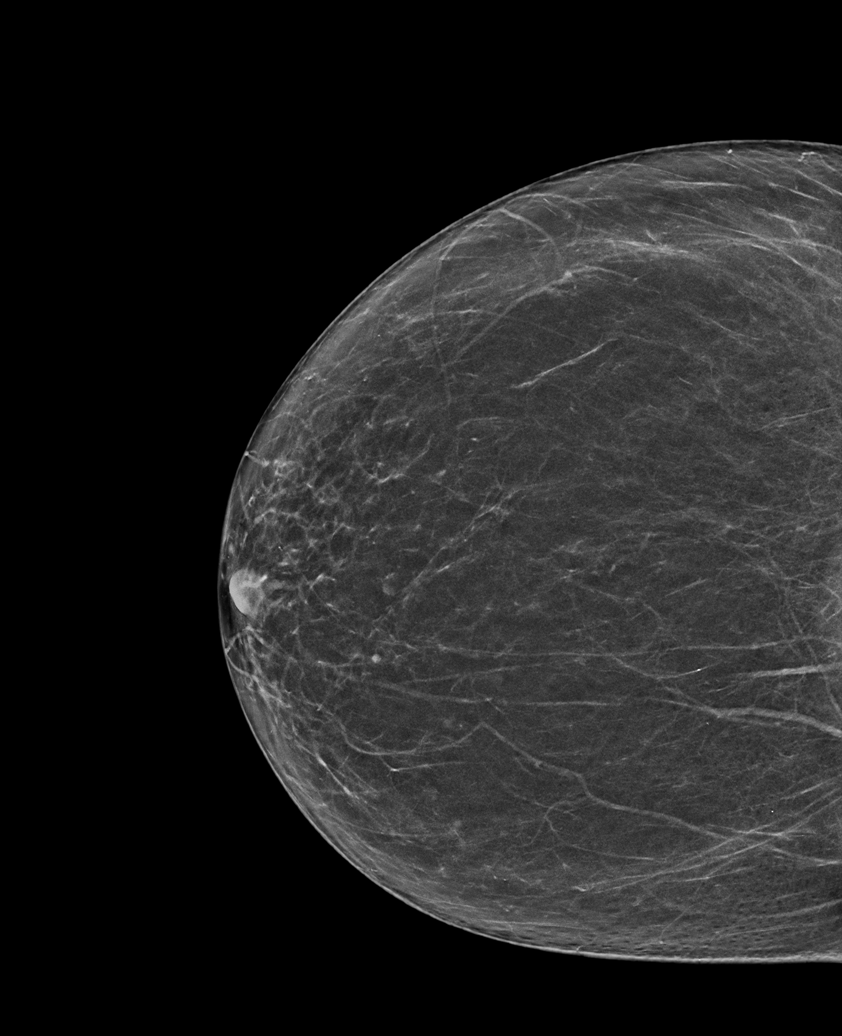

[L MLO synth-2D (2 of 2)]
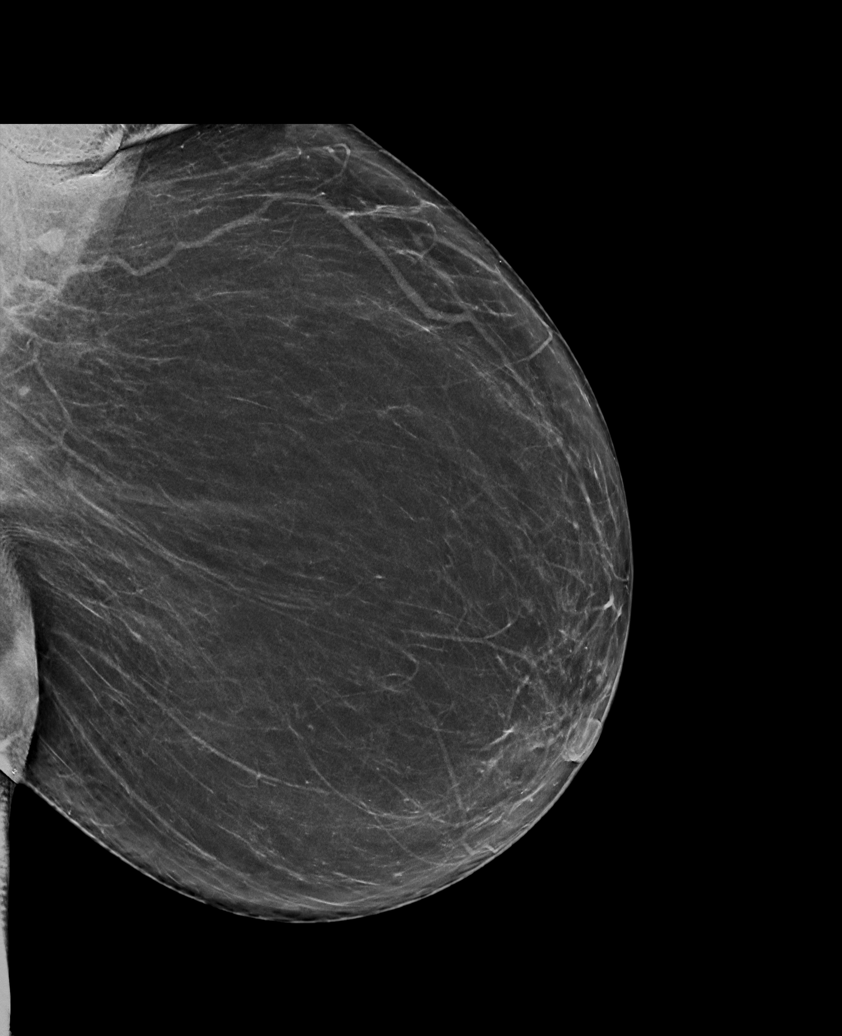

[R MLO synth-2D]
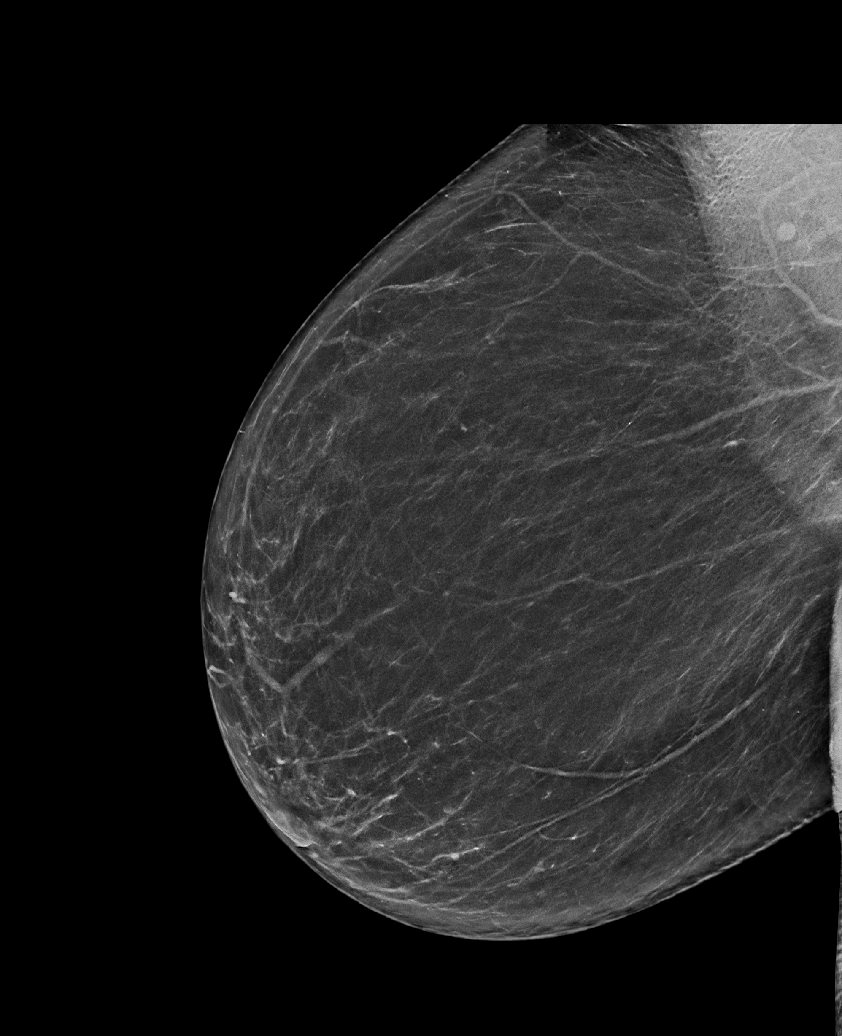

[L MLO tomo · tomo slice 40/79.0]
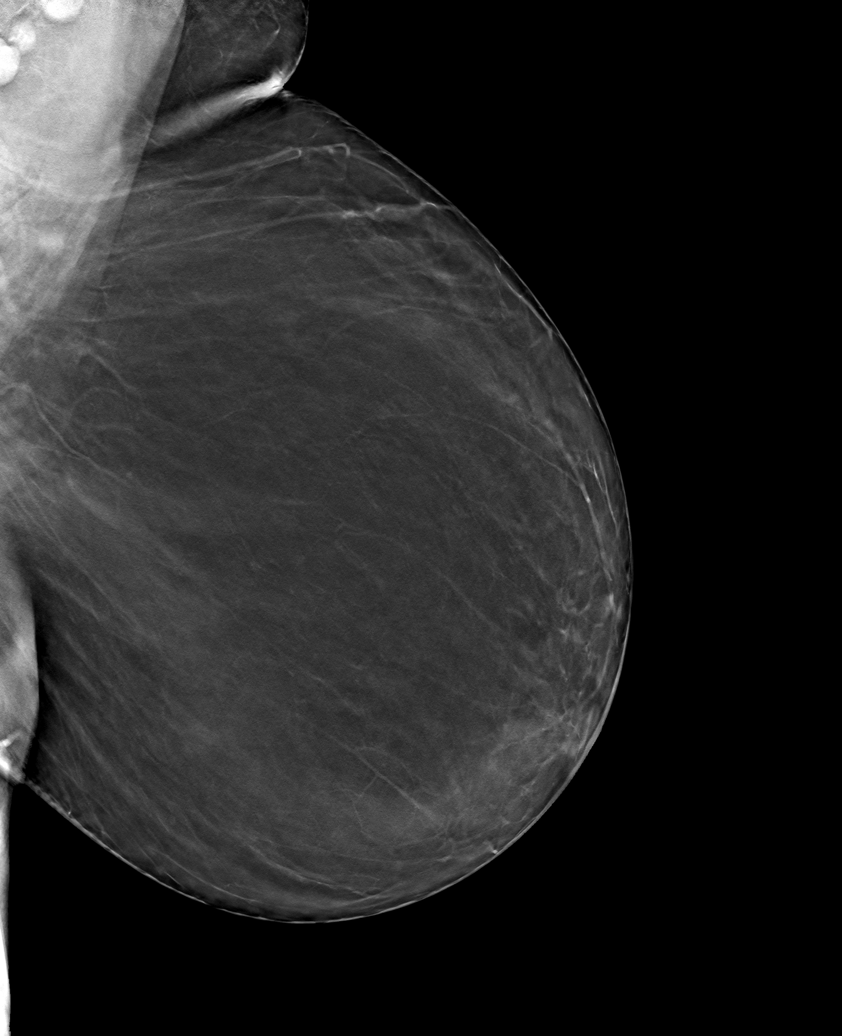

[6 of 30 positions shown; findings below may reference images not displayed]

FINDINGS: There are no findings suspicious for malignancy. Images were
processed with CAD.
IMPRESSION: No mammographic evidence of malignancy. A result letter of this
screening mammogram will be mailed directly to the patient.

RECOMMENDATION:
Screening mammogram in one year. (Code:[id number]-[id number])

BI-RADS CATEGORY  1: Negative.

## 2021-09-04 ENCOUNTER — Encounter: Payer: Self-pay | Admitting: Adult Health

## 2021-09-05 NOTE — Telephone Encounter (Signed)
Noted ? ? ?Lm for pt to return call ?

## 2021-09-05 NOTE — Telephone Encounter (Signed)
Please advise 

## 2021-09-06 ENCOUNTER — Other Ambulatory Visit: Payer: Self-pay

## 2021-09-06 MED ORDER — METFORMIN HCL 1000 MG PO TABS: 1000.0000 mg | ORAL_TABLET | Freq: Two times a day (BID) | ORAL | 3 refills | Status: DC

## 2021-09-06 NOTE — Telephone Encounter (Signed)
Left another message for pt to return call. Rx has bee sent to pharmacy.  ?

## 2021-09-29 ENCOUNTER — Other Ambulatory Visit: Payer: Self-pay | Admitting: Adult Health

## 2021-09-29 DIAGNOSIS — Z1231 Encounter for screening mammogram for malignant neoplasm of breast: Secondary | ICD-10-CM

## 2021-10-27 ENCOUNTER — Encounter: Payer: Self-pay | Admitting: Adult Health

## 2021-10-27 ENCOUNTER — Ambulatory Visit: Payer: Medicare PPO | Admitting: Adult Health

## 2021-10-27 VITALS — BP 156/94 | HR 82 | Temp 98.5°F | Ht 63.75 in | Wt 167.0 lb

## 2021-10-27 DIAGNOSIS — I10 Essential (primary) hypertension: Secondary | ICD-10-CM

## 2021-10-27 DIAGNOSIS — E1142 Type 2 diabetes mellitus with diabetic polyneuropathy: Secondary | ICD-10-CM | POA: Diagnosis not present

## 2021-10-27 LAB — POCT GLYCOSYLATED HEMOGLOBIN (HGB A1C): Hemoglobin A1C: 7.2 % — AB (ref 4.0–5.6)

## 2021-10-27 NOTE — Progress Notes (Signed)
Subjective:    Patient ID: Erin Hatfield, female    DOB: 03-28-1951, 71 y.o.   MRN: [id number]  HPI 71 year old female who  has a past medical history of Allergy, Bunion, left foot, Complication of anesthesia, Diabetes mellitus without complication ([place]), Family history of breast cancer, Family history of ovarian cancer, Hot flashes, Hypertension, and PONV (postoperative nausea and vomiting).  She presents to the office today for follow-up regarding diabetes and hypertension.  Diabetes mellitus type 2- managed with metformin 1000 mg in the morning and 1000 mg in the.  She does monitor her blood sugars at home and reports readings in the 120s to 140s.  She denies episodes of hypoglycemia.  She does take amitriptyline 50 mg nightly for diabetic neuropathy Lab Results  Component Value Date   HGBA1C 7.5 (H) 07/03/2021   Hypertension- managed with atenolol 75 mg daily and lisinopril 20 mg daily.  He denies dizziness, lightheadedness, chest pain.  During her last visit 3 months ago we increased his lisinopril from 10 mg to 20 mg. She has been checking her BP at home and getting readings in the 409-811'B systolic. She did not take her BP medication this morning  BP Readings from Last 3 Encounters:  10/27/21 (!) 156/94  06/30/21 138/88  03/20/21 (!) 162/88     Past Medical History:  Diagnosis Date   Allergy    Bunion, left foot    Complication of anesthesia    Diabetes mellitus without complication (HCC)    type 2   Family history of breast cancer    Family history of ovarian cancer    Hot flashes    Hypertension    PONV (postoperative nausea and vomiting)     Social History   Socioeconomic History   Marital status: Married    Spouse name: Not on file   Number of children: Not on file   Years of education: Not on file   Highest education level: Not on file  Occupational History   Not on file  Tobacco Use   Smoking status: Never   Smokeless tobacco: Never  Vaping Use    Vaping Use: Never used  Substance and Sexual Activity   Alcohol use: No   Drug use: No   Sexual activity: Not Currently  Other Topics Concern   Not on file  Social History Narrative   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, environmental   Married for 23 years   One daughter ( [place])    One grandson   Social Determinants of Health   Financial Resource Strain: Low Risk  (02/02/2021)   Overall Financial Resource Strain (CARDIA)    Difficulty of Paying Living Expenses: Not hard at all  Food Insecurity: No Food Insecurity (02/02/2021)   Hunger Vital Sign    Worried About Running Out of Food in the Last Year: Never true    [place] in the Last Year: Never true  Transportation Needs: No Transportation Needs (02/02/2021)   PRAPARE - Hydrologist (Medical): No    Lack of Transportation (Non-Medical): No  Physical Activity: Insufficiently Active (02/02/2021)   Exercise Vital Sign    Days of Exercise per Week: 3 days    Minutes of Exercise per Session: 40 min  Stress: No Stress Concern Present (02/02/2021)   [place]    Feeling of Stress : Not at  all  Social Connections: Moderately Integrated (02/02/2021)   Social Connection and Isolation Panel [NHANES]    Frequency of Communication with Friends and Family: Three times a week    Frequency of Social Gatherings with Friends and Family: Three times a week    Attends Religious Services: More than 4 times per year    Active Member of Clubs or Organizations: No    Attends Archivist Meetings: Never    Marital Status: Married  Human resources officer Violence: Not At Risk (02/02/2021)   Humiliation, Afraid, Rape, and Kick questionnaire    Fear of Current or Ex-Partner: No    Emotionally Abused: No    Physically Abused: No    Sexually Abused: No    Past Surgical History:  Procedure  Laterality Date   LAPAROSCOPIC BILATERAL SALPINGO OOPHERECTOMY Bilateral 10/13/2018   Procedure: LAPAROSCOPIC BILATERAL SALPINGO OOPHORECTOMY;  Surgeon: Megan Salon, MD;  Location: [facility];  Service: Gynecology;  Laterality: Bilateral;   VAGINAL HYSTERECTOMY  2002   fibroids partial    Family History  Problem Relation Age of Onset   Diabetes Sister    Ovarian cancer Sister 73   Breast cancer Mother 5   Lung cancer Father        Smoker   Diabetes Other    Hypertension Other    Diabetes Sister    Breast cancer Niece 30   Colon cancer Neg Hx     No Known Allergies     Review of Systems See HPI   Past Medical History:  Diagnosis Date   Allergy    Bunion, left foot    Complication of anesthesia    Diabetes mellitus without complication (HCC)    type 2   Family history of breast cancer    Family history of ovarian cancer    Hot flashes    Hypertension    PONV (postoperative nausea and vomiting)     Social History   Socioeconomic History   Marital status: Married    Spouse name: Not on file   Number of children: Not on file   Years of education: Not on file   Highest education level: Not on file  Occupational History   Not on file  Tobacco Use   Smoking status: Never   Smokeless tobacco: Never  Vaping Use   Vaping Use: Never used  Substance and Sexual Activity   Alcohol use: No   Drug use: No   Sexual activity: Not Currently  Other Topics Concern   Not on file  Social History Narrative   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, environmental   Married for 23 years   One daughter ( [place])    One grandson   Social Determinants of Health   Financial Resource Strain: Low Risk  (02/02/2021)   Overall Financial Resource Strain (CARDIA)    Difficulty of Paying Living Expenses: Not hard at all  Food Insecurity: No Food Insecurity (02/02/2021)   Hunger Vital Sign    Worried About Running  Out of Food in the Last Year: Never true    [place] in the Last Year: Never true  Transportation Needs: No Transportation Needs (02/02/2021)   PRAPARE - Hydrologist (Medical): No    Lack of Transportation (Non-Medical): No  Physical Activity: Insufficiently Active (02/02/2021)   Exercise Vital Sign    Days of Exercise per Week: 3 days  Minutes of Exercise per Session: 40 min  Stress: No Stress Concern Present (02/02/2021)   [place]    Feeling of Stress : Not at all  Social Connections: Moderately Integrated (02/02/2021)   Social Connection and Isolation Panel [NHANES]    Frequency of Communication with Friends and Family: Three times a week    Frequency of Social Gatherings with Friends and Family: Three times a week    Attends Religious Services: More than 4 times per year    Active Member of Clubs or Organizations: No    Attends Archivist Meetings: Never    Marital Status: Married  Human resources officer Violence: Not At Risk (02/02/2021)   Humiliation, Afraid, Rape, and Kick questionnaire    Fear of Current or Ex-Partner: No    Emotionally Abused: No    Physically Abused: No    Sexually Abused: No    Past Surgical History:  Procedure Laterality Date   LAPAROSCOPIC BILATERAL SALPINGO OOPHERECTOMY Bilateral 10/13/2018   Procedure: LAPAROSCOPIC BILATERAL SALPINGO OOPHORECTOMY;  Surgeon: Megan Salon, MD;  Location: [facility];  Service: Gynecology;  Laterality: Bilateral;   VAGINAL HYSTERECTOMY  2002   fibroids partial    Family History  Problem Relation Age of Onset   Diabetes Sister    Ovarian cancer Sister 41   Breast cancer Mother 74   Lung cancer Father        Smoker   Diabetes Other    Hypertension Other    Diabetes Sister    Breast cancer Niece 92   Colon cancer Neg Hx     No Known Allergies  Current Outpatient Medications on File  Prior to Visit  Medication Sig Dispense Refill   Accu-Chek Softclix Lancets lancets 1 each by Other route daily at 2 PM. Use once daily Dx E11.9 100 each 0   Alcohol Swabs (B-D SINGLE USE SWABS REGULAR) PADS Use to clean finger before checking blood sugar 2 times daily 100 each 1   amitriptyline (ELAVIL) 50 MG tablet Take 1 tablet (50 mg total) by mouth at bedtime. 90 tablet 3   aspirin 81 MG tablet Take 81 mg by mouth daily.     atenolol (TENORMIN) 50 MG tablet Take 1.5 tablets (75 mg total) by mouth daily. 135 tablet 3   Blood Glucose Calibration (ACCU-CHEK GUIDE CONTROL) LIQD 1 Bottle by In Vitro route as needed. 1 each 1   Blood Glucose Monitoring Suppl (ACCU-CHEK GUIDE ME) w/Device KIT 2 Sticks by Does not apply route 3 times/day as needed-between meals & bedtime. Used to check blood sugar 2 times daily 1 kit 0   cetirizine (ZYRTEC) 10 MG tablet Take 10 mg by mouth as needed.     cholecalciferol (VITAMIN D) 1000 UNITS tablet Take 1,000 Units by mouth daily.     Chromium-Cinnamon 100-500 MCG-MG CAPS Take by mouth 2 (two) times daily.     fenofibrate (TRICOR) 145 MG tablet Take 1 tablet (145 mg total) by mouth daily. 90 tablet 3   fish oil-omega-3 fatty acids 1000 MG capsule Take 1 g by mouth daily.     glucose blood (ACCU-CHEK GUIDE) test strip Used to check blood sugar 2 times daily 100 each 12   lisinopril (ZESTRIL) 20 MG tablet Take 1 tablet (20 mg total) by mouth daily. 90 tablet 3   metFORMIN (GLUCOPHAGE) 1000 MG tablet Take 1 tablet (1,000 mg total) by mouth 2 (two) times daily with a meal.  180 tablet 3   Multiple Vitamin (MULTIVITAMIN) tablet Take 1 tablet by mouth daily.     simvastatin (ZOCOR) 5 MG tablet Take 1 tablet (5 mg total) by mouth daily. 90 tablet 3   No current facility-administered medications on file prior to visit.    BP (!) 168/82   Pulse 82   Temp 98.5 F (36.9 C) (Oral)   Ht 5' 3.75" (1.619 m)   Wt 167 lb (75.8 kg)   LMP 05/14/2000 (Approximate)   SpO2 95%    BMI 28.89 kg/m       Objective:   Physical Exam Vitals and nursing note reviewed.  Constitutional:      Appearance: Normal appearance.  Cardiovascular:     Rate and Rhythm: Normal rate and regular rhythm.     Pulses: Normal pulses.     Heart sounds: Normal heart sounds.  Pulmonary:     Effort: Pulmonary effort is normal.     Breath sounds: Normal breath sounds.  Musculoskeletal:        General: Normal range of motion.  Skin:    General: Skin is warm and dry.  Neurological:     General: No focal deficit present.     Mental Status: She is alert and oriented to person, place, and time.  Psychiatric:        Mood and Affect: Mood normal.        Behavior: Behavior normal.        Thought Content: Thought content normal.        Judgment: Judgment normal.       Assessment & Plan:  1. Type 2 diabetes mellitus with diabetic polyneuropathy, without long-term current use of insulin (HCC)  - POC HgB A1c- 7.2 - has improved  - Continue with metformin 1000 mg BID  - Follow up in three months   2. Essential hypertension - Not at goal  - Will have her increase her lisinopril from 20 mg to 16m. She will take two tabs of the 20 mg and send me her readings via mychart two weeks   CDorothyann Peng NP

## 2021-11-16 ENCOUNTER — Ambulatory Visit
Admission: RE | Admit: 2021-11-16 | Discharge: 2021-11-16 | Disposition: A | Discharge status: Home / Self Care | Payer: Medicare PPO | Source: Ambulatory Visit | Attending: Adult Health | Admitting: Adult Health

## 2021-11-16 DIAGNOSIS — Z1231 Encounter for screening mammogram for malignant neoplasm of breast: Secondary | ICD-10-CM

## 2022-01-01 DIAGNOSIS — H40033 Anatomical narrow angle, bilateral: Secondary | ICD-10-CM | POA: Diagnosis not present

## 2022-01-01 DIAGNOSIS — E119 Type 2 diabetes mellitus without complications: Secondary | ICD-10-CM | POA: Diagnosis not present

## 2022-01-01 LAB — HM DIABETES EYE EXAM

## 2022-01-25 ENCOUNTER — Encounter: Payer: Self-pay | Admitting: Adult Health

## 2022-01-26 ENCOUNTER — Telehealth (INDEPENDENT_AMBULATORY_CARE_PROVIDER_SITE_OTHER): Payer: Medicare PPO | Admitting: Adult Health

## 2022-01-26 ENCOUNTER — Encounter: Payer: Self-pay | Admitting: Adult Health

## 2022-01-26 VITALS — Ht 63.75 in | Wt 167.0 lb

## 2022-01-26 DIAGNOSIS — U071 COVID-19: Secondary | ICD-10-CM

## 2022-01-26 DIAGNOSIS — R051 Acute cough: Secondary | ICD-10-CM

## 2022-01-26 MED ORDER — NIRMATRELVIR/RITONAVIR (PAXLOVID)TABLET: 3.0000 | ORAL_TABLET | Freq: Two times a day (BID) | ORAL | 0 refills | Status: AC

## 2022-01-26 MED ORDER — BENZONATATE 200 MG PO CAPS: 200.0000 mg | ORAL_CAPSULE | Freq: Two times a day (BID) | ORAL | 0 refills | Status: DC | PRN

## 2022-01-26 MED ORDER — HYDROCODONE BIT-HOMATROP MBR 5-1.5 MG/5ML PO SOLN: 5.0000 mL | Freq: Three times a day (TID) | ORAL | 0 refills | Status: DC | PRN

## 2022-01-26 NOTE — Progress Notes (Signed)
Virtual Visit via Video Note  I connected with Erin Hatfield on 01/26/22 at 11:30 AM EDT by a video enabled telemedicine application and verified that I am speaking with the correct person using two identifiers.  Location patient: home Location provider:work or home office Persons participating in the virtual visit: patient, provider  I discussed the limitations of evaluation and management by telemedicine and the availability of in person appointments. The patient expressed understanding and agreed to proceed.   HPI: 71 year old female who  has a past medical history of Allergy, Bunion, left foot, Complication of anesthesia, Diabetes mellitus without complication ([place]), Family history of breast cancer, Family history of ovarian cancer, Hot flashes, Hypertension, and PONV (postoperative nausea and vomiting).  She is being evaluated today for covid 19 infection. She reports that she tested positive for Covid 19 this morning. Her symptoms started 2 days ago  Her symptoms include that of runny nose, dry cough, low grade fever, sore throat.   She denies wheezing or shortness   ROS: See pertinent positives and negatives per HPI.  Past Medical History:  Diagnosis Date   Allergy    Bunion, left foot    Complication of anesthesia    Diabetes mellitus without complication ([place])    type 2   Family history of breast cancer    Family history of ovarian cancer    Hot flashes    Hypertension    PONV (postoperative nausea and vomiting)     Past Surgical History:  Procedure Laterality Date   LAPAROSCOPIC BILATERAL SALPINGO OOPHERECTOMY Bilateral 10/13/2018   Procedure: LAPAROSCOPIC BILATERAL SALPINGO OOPHORECTOMY;  Surgeon: Megan Salon, MD;  Location: [place];  Service: Gynecology;  Laterality: Bilateral;   VAGINAL HYSTERECTOMY  2002   fibroids partial    Family History  Problem Relation Age of Onset   Diabetes Sister    Ovarian cancer Sister 74   Breast cancer  Mother 52   Lung cancer Father        Smoker   Diabetes Other    Hypertension Other    Diabetes Sister    Breast cancer Niece 42   Colon cancer Neg Hx        Current Outpatient Medications:    Accu-Chek Softclix Lancets lancets, 1 each by Other route daily at 2 PM. Use once daily Dx E11.9, Disp: 100 each, Rfl: 0   Alcohol Swabs (B-D SINGLE USE SWABS REGULAR) PADS, Use to clean finger before checking blood sugar 2 times daily, Disp: 100 each, Rfl: 1   amitriptyline (ELAVIL) 50 MG tablet, Take 1 tablet (50 mg total) by mouth at bedtime., Disp: 90 tablet, Rfl: 3   aspirin 81 MG tablet, Take 81 mg by mouth daily., Disp: , Rfl:    Blood Glucose Calibration (ACCU-CHEK GUIDE CONTROL) LIQD, 1 Bottle by In Vitro route as needed., Disp: 1 each, Rfl: 1   Blood Glucose Monitoring Suppl (ACCU-CHEK GUIDE ME) w/Device KIT, 2 Sticks by Does not apply route 3 times/day as needed-between meals & bedtime. Used to check blood sugar 2 times daily, Disp: 1 kit, Rfl: 0   cetirizine (ZYRTEC) 10 MG tablet, Take 10 mg by mouth as needed., Disp: , Rfl:    cholecalciferol (VITAMIN D) 1000 UNITS tablet, Take 1,000 Units by mouth daily., Disp: , Rfl:    Chromium-Cinnamon 100-500 MCG-MG CAPS, Take by mouth 2 (two) times daily., Disp: , Rfl:    fenofibrate (TRICOR) 145 MG tablet, Take 1 tablet (145 mg total)  by mouth daily., Disp: 90 tablet, Rfl: 3   fish oil-omega-3 fatty acids 1000 MG capsule, Take 1 g by mouth daily., Disp: , Rfl:    glucose blood (ACCU-CHEK GUIDE) test strip, Used to check blood sugar 2 times daily, Disp: 100 each, Rfl: 12   lisinopril (ZESTRIL) 20 MG tablet, Take 1 tablet (20 mg total) by mouth daily. (Patient taking differently: Take 40 mg by mouth daily.), Disp: 90 tablet, Rfl: 3   metFORMIN (GLUCOPHAGE) 1000 MG tablet, Take 1 tablet (1,000 mg total) by mouth 2 (two) times daily with a meal., Disp: 180 tablet, Rfl: 3   Multiple Vitamin (MULTIVITAMIN) tablet, Take 1 tablet by mouth daily., Disp: ,  Rfl:    atenolol (TENORMIN) 50 MG tablet, Take 1.5 tablets (75 mg total) by mouth daily., Disp: 135 tablet, Rfl: 3   simvastatin (ZOCOR) 5 MG tablet, Take 1 tablet (5 mg total) by mouth daily., Disp: 90 tablet, Rfl: 3  EXAM:  VITALS per patient if applicable:  GENERAL: alert, oriented, appears well and in no acute distress  HEENT: atraumatic, conjunttiva clear, no obvious abnormalities on inspection of external nose and ears  NECK: normal movements of the head and neck  LUNGS: on inspection no signs of respiratory distress, breathing rate appears normal, no obvious gross SOB, gasping or wheezing  CV: no obvious cyanosis  MS: moves all visible extremities without noticeable abnormality  PSYCH/NEURO: pleasant and cooperative, no obvious depression or anxiety, speech and thought processing grossly intact  ASSESSMENT AND PLAN:  Discussed the following assessment and plan:  1. COVID-19 virus infection  - nirmatrelvir/ritonavir EUA (PAXLOVID) 20 x 150 MG & 10 x 100MG TABS; Take 3 tablets by mouth 2 (two) times daily for 5 days. (Take nirmatrelvir 150 mg two tablets twice daily for 5 days and ritonavir 100 mg one tablet twice daily for 5 days) Patient GFR is 65  Dispense: 30 tablet; Refill: 0  2. Acute cough  - benzonatate (TESSALON) 200 MG capsule; Take 1 capsule (200 mg total) by mouth 2 (two) times daily as needed for cough.  Dispense: 20 capsule; Refill: 0 - HYDROcodone bit-homatropine (HYCODAN) 5-1.5 MG/5ML syrup; Take 5 mLs by mouth every 8 (eight) hours as needed for cough.  Dispense: 120 mL; Refill: 0      I discussed the assessment and treatment plan with the patient. The patient was provided an opportunity to ask questions and all were answered. The patient agreed with the plan and demonstrated an understanding of the instructions.   The patient was advised to call back or seek an in-person evaluation if the symptoms worsen or if the condition fails to improve as  anticipated.   [name], NP

## 2022-01-26 NOTE — Telephone Encounter (Signed)
Pt has been scheduled.  °

## 2022-02-05 ENCOUNTER — Ambulatory Visit: Payer: Medicare PPO

## 2022-02-05 ENCOUNTER — Ambulatory Visit (INDEPENDENT_AMBULATORY_CARE_PROVIDER_SITE_OTHER): Payer: Medicare PPO

## 2022-02-05 VITALS — Ht 62.0 in | Wt 165.0 lb

## 2022-02-05 DIAGNOSIS — Z0000 Encounter for general adult medical examination without abnormal findings: Secondary | ICD-10-CM | POA: Diagnosis not present

## 2022-02-05 NOTE — Patient Instructions (Signed)
Erin Hatfield , Thank you for taking time to come for your Medicare Wellness Visit. I appreciate your ongoing commitment to your health goals. Please review the following plan we discussed and let me know if I can assist you in the future.   Screening recommendations/referrals: Colonoscopy: completed 03/01/2015, due 02/28/2025 Mammogram: completed 11/16/2021, due 11/18/2022 Bone Density: completed 04/11/2020 Recommended yearly ophthalmology/optometry visit for glaucoma screening and checkup Recommended yearly dental visit for hygiene and checkup  Vaccinations: Influenza vaccine: due Pneumococcal vaccine: completed 04/26/2015 Tdap vaccine: due Shingles vaccine: needs second dose   Covid-19: 07/24/2019, 07/17/2019, 06/19/2019  Advanced directives: Advance directive discussed with you today.   Conditions/risks identified: none  Next appointment: Follow up in one year for your annual wellness visit    Preventive Care 65 Years and Older, Female Preventive care refers to lifestyle choices and visits with your health care provider that can promote health and wellness. What does preventive care include? A yearly physical exam. This is also called an annual well check. Dental exams once or twice a year. Routine eye exams. Ask your health care provider how often you should have your eyes checked. Personal lifestyle choices, including: Daily care of your teeth and gums. Regular physical activity. Eating a healthy diet. Avoiding tobacco and drug use. Limiting alcohol use. Practicing safe sex. Taking low-dose aspirin every day. Taking vitamin and mineral supplements as recommended by your health care provider. What happens during an annual well check? The services and screenings done by your health care provider during your annual well check will depend on your age, overall health, lifestyle risk factors, and family history of disease. Counseling  Your health care provider may ask you questions  about your: Alcohol use. Tobacco use. Drug use. Emotional well-being. Home and relationship well-being. Sexual activity. Eating habits. History of falls. Memory and ability to understand (cognition). Work and work Statistician. Reproductive health. Screening  You may have the following tests or measurements: Height, weight, and BMI. Blood pressure. Lipid and cholesterol levels. These may be checked every 5 years, or more frequently if you are over 72 years old. Skin check. Lung cancer screening. You may have this screening every year starting at age 18 if you have a 30-pack-year history of smoking and currently smoke or have quit within the past 15 years. Fecal occult blood test (FOBT) of the stool. You may have this test every year starting at age 44. Flexible sigmoidoscopy or colonoscopy. You may have a sigmoidoscopy every 5 years or a colonoscopy every 10 years starting at age 26. Hepatitis C blood test. Hepatitis B blood test. Sexually transmitted disease (STD) testing. Diabetes screening. This is done by checking your blood sugar (glucose) after you have not eaten for a while (fasting). You may have this done every 1-3 years. Bone density scan. This is done to screen for osteoporosis. You may have this done starting at age 36. Mammogram. This may be done every 1-2 years. Talk to your health care provider about how often you should have regular mammograms. Talk with your health care provider about your test results, treatment options, and if necessary, the need for more tests. Vaccines  Your health care provider may recommend certain vaccines, such as: Influenza vaccine. This is recommended every year. Tetanus, diphtheria, and acellular pertussis (Tdap, Td) vaccine. You may need a Td booster every 10 years. Zoster vaccine. You may need this after age 28. Pneumococcal 13-valent conjugate (PCV13) vaccine. One dose is recommended after age 93. Pneumococcal polysaccharide (PPSV23)  vaccine. One dose is recommended after age 25. Talk to your health care provider about which screenings and vaccines you need and how often you need them. This information is not intended to replace advice given to you by your health care provider. Make sure you discuss any questions you have with your health care provider. Document Released: 05/27/2015 Document Revised: 01/18/2016 Document Reviewed: 03/01/2015 Elsevier Interactive Patient Education  2017 [employer] Prevention in the Home Falls can cause injuries. They can happen to people of all ages. There are many things you can do to make your home safe and to help prevent falls. What can I do on the outside of my home? Regularly fix the edges of walkways and driveways and fix any cracks. Remove anything that might make you trip as you walk through a door, such as a raised step or threshold. Trim any bushes or trees on the path to your home. Use bright outdoor lighting. Clear any walking paths of anything that might make someone trip, such as rocks or tools. Regularly check to see if handrails are loose or broken. Make sure that both sides of any steps have handrails. Any raised decks and porches should have guardrails on the edges. Have any leaves, snow, or ice cleared regularly. Use sand or salt on walking paths during winter. Clean up any spills in your garage right away. This includes oil or grease spills. What can I do in the bathroom? Use night lights. Install grab bars by the toilet and in the tub and shower. Do not use towel bars as grab bars. Use non-skid mats or decals in the tub or shower. If you need to sit down in the shower, use a plastic, non-slip stool. Keep the floor dry. Clean up any water that spills on the floor as soon as it happens. Remove soap buildup in the tub or shower regularly. Attach bath mats securely with double-sided non-slip rug tape. Do not have throw rugs and other things on the floor that  can make you trip. What can I do in the bedroom? Use night lights. Make sure that you have a light by your bed that is easy to reach. Do not use any sheets or blankets that are too big for your bed. They should not hang down onto the floor. Have a firm chair that has side arms. You can use this for support while you get dressed. Do not have throw rugs and other things on the floor that can make you trip. What can I do in the kitchen? Clean up any spills right away. Avoid walking on wet floors. Keep items that you use a lot in easy-to-reach places. If you need to reach something above you, use a strong step stool that has a grab bar. Keep electrical cords out of the way. Do not use floor polish or wax that makes floors slippery. If you must use wax, use non-skid floor wax. Do not have throw rugs and other things on the floor that can make you trip. What can I do with my stairs? Do not leave any items on the stairs. Make sure that there are handrails on both sides of the stairs and use them. Fix handrails that are broken or loose. Make sure that handrails are as long as the stairways. Check any carpeting to make sure that it is firmly attached to the stairs. Fix any carpet that is loose or worn. Avoid having throw rugs at the top or bottom of  the stairs. If you do have throw rugs, attach them to the floor with carpet tape. Make sure that you have a light switch at the top of the stairs and the bottom of the stairs. If you do not have them, ask someone to add them for you. What else can I do to help prevent falls? Wear shoes that: Do not have high heels. Have rubber bottoms. Are comfortable and fit you well. Are closed at the toe. Do not wear sandals. If you use a stepladder: Make sure that it is fully opened. Do not climb a closed stepladder. Make sure that both sides of the stepladder are locked into place. Ask someone to hold it for you, if possible. Clearly mark and make sure that you  can see: Any grab bars or handrails. First and last steps. Where the edge of each step is. Use tools that help you move around (mobility aids) if they are needed. These include: Canes. Walkers. Scooters. Crutches. Turn on the lights when you go into a dark area. Replace any light bulbs as soon as they burn out. Set up your furniture so you have a clear path. Avoid moving your furniture around. If any of your floors are uneven, fix them. If there are any pets around you, be aware of where they are. Review your medicines with your doctor. Some medicines can make you feel dizzy. This can increase your chance of falling. Ask your doctor what other things that you can do to help prevent falls. This information is not intended to replace advice given to you by your health care provider. Make sure you discuss any questions you have with your health care provider. Document Released: 02/24/2009 Document Revised: 10/06/2015 Document Reviewed: 06/04/2014 Elsevier Interactive Patient Education  2017 [employer].

## 2022-02-05 NOTE — Progress Notes (Signed)
I connected with Erin Hatfield today by telephone and verified that I am speaking with the correct person using two identifiers. Location patient: home Location provider: work Persons participating in the virtual visit: [place], Frances LPN.   I discussed the limitations, risks, security and privacy concerns of performing an evaluation and management service by telephone and the availability of in person appointments. I also discussed with the patient that there may be a patient responsible charge related to this service. The patient expressed understanding and verbally consented to this telephonic visit.    Interactive audio and video telecommunications were attempted between this provider and patient, however failed, due to patient having technical difficulties OR patient did not have access to video capability.  We continued and completed visit with audio only.     Vital signs may be patient reported or missing.  Subjective:   Erin Hatfield is a 71 y.o. female who presents for Medicare Annual (Subsequent) preventive examination.  Review of Systems     Cardiac Risk Factors include: advanced age (>29mn, >>77women);diabetes mellitus;hypertension;obesity (BMI >30kg/m2)     Objective:    Today's Vitals   02/05/22 0811  Weight: 165 lb (74.8 kg)  Height: _0  (1.575 m)   Body mass index is 30.18 kg/m.     02/05/2022    8:15 AM 02/02/2021    8:28 AM 12/22/2019    2:53 PM 10/13/2018    8:24 AM  Advanced Directives  Does Patient Have a Medical Advance Directive? No No No No  Would patient like information on creating a medical advance directive?  No - Patient declined No - Patient declined No - Patient declined    Current Medications (verified) Outpatient Encounter Medications as of 02/05/2022  Medication Sig   Accu-Chek Softclix Lancets lancets 1 each by Other route daily at 2 PM. Use once daily Dx E11.9   Alcohol Swabs (B-D SINGLE USE SWABS REGULAR) PADS Use  to clean finger before checking blood sugar 2 times daily   amitriptyline (ELAVIL) 50 MG tablet Take 1 tablet (50 mg total) by mouth at bedtime.   aspirin 81 MG tablet Take 81 mg by mouth daily.   benzonatate (TESSALON) 200 MG capsule Take 1 capsule (200 mg total) by mouth 2 (two) times daily as needed for cough.   Blood Glucose Calibration (ACCU-CHEK GUIDE CONTROL) LIQD 1 Bottle by In Vitro route as needed.   Blood Glucose Monitoring Suppl (ACCU-CHEK GUIDE ME) w/Device KIT 2 Sticks by Does not apply route 3 times/day as needed-between meals & bedtime. Used to check blood sugar 2 times daily   cetirizine (ZYRTEC) 10 MG tablet Take 10 mg by mouth as needed.   cholecalciferol (VITAMIN D) 1000 UNITS tablet Take 1,000 Units by mouth daily.   Chromium-Cinnamon 100-500 MCG-MG CAPS Take by mouth 2 (two) times daily.   fenofibrate (TRICOR) 145 MG tablet Take 1 tablet (145 mg total) by mouth daily.   fish oil-omega-3 fatty acids 1000 MG capsule Take 1 g by mouth daily.   glucose blood (ACCU-CHEK GUIDE) test strip Used to check blood sugar 2 times daily   HYDROcodone bit-homatropine (HYCODAN) 5-1.5 MG/5ML syrup Take 5 mLs by mouth every 8 (eight) hours as needed for cough.   lisinopril (ZESTRIL) 20 MG tablet Take 1 tablet (20 mg total) by mouth daily. (Patient taking differently: Take 40 mg by mouth daily.)   metFORMIN (GLUCOPHAGE) 1000 MG tablet Take 1 tablet (1,000 mg total) by mouth 2 (two) times daily  with a meal.   Multiple Vitamin (MULTIVITAMIN) tablet Take 1 tablet by mouth daily.   atenolol (TENORMIN) 50 MG tablet Take 1.5 tablets (75 mg total) by mouth daily.   simvastatin (ZOCOR) 5 MG tablet Take 1 tablet (5 mg total) by mouth daily.   No facility-administered encounter medications on file as of 02/05/2022.    Allergies (verified) Patient has no known allergies.   History: Past Medical History:  Diagnosis Date   Allergy    Bunion, left foot    Complication of anesthesia    Diabetes  mellitus without complication ([place])    type 2   Family history of breast cancer    Family history of ovarian cancer    Hot flashes    Hypertension    PONV (postoperative nausea and vomiting)    Past Surgical History:  Procedure Laterality Date   LAPAROSCOPIC BILATERAL SALPINGO OOPHERECTOMY Bilateral 10/13/2018   Procedure: LAPAROSCOPIC BILATERAL SALPINGO OOPHORECTOMY;  Surgeon: Megan Salon, MD;  Location: [place];  Service: Gynecology;  Laterality: Bilateral;   VAGINAL HYSTERECTOMY  2002   fibroids partial   Family History  Problem Relation Age of Onset   Diabetes Sister    Ovarian cancer Sister 64   Breast cancer Mother 110   Lung cancer Father        Smoker   Diabetes Other    Hypertension Other    Diabetes Sister    Breast cancer Niece 93   Colon cancer Neg Hx    Social History   Socioeconomic History   Marital status: Married    Spouse name: Not on file   Number of children: Not on file   Years of education: Not on file   Highest education level: Not on file  Occupational History   Not on file  Tobacco Use   Smoking status: Never   Smokeless tobacco: Never  Vaping Use   Vaping Use: Never used  Substance and Sexual Activity   Alcohol use: No   Drug use: No   Sexual activity: Not Currently  Other Topics Concern   Not on file  Social History Narrative   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, environmental   Married for 23 years   One daughter ( [place])    One grandson   Social Determinants of Health   Financial Resource Strain: [place]  (02/05/2022)   Overall Financial Resource Strain (CARDIA)    Difficulty of Paying Living Expenses: Not hard at all  Food Insecurity: No Food Insecurity (02/05/2022)   Hunger Vital Sign    Worried About Running Out of Food in the Last Year: Never true    [place] in the Last Year: Never true  Transportation Needs: No Transportation Needs  (02/05/2022)   PRAPARE - Hydrologist (Medical): No    Lack of Transportation (Non-Medical): No  Physical Activity: Inactive (02/05/2022)   Exercise Vital Sign    Days of Exercise per Week: 0 days    Minutes of Exercise per Session: 0 min  Stress: No Stress Concern Present (02/05/2022)   [place]    Feeling of Stress : Not at all  Social Connections: Moderately Integrated (02/02/2021)   Social Connection and Isolation Panel [NHANES]    Frequency of Communication with Friends and Family: Three times a week    Frequency of Social Gatherings with Friends and  Family: Three times a week    Attends Religious Services: More than 4 times per year    Active Member of Clubs or Organizations: No    Attends Archivist Meetings: Never    Marital Status: Married    Tobacco Counseling Counseling given: Not Answered   Clinical Intake:  Pre-visit preparation completed: Yes  Pain : No/denies pain     Nutritional Status: BMI > 30  Obese Nutritional Risks: None Diabetes: Yes  How often do you need to have someone help you when you read instructions, pamphlets, or other written materials from your doctor or pharmacy?: 1 - Never What is the last grade level you completed in school?: 12th grade  Diabetic? Yes Nutrition Risk Assessment:  Has the patient had any N/V/D within the last 2 months?  No  Does the patient have any non-healing wounds?  No  Has the patient had any unintentional weight loss or weight gain?  No   Diabetes:  Is the patient diabetic?  Yes  If diabetic, was a CBG obtained today?  No  Did the patient bring in their glucometer from home?  No  How often do you monitor your CBG's? daily.   Financial Strains and Diabetes Management:  Are you having any financial strains with the device, your supplies or your medication? No .  Does the patient want to be seen by Chronic  Care Management for management of their diabetes?  No  Would the patient like to be referred to a Nutritionist or for Diabetic Management?  No   Diabetic Exams:  Diabetic Eye Exam: Completed 12/2021 Diabetic Foot Exam: Overdue, Pt has been advised about the importance in completing this exam. Pt is scheduled for diabetic foot exam on next appointment.   Interpreter Needed?: No  Information entered by :: NAllen LPN   Activities of Daily Living    02/05/2022    8:16 AM  In your present state of health, do you have any difficulty performing the following activities:  Hearing? 0  Vision? 0  Difficulty concentrating or making decisions? 0  Walking or climbing stairs? 0  Dressing or bathing? 0  Doing errands, shopping? 0  Preparing Food and eating ? N  Using the Toilet? N  In the past six months, have you accidently leaked urine? N  Do you have problems with loss of bowel control? N  Managing your Medications? N  Managing your Finances? N  Housekeeping or managing your Housekeeping? N    Patient Care Team: [name], NP as PCP - General (Family Medicine)  Indicate any recent Medical Services you may have received from other than [facility] providers in the past year (date may be approximate).     Assessment:   This is a routine wellness examination for Erin Hatfield.  Hearing/Vision screen Vision Screening - Comments:: Regular eye exams, [facility]  Dietary issues and exercise activities discussed: Current Exercise Habits: The patient does not participate in regular exercise at present   Goals Addressed             This Visit's Progress    Patient Stated       02/05/2022, keep blood sugar under control       Depression Screen    02/05/2022    8:16 AM 02/02/2021    8:30 AM 02/02/2021    8:27 AM 06/28/2020    1:08 PM 12/22/2019    2:56 PM 06/05/2018    7:33 AM 05/02/2017    7:00 AM  PHQ 2/9 Scores  PHQ - 2 Score 0 0 0 0 0 0 0  PHQ- 9 Score     0      Fall Risk     02/05/2022    8:16 AM 02/02/2021    8:30 AM 06/28/2020    1:08 PM 12/22/2019    2:54 PM 06/05/2018    7:33 AM  Fall Risk   Falls in the past year? 0 0 0  0  Number falls in past yr: 0 0  0   Injury with Fall? 0 0  0   Risk for fall due to : Medication side effect No Fall Risks  Medication side effect   Follow up Falls prevention discussed;Education provided;Falls evaluation completed Falls evaluation completed       FALL RISK PREVENTION PERTAINING TO THE HOME:  Any stairs in or around the home? Yes  If so, are there any without handrails? No  Home free of loose throw rugs in walkways, pet beds, electrical cords, etc? Yes  Adequate lighting in your home to reduce risk of falls? Yes   ASSISTIVE DEVICES UTILIZED TO PREVENT FALLS:  Life alert? No  Use of a cane, walker or w/c? No  Grab bars in the bathroom? No  Shower chair or bench in shower? Yes  Elevated toilet seat or a handicapped toilet? Yes   TIMED UP AND GO:  Was the test performed? No .      Cognitive Function:        02/05/2022    8:17 AM 12/22/2019    2:58 PM  6CIT Screen  What Year? 0 points 0 points  What month? 0 points 0 points  What time? 0 points 0 points  Count back from 20 0 points 0 points  Months in reverse 0 points 0 points  Repeat phrase 0 points 0 points  Total Score 0 points 0 points    Immunizations Immunization History  Administered Date(s) Administered   Fluad Quad(high Dose 65+) 02/07/2019, 05/11/2020, 02/28/2021   Influenza Split 02/13/2011, 03/24/2012   Influenza Whole 02/25/2008, 02/02/2009   Influenza, High Dose Seasonal PF 04/26/2015, 05/01/2016, 05/02/2017, 03/20/2018   Influenza,inj,Quad PF,6+ Mos 04/16/2013, 01/06/2014   Moderna SARS-COV2 Booster Vaccination 07/24/2019   Moderna Sars-Covid-2 Vaccination 06/19/2019, 07/17/2019   Pneumococcal Conjugate-13 04/20/2014   Pneumococcal Polysaccharide-23 04/26/2015   Td 12/11/2005   Zoster, Live 04/11/2011    TDAP status: Due,  Education has been provided regarding the importance of this vaccine. Advised may receive this vaccine at local pharmacy or Health Dept. Aware to provide a copy of the vaccination record if obtained from local pharmacy or Health Dept. Verbalized acceptance and understanding.  Flu Vaccine status: Due, Education has been provided regarding the importance of this vaccine. Advised may receive this vaccine at local pharmacy or Health Dept. Aware to provide a copy of the vaccination record if obtained from local pharmacy or Health Dept. Verbalized acceptance and understanding.  Pneumococcal vaccine status: Up to date  Covid-19 vaccine status: Completed vaccines  Qualifies for Shingles Vaccine? Yes   Zostavax completed Yes   Shingrix Completed?: needs second dose  Screening Tests Health Maintenance  Topic Date Due   Zoster Vaccines- Shingrix (1 of 2) Never done   TETANUS/TDAP  12/12/2015   Diabetic kidney evaluation - Urine ACR  05/02/2018   COVID-19 Vaccine (3 - Moderna series) 09/18/2019   OPHTHALMOLOGY EXAM  12/12/2020   INFLUENZA VACCINE  12/12/2021   FOOT EXAM  12/27/2021   HEMOGLOBIN  A1C  04/28/2022   Diabetic kidney evaluation - GFR measurement  07/03/2022   MAMMOGRAM  11/17/2022   COLONOSCOPY (Pts 45-80yr Insurance coverage will need to be confirmed)  02/28/2025   Pneumonia Vaccine 71 Years old  Completed   DEXA SCAN  Completed   Hepatitis C Screening  Completed   HPV VACCINES  Aged Out    Health Maintenance  Health Maintenance Due  Topic Date Due   Zoster Vaccines- Shingrix (1 of 2) Never done   TETANUS/TDAP  12/12/2015   Diabetic kidney evaluation - Urine ACR  05/02/2018   COVID-19 Vaccine (3 - Moderna series) 09/18/2019   OPHTHALMOLOGY EXAM  12/12/2020   INFLUENZA VACCINE  12/12/2021   FOOT EXAM  12/27/2021    Colorectal cancer screening: Type of screening: Colonoscopy. Completed 03/01/2015. Repeat every 10 years  Mammogram status: Completed 11/16/2021. Repeat every  year  Bone Density status: Completed 04/11/2020.   Lung Cancer Screening: (Low Dose CT Chest recommended if Age 11024-80years, 30 pack-year currently smoking OR have quit w/in 15years.) does not qualify.   Lung Cancer Screening Referral: no  Additional Screening:  Hepatitis C Screening: does qualify; Completed 06/05/2018  Vision Screening: Recommended annual ophthalmology exams for early detection of glaucoma and other disorders of the eye. Is the patient up to date with their annual eye exam?  Yes  Who is the provider or what is the name of the office in which the patient attends annual eye exams? [facility] If pt is not established with a provider, would they like to be referred to a provider to establish care? No .   Dental Screening: Recommended annual dental exams for proper oral hygiene  Community Resource Referral / Chronic Care Management: CRR required this visit?  No   CCM required this visit?  No      Plan:     I have personally reviewed and noted the following in the patient's chart:   Medical and social history Use of alcohol, tobacco or illicit drugs  Current medications and supplements including opioid prescriptions. Patient is currently taking opioid prescriptions. Information provided to patient regarding non-opioid alternatives. Patient advised to discuss non-opioid treatment plan with their provider. Functional ability and status Nutritional status Physical activity Advanced directives List of other physicians Hospitalizations, surgeries, and ER visits in previous 12 months Vitals Screenings to include cognitive, depression, and falls Referrals and appointments  In addition, I have reviewed and discussed with patient certain preventive protocols, quality metrics, and best practice recommendations. A written personalized care plan for preventive services as well as general preventive health recommendations were provided to patient.     NKellie Simmering  LPN   [id number]  Nurse Notes: none  Due to this being a virtual visit, the after visit summary with patients personalized plan was offered to patient via mail or my-chart.  Patient would like to access on my-chart

## 2022-02-06 ENCOUNTER — Ambulatory Visit (INDEPENDENT_AMBULATORY_CARE_PROVIDER_SITE_OTHER): Payer: Medicare PPO | Admitting: *Deleted

## 2022-02-06 DIAGNOSIS — Z23 Encounter for immunization: Secondary | ICD-10-CM | POA: Diagnosis not present

## 2022-02-09 ENCOUNTER — Ambulatory Visit: Payer: Medicare PPO

## 2022-02-14 ENCOUNTER — Encounter: Payer: Self-pay | Admitting: Adult Health

## 2022-02-14 ENCOUNTER — Ambulatory Visit: Payer: Medicare PPO | Admitting: Adult Health

## 2022-02-14 VITALS — BP 132/86 | HR 90 | Temp 98.5°F | Wt 164.0 lb

## 2022-02-14 DIAGNOSIS — E1142 Type 2 diabetes mellitus with diabetic polyneuropathy: Secondary | ICD-10-CM | POA: Diagnosis not present

## 2022-02-14 DIAGNOSIS — I10 Essential (primary) hypertension: Secondary | ICD-10-CM | POA: Diagnosis not present

## 2022-02-14 LAB — POCT GLYCOSYLATED HEMOGLOBIN (HGB A1C): Hemoglobin A1C: 7.1 % — AB (ref 4.0–5.6)

## 2022-02-14 MED ORDER — LISINOPRIL 40 MG PO TABS: 40.0000 mg | ORAL_TABLET | Freq: Every day | ORAL | 3 refills | Status: DC

## 2022-02-14 NOTE — Patient Instructions (Signed)
Your A1c was 7.1   Please follow up after February 20th for your physical.

## 2022-02-14 NOTE — Progress Notes (Signed)
Subjective:    Patient ID: Erin Hatfield, female    DOB: Oct 08, 1950, 71 y.o.   MRN: [id number]  HPI 71 year old female who  has a past medical history of Allergy, Bunion, left foot, Complication of anesthesia, Diabetes mellitus without complication ([place]), Family history of breast cancer, Family history of ovarian cancer, Hot flashes, Hypertension, and PONV (postoperative nausea and vomiting).  She presents to the office today for 83-monthfollow-up regarding diabetes and hypertension  Diabetes mellitus type 2-currently managed with metformin 1000 mg twice daily.  She does monitor her blood sugars at home and reports readings in the 120s to 150s.  She denies episodes of hypoglycemia.  She does take amitriptyline 50 mg nightly for diabetic neuropathy and feels as though this controls her symptoms well Lab Results  Component Value Date   HGBA1C 7.1 (A) 02/14/2022   Hypertension-managed with atenolol 75 mg daily and lisinopril 40 mg daily.  She denies dizziness, lightheadedness, chest pain, shortness of breath.  She does check her blood pressure at home with readings in the 1417Eto 1081Ksystolic. BP Readings from Last 3 Encounters:  02/14/22 132/86  10/27/21 (!) 156/94  06/30/21 138/88   Review of Systems See HPI  Past Medical History:  Diagnosis Date   Allergy    Bunion, left foot    Complication of anesthesia    Diabetes mellitus [place] complication ([place]    type 2   Family history of breast cancer    Family history of ovarian cancer    Hot flashes    Hypertension    PONV (postoperative nausea and vomiting)     Social History   Socioeconomic History   Marital status: Married    Spouse name: Not on file   Number of children: Not on file   Years of education: Not on file   Highest education level: Not on file  Occupational History   Not on file  Tobacco Use   Smoking status: Never   Smokeless tobacco: Never  Vaping Use   Vaping Use: Never used  Substance and  Sexual Activity   Alcohol use: No   Drug use: No   Sexual activity: Not Currently  Other Topics Concern   Not on file  Social History Narrative   [place]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, environmental  Married for 23 years   One daughter ( [place]    One grandson   Social Determinants of Health   Financial Resource Strain: Low Risk  (02/05/2022)   Overall Financial Resource Strain (CARDIA)    Difficulty of Paying Living Expenses: Not hard at all  Food Insecurity: No Food Insecurity (02/05/2022)   Hunger Vital Sign    Worried About Running Out of Food in the Last Year: Never true    Ran Out of Food in the Last Year: Never true  Transportation Needs: No Transportation Needs (02/05/2022)   PRAPARE - THydrologist(Medical): No    Lack of Transportation (Non-Medical): No  Physical Activity: Inactive (02/05/2022)   Exercise Vital Sign    Days of Exercise per Week: 0 days    Minutes of Exercise per Session: 0 min  Stress: No Stress Concern Present (02/05/2022)   [place]   Feeling of Stress : Not at all  Social Connections: Moderately Integrated (02/02/2021)   Social Connection and Isolation Panel [NHANES]  Frequency of Communication with Friends and Family: Three times a week    Frequency of Social Gatherings with Friends and Family: Three times a week    Attends Religious Services: More than 4 times per year    Active Member of Clubs or Organizations: No    Attends Archivist Meetings: Never    Marital Status: Married  Human resources officer Violence: Not At Risk (02/02/2021)   Humiliation, Afraid, Rape, and Kick questionnaire    Fear of Current or Ex-Partner: No    Emotionally Abused: No    Physically Abused: No    Sexually Abused: No    Past Surgical History:  Procedure Laterality Date   LAPAROSCOPIC BILATERAL SALPINGO  OOPHERECTOMY Bilateral 10/13/2018   Procedure: LAPAROSCOPIC BILATERAL SALPINGO OOPHORECTOMY;  Surgeon: Megan Salon, MD;  Location: [facility];  Service: Gynecology;  Laterality: Bilateral;   VAGINAL HYSTERECTOMY  2002   fibroids partial    Family History  Problem Relation Age of Onset   Diabetes Sister    Ovarian cancer Sister 22   Breast cancer Mother 49   Lung cancer Father        Smoker   Diabetes Other    Hypertension Other    Diabetes Sister    Breast cancer Niece 50   Colon cancer Neg Hx     No Known Allergies  Current Outpatient Medications on File Prior to Visit  Medication Sig Dispense Refill   Accu-Chek Softclix Lancets lancets 1 each by Other route daily at 2 PM. Use once daily Dx E11.9 100 each 0   Alcohol Swabs (B-D SINGLE USE SWABS REGULAR) PADS Use to clean finger before checking blood sugar 2 times daily 100 each 1   amitriptyline (ELAVIL) 50 MG tablet Take 1 tablet (50 mg total) by mouth at bedtime. 90 tablet 3   aspirin 81 MG tablet Take 81 mg by mouth daily.     Blood Glucose Calibration (ACCU-CHEK GUIDE CONTROL) LIQD 1 Bottle by In Vitro route as needed. 1 each 1   Blood Glucose Monitoring Suppl (ACCU-CHEK GUIDE ME) w/Device KIT 2 Sticks by Does not apply route 3 times/day as needed-between meals & bedtime. Used to check blood sugar 2 times daily 1 kit 0   cetirizine (ZYRTEC) 10 MG tablet Take 10 mg by mouth as needed.     cholecalciferol (VITAMIN D) 1000 UNITS tablet Take 1,000 Units by mouth daily.     Chromium-Cinnamon 100-500 MCG-MG CAPS Take by mouth 2 (two) times daily.     fenofibrate (TRICOR) 145 MG tablet Take 1 tablet (145 mg total) by mouth daily. 90 tablet 3   fish oil-omega-3 fatty acids 1000 MG capsule Take 1 g by mouth daily.     glucose blood (ACCU-CHEK GUIDE) test strip Used to check blood sugar 2 times daily 100 each 12   metFORMIN (GLUCOPHAGE) 1000 MG tablet Take 1 tablet (1,000 mg total) by mouth 2 (two) times daily with a  meal. 180 tablet 3   Multiple Vitamin (MULTIVITAMIN) tablet Take 1 tablet by mouth daily.     atenolol (TENORMIN) 50 MG tablet Take 1.5 tablets (75 mg total) by mouth daily. 135 tablet 3   simvastatin (ZOCOR) 5 MG tablet Take 1 tablet (5 mg total) by mouth daily. 90 tablet 3   No current facility-administered medications on file prior to visit.    BP 132/86 (BP Location: Left Arm, Patient Position: Sitting, Cuff Size: Normal)   Pulse 90   Temp 98.5  F (36.9 C) (Oral)   Wt 164 lb (74.4 kg)   LMP 05/14/2000 (Approximate)   SpO2 98%   BMI 30.00 kg/m       Objective:   Physical Exam Vitals and nursing note reviewed.  Constitutional:      Appearance: Normal appearance.  Cardiovascular:     Rate and Rhythm: Normal rate and regular rhythm.     Pulses: Normal pulses.     Heart sounds: Normal heart sounds.  Pulmonary:     Effort: Pulmonary effort is normal.     Breath sounds: Normal breath sounds.  Skin:    General: Skin is warm and dry.  Neurological:     General: No focal deficit present.     Mental Status: She is alert and oriented to person, place, and time.  Psychiatric:        Mood and Affect: Mood normal.        Behavior: Behavior normal.        Thought Content: Thought content normal.        Judgment: Judgment normal.       Assessment & Plan:  1. Type 2 diabetes mellitus with diabetic polyneuropathy, without long-term current use of insulin (HCC)  - POCT glycosylated hemoglobin (Hb A1C)- 7.1  - Has improved slightly.  - lisinopril (ZESTRIL) 40 MG tablet; Take 1 tablet (40 mg total) by mouth daily.  Dispense: 90 tablet; Refill: 3 - Follow up for CPE in 06/2022  2. Essential hypertension - Well controlled.  - No change in medications  - lisinopril (ZESTRIL) 40 MG tablet; Take 1 tablet (40 mg total) by mouth daily.  Dispense: 90 tablet; Refill: 3   [name], NP

## 2022-03-14 DIAGNOSIS — B351 Tinea unguium: Secondary | ICD-10-CM | POA: Diagnosis not present

## 2022-03-14 DIAGNOSIS — E1142 Type 2 diabetes mellitus with diabetic polyneuropathy: Secondary | ICD-10-CM | POA: Diagnosis not present

## 2022-03-14 DIAGNOSIS — L609 Nail disorder, unspecified: Secondary | ICD-10-CM | POA: Diagnosis not present

## 2022-03-16 ENCOUNTER — Other Ambulatory Visit: Payer: Self-pay | Admitting: Adult Health

## 2022-03-16 DIAGNOSIS — E139 Other specified diabetes mellitus without complications: Secondary | ICD-10-CM

## 2022-03-26 DIAGNOSIS — L601 Onycholysis: Secondary | ICD-10-CM | POA: Diagnosis not present

## 2022-03-26 DIAGNOSIS — L814 Other melanin hyperpigmentation: Secondary | ICD-10-CM | POA: Diagnosis not present

## 2022-04-12 DIAGNOSIS — E1142 Type 2 diabetes mellitus with diabetic polyneuropathy: Secondary | ICD-10-CM | POA: Diagnosis not present

## 2022-04-12 DIAGNOSIS — L603 Nail dystrophy: Secondary | ICD-10-CM | POA: Diagnosis not present

## 2022-06-28 ENCOUNTER — Other Ambulatory Visit: Payer: Self-pay | Admitting: Adult Health

## 2022-06-28 DIAGNOSIS — I10 Essential (primary) hypertension: Secondary | ICD-10-CM

## 2022-06-28 DIAGNOSIS — E1140 Type 2 diabetes mellitus with diabetic neuropathy, unspecified: Secondary | ICD-10-CM

## 2022-06-28 NOTE — Telephone Encounter (Signed)
Okay for refill?  

## 2022-07-05 ENCOUNTER — Encounter: Payer: Self-pay | Admitting: Adult Health

## 2022-07-05 ENCOUNTER — Ambulatory Visit (INDEPENDENT_AMBULATORY_CARE_PROVIDER_SITE_OTHER): Payer: Medicare PPO | Admitting: Adult Health

## 2022-07-05 ENCOUNTER — Other Ambulatory Visit: Payer: Self-pay | Admitting: Adult Health

## 2022-07-05 VITALS — BP 138/80 | HR 76 | Temp 97.6°F | Ht 62.0 in | Wt 165.0 lb

## 2022-07-05 DIAGNOSIS — I10 Essential (primary) hypertension: Secondary | ICD-10-CM

## 2022-07-05 DIAGNOSIS — E1140 Type 2 diabetes mellitus with diabetic neuropathy, unspecified: Secondary | ICD-10-CM

## 2022-07-05 DIAGNOSIS — E559 Vitamin D deficiency, unspecified: Secondary | ICD-10-CM

## 2022-07-05 DIAGNOSIS — E1142 Type 2 diabetes mellitus with diabetic polyneuropathy: Secondary | ICD-10-CM | POA: Diagnosis not present

## 2022-07-05 DIAGNOSIS — E782 Mixed hyperlipidemia: Secondary | ICD-10-CM | POA: Diagnosis not present

## 2022-07-05 DIAGNOSIS — Z0000 Encounter for general adult medical examination without abnormal findings: Secondary | ICD-10-CM

## 2022-07-05 LAB — LIPID PANEL
Cholesterol: 145 mg/dL (ref 0–200)
HDL: 39.1 mg/dL (ref >39.00)
LDL Cholesterol: 73 mg/dL (ref 0–99)
NonHDL: 105.73
Total CHOL/HDL Ratio: 4
Triglycerides: 165 mg/dL — ABNORMAL HIGH (ref 0.0–149.0)
VLDL: 33 mg/dL (ref 0.0–40.0)

## 2022-07-05 LAB — COMPREHENSIVE METABOLIC PANEL
ALT: 15 U/L (ref 0–35)
AST: 19 U/L (ref 0–37)
Albumin: 4.6 g/dL (ref 3.5–5.2)
Alkaline Phosphatase: 40 U/L (ref 39–117)
BUN: 12 mg/dL (ref 6–23)
CO2: 28 mEq/L (ref 19–32)
Calcium: 10.8 mg/dL — ABNORMAL HIGH (ref 8.4–10.5)
Chloride: 103 mEq/L (ref 96–112)
Creatinine, Ser: 1.02 mg/dL (ref 0.40–1.20)
GFR: 54.58 mL/min — ABNORMAL LOW (ref >60.00)
Glucose, Bld: 169 mg/dL — ABNORMAL HIGH (ref 70–99)
Potassium: 4.6 mEq/L (ref 3.5–5.1)
Sodium: 140 mEq/L (ref 135–145)
Total Bilirubin: 0.4 mg/dL (ref 0.2–1.2)
Total Protein: 7.4 g/dL (ref 6.0–8.3)

## 2022-07-05 LAB — CBC WITH DIFFERENTIAL/PLATELET
Basophils Absolute: 0.1 10*3/uL (ref 0.0–0.1)
Basophils Relative: 0.8 % (ref 0.0–3.0)
Eosinophils Absolute: 0.2 10*3/uL (ref 0.0–0.7)
Eosinophils Relative: 2.8 % (ref 0.0–5.0)
HCT: 36.2 % (ref 36.0–46.0)
Hemoglobin: 11.7 g/dL — ABNORMAL LOW (ref 12.0–15.0)
Lymphocytes Relative: 51.6 % — ABNORMAL HIGH (ref 12.0–46.0)
Lymphs Abs: 3.7 10*3/uL (ref 0.7–4.0)
MCHC: 32.4 g/dL (ref 30.0–36.0)
MCV: 83.7 fl (ref 78.0–100.0)
Monocytes Absolute: 0.7 10*3/uL (ref 0.1–1.0)
Monocytes Relative: 9.2 % (ref 3.0–12.0)
Neutro Abs: 2.6 10*3/uL (ref 1.4–7.7)
Neutrophils Relative %: 35.6 % — ABNORMAL LOW (ref 43.0–77.0)
Platelets: 434 10*3/uL — ABNORMAL HIGH (ref 150.0–400.0)
RBC: 4.32 Mil/uL (ref 3.87–5.11)
RDW: 13.4 % (ref 11.5–15.5)
WBC: 7.2 10*3/uL (ref 4.0–10.5)

## 2022-07-05 LAB — MICROALBUMIN / CREATININE URINE RATIO
Creatinine,U: 59.8 mg/dL
Microalb Creat Ratio: 1.2 mg/g (ref 0.0–30.0)
Microalb, Ur: <0.7 mg/dL (ref 0.0–1.9)

## 2022-07-05 LAB — HEMOGLOBIN A1C: Hgb A1c MFr Bld: 7.5 % — ABNORMAL HIGH (ref 4.6–6.5)

## 2022-07-05 LAB — TSH: TSH: 2.26 u[IU]/mL (ref 0.35–5.50)

## 2022-07-05 LAB — VITAMIN D 25 HYDROXY (VIT D DEFICIENCY, FRACTURES): VITD: 43.51 ng/mL (ref 30.00–100.00)

## 2022-07-05 MED ORDER — ATENOLOL 100 MG PO TABS: 100.0000 mg | ORAL_TABLET | Freq: Every day | ORAL | 3 refills | Status: DC

## 2022-07-05 NOTE — Progress Notes (Signed)
Subjective:    Patient ID: Erin Hatfield, female    DOB: [birth year], 72 y.o.   MRN: [id number]  HPI Patient presents for yearly preventative medicine examination. She is a pleasant 72 year old female who  has a past medical history of Allergy, Bunion, left foot, Complication of anesthesia, Diabetes mellitus without complication ([place]), Family history of breast cancer, Family history of ovarian cancer, Hot flashes, Hypertension, and PONV (postoperative nausea and vomiting).  Diabetes mellitus type 2-currently managed with metformin 1000 mg twice daily.  She does monitor her blood sugars at home and reports readings in the 120s to 150s.  She denies episodes of hypoglycemia.  She does take amitriptyline 50 mg nightly for diabetic neuropathy this has controlled her symptoms well over the years but recently started to have worsening neuropathic symptoms intermittently.  Lab Results  Component Value Date   HGBA1C 7.1 (A) 02/14/2022   Hypertension-managed with atenolol 75 mg daily and lisinopril 40 mg daily.  She denies dizziness, lightheadedness, chest pain, shortness of breath.  She does check her blood pressure at home with readings in the Q000111Q to Q000111Q systolic. BP Readings from Last 3 Encounters:  07/05/22 (!) 160/90  02/14/22 132/86  10/27/21 (!) 156/94   Hyperlipidemia -prescribed Tricor 145 mg daily and simvastatin 5 mg daily.  She denies myalgia or fatigue Lab Results  Component Value Date   CHOL 152 07/03/2021   HDL 37.50 (L) 07/03/2021   LDLCALC 83 07/03/2021   LDLDIRECT 81.0 06/26/2019   TRIG 158.0 (H) 07/03/2021   CHOLHDL 4 07/03/2021    Vitamin D Deficiency  - takes Vitamin D 1000 units daily  All immunizations and health maintenance protocols were reviewed with the patient and needed orders were placed.  Appropriate screening laboratory values were ordered for the patient including screening of hyperlipidemia, renal function and hepatic function. If indicated by BPH, a  PSA was ordered.  Medication reconciliation,  past medical history, social history, problem list and allergies were reviewed in detail with the patient  Goals were established with regard to weight loss, exercise, and  diet in compliance with medications Wt Readings from Last 3 Encounters:  07/05/22 165 lb (74.8 kg)  02/14/22 164 lb (74.4 kg)  02/05/22 165 lb (74.8 kg)   She is up to date on routine colon cancer screening and mammograms    Review of Systems  Constitutional: Negative.   HENT: Negative.    Eyes: Negative.   Respiratory: Negative.    Cardiovascular: Negative.   Gastrointestinal: Negative.   Endocrine: Negative.   Genitourinary: Negative.   Musculoskeletal: Negative.   Skin: Negative.   Allergic/Immunologic: Negative.   Neurological: Negative.   Hematological: Negative.   Psychiatric/Behavioral: Negative.     Past Medical History:  Diagnosis Date   Allergy    Bunion, left foot    Complication of anesthesia    Diabetes mellitus without complication (HCC)    type 2   Family history of breast cancer    Family history of ovarian cancer    Hot flashes    Hypertension    PONV (postoperative nausea and vomiting)     Social History   Socioeconomic History   Marital status: Married    Spouse name: Not on file   Number of children: Not on file   Years of education: Not on file   Highest education level: Not on file  Occupational History   Not on file  Tobacco Use   Smoking status: Never  Smokeless tobacco: Never  Vaping Use   Vaping Use: Never used  Substance and Sexual Activity   Alcohol use: No   Drug use: No   Sexual activity: Not Currently  Other Topics Concern   Not on file  Social History Narrative   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, environmental   Married for 23 years   One daughter ( Erin Hatfield)    One grandson   Social Determinants of Health   Financial Resource Strain: Low Risk   (02/05/2022)   Overall Financial Resource Strain (CARDIA)    Difficulty of Paying Living Expenses: Not hard at all  Food Insecurity: No Food Insecurity (02/05/2022)   Hunger Vital Sign    Worried About Running Out of Food in the Last Year: Never true    Ran Out of Food in the Last Year: Never true  Transportation Needs: No Transportation Needs (02/05/2022)   PRAPARE - Hydrologist (Medical): No    Lack of Transportation (Non-Medical): No  Physical Activity: Inactive (02/05/2022)   Exercise Vital Sign    Days of Exercise per Week: 0 days    Minutes of Exercise per Session: 0 min  Stress: No Stress Concern Present (02/05/2022)   [place]    Feeling of Stress : Not at all  Social Connections: Moderately Integrated (02/02/2021)   Social Connection and Isolation Panel [NHANES]    Frequency of Communication with Friends and Family: Three times a week    Frequency of Social Gatherings with Friends and Family: Three times a week    Attends Religious Services: More than 4 times per year    Active Member of Clubs or Organizations: No    Attends Archivist Meetings: Never    Marital Status: Married  Human resources officer Violence: Not At Risk (02/02/2021)   Humiliation, Afraid, Rape, and Kick questionnaire    Fear of Current or Ex-Partner: No    Emotionally Abused: No    Physically Abused: No    Sexually Abused: No    Past Surgical History:  Procedure Laterality Date   LAPAROSCOPIC BILATERAL SALPINGO OOPHERECTOMY Bilateral 10/13/2018   Procedure: LAPAROSCOPIC BILATERAL SALPINGO OOPHORECTOMY;  Surgeon: Erin Salon, MD;  Location: [facility];  Service: Gynecology;  Laterality: Bilateral;   VAGINAL HYSTERECTOMY  2002   fibroids partial    Family History  Problem Relation Age of Onset   Diabetes Sister    Ovarian cancer Sister 39   Breast cancer Mother 27   Lung cancer Father         Smoker   Diabetes Other    Hypertension Other    Diabetes Sister    Breast cancer Niece 37   Colon cancer Neg Hx     No Known Allergies  Current Outpatient Medications on File Prior to Visit  Medication Sig Dispense Refill   Accu-Chek Softclix Lancets lancets USE 1  TO CHECK GLUCOSE ONCE DAILY AT 2 PM 100 each 0   Alcohol Swabs (B-D SINGLE USE SWABS REGULAR) PADS Use to clean finger before checking blood sugar 2 times daily 100 each 1   amitriptyline (ELAVIL) 50 MG tablet TAKE 1 TABLET BY MOUTH AT BEDTIME 90 tablet 3   aspirin 81 MG tablet Take 81 mg by mouth daily.     atenolol (TENORMIN) 50 MG tablet TAKE 1 & 1/2 (ONE & ONE-HALF) TABLETS BY MOUTH ONCE DAILY  135 tablet 2   Blood Glucose Calibration (ACCU-CHEK GUIDE CONTROL) LIQD 1 Bottle by In Vitro route as needed. 1 each 1   Blood Glucose Monitoring Suppl (ACCU-CHEK GUIDE ME) w/Device KIT 2 Sticks by Does not apply route 3 times/day as needed-between meals & bedtime. Used to check blood sugar 2 times daily 1 kit 0   cetirizine (ZYRTEC) 10 MG tablet Take 10 mg by mouth as needed.     cholecalciferol (VITAMIN D) 1000 UNITS tablet Take 1,000 Units by mouth daily.     Chromium-Cinnamon 100-500 MCG-MG CAPS Take by mouth 2 (two) times daily.     fenofibrate (TRICOR) 145 MG tablet Take 1 tablet (145 mg total) by mouth daily. 90 tablet 3   fish oil-omega-3 fatty acids 1000 MG capsule Take 1 g by mouth daily.     glucose blood (ACCU-CHEK GUIDE) test strip USE 1 TEST STRIP TO CHECK BLOOD SUGAR TWICE DAILY 100 each 0   lisinopril (ZESTRIL) 40 MG tablet Take 1 tablet (40 mg total) by mouth daily. 90 tablet 3   metFORMIN (GLUCOPHAGE) 1000 MG tablet Take 1 tablet (1,000 mg total) by mouth 2 (two) times daily with a meal. 180 tablet 3   Multiple Vitamin (MULTIVITAMIN) tablet Take 1 tablet by mouth daily.     simvastatin (ZOCOR) 5 MG tablet Take 1 tablet (5 mg total) by mouth daily. 90 tablet 3   No current facility-administered medications  on file prior to visit.    BP (!) 160/90   Pulse 76   Temp 97.6 F (36.4 C) (Oral)   Ht 5' 2"$  (1.575 m)   Wt 165 lb (74.8 kg)   LMP 05/14/2000 (Approximate)   SpO2 93%   BMI 30.18 kg/m       Objective:   Physical Exam Vitals and nursing note reviewed.  Constitutional:      General: She is not in acute distress.    Appearance: Normal appearance. She is well-developed. She is not ill-appearing.  HENT:     Head: Normocephalic and atraumatic.     Right Ear: Tympanic membrane, ear canal and external ear normal. There is no impacted cerumen.     Left Ear: Tympanic membrane, ear canal and external ear normal. There is no impacted cerumen.     Nose: Nose normal. No congestion or rhinorrhea.     Mouth/Throat:     Mouth: Mucous membranes are moist.     Pharynx: Oropharynx is clear. No oropharyngeal exudate or posterior oropharyngeal erythema.  Eyes:     General:        Right eye: No discharge.        Left eye: No discharge.     Extraocular Movements: Extraocular movements intact.     Conjunctiva/sclera: Conjunctivae normal.     Pupils: Pupils are equal, round, and reactive to light.  Neck:     Thyroid: No thyromegaly.     Vascular: No carotid bruit.     Trachea: No tracheal deviation.  Cardiovascular:     Rate and Rhythm: Normal rate and regular rhythm.     Pulses: Normal pulses.     Heart sounds: Normal heart sounds. No murmur heard.    No friction rub. No gallop.  Pulmonary:     Effort: Pulmonary effort is normal. No respiratory distress.     Breath sounds: Normal breath sounds. No stridor. No wheezing, rhonchi or rales.  Chest:     Chest wall: No tenderness.  Abdominal:     General:  Abdomen is flat. Bowel sounds are normal. There is no distension.     Palpations: Abdomen is soft. There is no mass.     Tenderness: There is no abdominal tenderness. There is no right CVA tenderness, left CVA tenderness, guarding or rebound.     Hernia: No hernia is present.   Musculoskeletal:        General: No swelling, tenderness, deformity or signs of injury. Normal range of motion.     Cervical back: Normal range of motion and neck supple.     Right lower leg: No edema.     Left lower leg: No edema.  Lymphadenopathy:     Cervical: No cervical adenopathy.  Skin:    General: Skin is warm and dry.     Coloration: Skin is not jaundiced or pale.     Findings: No bruising, erythema, lesion or rash.  Neurological:     General: No focal deficit present.     Mental Status: She is alert and oriented to person, place, and time.     Cranial Nerves: No cranial nerve deficit.     Sensory: No sensory deficit.     Motor: No weakness.     Coordination: Coordination normal.     Gait: Gait normal.     Deep Tendon Reflexes: Reflexes normal.  Psychiatric:        Mood and Affect: Mood normal.        Behavior: Behavior normal.        Thought Content: Thought content normal.        Judgment: Judgment normal.       Assessment & Plan:  1. Routine general medical examination at a health care facility Today patient counseled on age appropriate routine health concerns for screening and prevention, each reviewed and up to date or declined. Immunizations reviewed and up to date or declined. Labs ordered and reviewed. Risk factors for depression reviewed and negative. Hearing function and visual acuity are intact. ADLs screened and addressed as needed. Functional ability and level of safety reviewed and appropriate. Education, counseling and referrals performed based on assessed risks today. Patient provided with a copy of personalized plan for preventive services. - Advised to get shingles and Tdap at pharmacy    2. Type 2 diabetes mellitus with diabetic polyneuropathy, without long-term current use of insulin ([place]) - Consider adding agent  - Likely follow up in 3 months  - CBC with Differential/Platelet; Future - Comprehensive metabolic panel; Future - Lipid panel;  Future - TSH; Future - Hemoglobin A1c; Future - Microalbumin/Creatinine Ratio, Urine; Future  3. Essential hypertension - Will increase Atenolol to 100 mg daily  - CBC with Differential/Platelet; Future - Comprehensive metabolic panel; Future - Lipid panel; Future - TSH; Future - Hemoglobin A1c; Future - Microalbumin/Creatinine Ratio, Urine; Future - atenolol (TENORMIN) 100 MG tablet; Take 1 tablet (100 mg total) by mouth daily.  Dispense: 90 tablet; Refill: 3  4. Mixed hyperlipidemia - Consider increase in statin  - CBC with Differential/Platelet; Future - Comprehensive metabolic panel; Future - Lipid panel; Future - TSH; Future - Hemoglobin A1c; Future  5. Diabetic neuropathy, painful ([place]) - Will have her increase her Elavil dose to 75 mg or 100 mg at home. She will send me a mychart message and let me know how the dose works for her.  - CBC with Differential/Platelet; Future - Comprehensive metabolic panel; Future - Lipid panel; Future - TSH; Future - Hemoglobin A1c; Future  6. Vitamin D deficiency  -  VITAMIN D 25 Hydroxy (Vit-D Deficiency, Fractures); Future  [name], NP

## 2022-07-05 NOTE — Patient Instructions (Signed)
It was great seeing you today   We will follow up with you regarding your lab work   Please let me know if you need anything   

## 2022-07-06 ENCOUNTER — Telehealth: Payer: Self-pay | Admitting: Adult Health

## 2022-07-06 NOTE — Telephone Encounter (Signed)
Updated patient on her labs. A1c up to 7.5. She would like three months of lifestyle changes before adding medication

## 2022-07-16 ENCOUNTER — Other Ambulatory Visit: Payer: Self-pay | Admitting: Adult Health

## 2022-07-16 DIAGNOSIS — E139 Other specified diabetes mellitus without complications: Secondary | ICD-10-CM

## 2022-07-16 DIAGNOSIS — E782 Mixed hyperlipidemia: Secondary | ICD-10-CM

## 2022-08-22 ENCOUNTER — Other Ambulatory Visit: Payer: Self-pay | Admitting: Adult Health

## 2022-10-10 ENCOUNTER — Other Ambulatory Visit: Payer: Self-pay | Admitting: Adult Health

## 2022-10-10 ENCOUNTER — Encounter: Payer: Self-pay | Admitting: Adult Health

## 2022-10-10 ENCOUNTER — Ambulatory Visit: Payer: Medicare PPO | Admitting: Adult Health

## 2022-10-10 VITALS — BP 138/88 | HR 75 | Temp 98.2°F | Ht 62.0 in | Wt 162.4 lb

## 2022-10-10 DIAGNOSIS — Z7984 Long term (current) use of oral hypoglycemic drugs: Secondary | ICD-10-CM | POA: Diagnosis not present

## 2022-10-10 DIAGNOSIS — E782 Mixed hyperlipidemia: Secondary | ICD-10-CM

## 2022-10-10 DIAGNOSIS — E1142 Type 2 diabetes mellitus with diabetic polyneuropathy: Secondary | ICD-10-CM

## 2022-10-10 DIAGNOSIS — I10 Essential (primary) hypertension: Secondary | ICD-10-CM | POA: Diagnosis not present

## 2022-10-10 DIAGNOSIS — E1140 Type 2 diabetes mellitus with diabetic neuropathy, unspecified: Secondary | ICD-10-CM | POA: Diagnosis not present

## 2022-10-10 DIAGNOSIS — Z0000 Encounter for general adult medical examination without abnormal findings: Secondary | ICD-10-CM

## 2022-10-10 LAB — POCT GLYCOSYLATED HEMOGLOBIN (HGB A1C): Hemoglobin A1C: 7.3 % — AB (ref 4.0–5.6)

## 2022-10-10 MED ORDER — AMITRIPTYLINE HCL 75 MG PO TABS: 75.0000 mg | ORAL_TABLET | Freq: Every day | ORAL | 3 refills | Status: DC

## 2022-10-10 MED ORDER — EMPAGLIFLOZIN 10 MG PO TABS
10.0000 mg | ORAL_TABLET | Freq: Every day | ORAL | 0 refills | Status: DC
Start: 2022-10-10 — End: 2023-01-03

## 2022-10-10 MED ORDER — SIMVASTATIN 5 MG PO TABS: 5.0000 mg | ORAL_TABLET | Freq: Every day | ORAL | 2 refills | Status: DC

## 2022-10-10 MED ORDER — FENOFIBRATE 145 MG PO TABS: 145.0000 mg | ORAL_TABLET | Freq: Every day | ORAL | 2 refills | Status: DC

## 2022-10-10 MED ORDER — ACCU-CHEK GUIDE VI STRP: ORAL_STRIP | 0 refills | Status: DC

## 2022-10-10 NOTE — Progress Notes (Signed)
Subjective:    Patient ID: Erin Hatfield, female    DOB: [birth year], 72 y.o.   MRN: [id number]  HPI 72 year old female who  has a past medical history of Allergy, Bunion, left foot, Complication of anesthesia, Diabetes mellitus without complication (HCC), Family history of breast cancer, Family history of ovarian cancer, Hot flashes, Hypertension, and PONV (postoperative nausea and vomiting).  She presents to the office today for three month follow up regarding DM and HTN   Diabetes mellitus type 2-currently managed with metformin 1000 mg twice daily.  She does monitor her blood sugars at home and reports readings in the 120s to 150s.  She denies episodes of hypoglycemia.  She does take amitriptyline 75 mg nightly for diabetic neuropathy this has controlled her symptoms well.  Lab Results  Component Value Date   HGBA1C 7.5 (H) 07/05/2022   Hypertension-managed with atenolol 100mg  daily and lisinopril 40 mg daily.  She denies dizziness, lightheadedness, chest pain, shortness of breath.  She does check her blood pressure at home with readings in the 130s systolic. BP Readings from Last 3 Encounters:  10/10/22 (!) 164/82  07/05/22 138/80  02/14/22 132/86   She also needs a refill of Simvastatin and Tricor that she takes for hyperlipidemia.    Review of Systems See HPI   Past Medical History:  Diagnosis Date   Allergy    Bunion, left foot    Complication of anesthesia    Diabetes mellitus without complication (HCC)    type 2   Family history of breast cancer    Family history of ovarian cancer    Hot flashes    Hypertension    PONV (postoperative nausea and vomiting)     Social History   Socioeconomic History   Marital status: Married    Spouse name: Not on file   Number of children: Not on file   Years of education: Not on file   Highest education level: Not on file  Occupational History   Not on file  Tobacco Use   Smoking status: Never   Smokeless tobacco:  Never  Vaping Use   Vaping Use: Never used  Substance and Sexual Activity   Alcohol use: No   Drug use: No   Sexual activity: Not Currently  Other Topics Concern   Not on file  Social History Narrative   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, occupational   Married for 23 years   One daughter Shelva [place])    One grandson   Social Determinants of Health   Financial Resource Strain: Low Risk  (02/05/2022)   Overall Financial Resource Strain (CARDIA)    Difficulty of Paying Living Expenses: Not hard at all  Food Insecurity: No Food Insecurity (02/05/2022)   Hunger Vital Sign    Worried About Running Out of Food in the Last Year: Never true    Ran Out of Food in the Last Year: Never true  Transportation Needs: No Transportation Needs (02/05/2022)   PRAPARE - Administrator, Civil Service (Medical): No    Lack of Transportation (Non-Medical): No  Physical Activity: Inactive (02/05/2022)   Exercise Vital Sign    Days of Exercise per Week: 0 days    Minutes of Exercise per Session: 0 min  Stress: No Stress Concern Present (02/05/2022)   [employer] of Occupational Health - Occupational Stress Questionnaire    Feeling of Stress : Not at all  Social Connections: Moderately Integrated (02/02/2021)   Social Connection and Isolation Panel [NHANES]    Frequency of Communication with Friends and Family: Three times a week    Frequency of Social Gatherings with Friends and Family: Three times a week    Attends Religious Services: More than 4 times per year    Active Member of Clubs or Organizations: No    Attends Banker Meetings: Never    Marital Status: Married  Catering manager Violence: Not At Risk (02/02/2021)   Humiliation, Afraid, Rape, and Kick questionnaire    Fear of Current or Ex-Partner: No    Emotionally Abused: No    Physically Abused: No    Sexually Abused: No    Past Surgical History:  Procedure  Laterality Date   LAPAROSCOPIC BILATERAL SALPINGO OOPHERECTOMY Bilateral 10/13/2018   Procedure: LAPAROSCOPIC BILATERAL SALPINGO OOPHORECTOMY;  Surgeon: Jerene Bears, MD;  Location: [facility];  Service: Gynecology;  Laterality: Bilateral;   VAGINAL HYSTERECTOMY  2002   fibroids partial    Family History  Problem Relation Age of Onset   Diabetes Sister    Ovarian cancer Sister 40   Breast cancer Mother 44   Lung cancer Father        Smoker   Diabetes Other    Hypertension Other    Diabetes Sister    Breast cancer Niece 39   Colon cancer Neg Hx     No Known Allergies  Current Outpatient Medications on File Prior to Visit  Medication Sig Dispense Refill   ACCU-CHEK GUIDE test strip USE 1  TEST STRIP TO CHECK BLOOD SUGAR TWICE DAILY 100 each 0   Accu-Chek Softclix Lancets lancets USE 1  TO CHECK GLUCOSE ONCE DAILY AT 2 PM 100 each 0   Alcohol Swabs (B-D SINGLE USE SWABS REGULAR) PADS Use to clean finger before checking blood sugar 2 times daily 100 each 1   amitriptyline (ELAVIL) 50 MG tablet TAKE 1 TABLET BY MOUTH AT BEDTIME 90 tablet 3   aspirin 81 MG tablet Take 81 mg by mouth daily.     atenolol (TENORMIN) 100 MG tablet Take 1 tablet (100 mg total) by mouth daily. 90 tablet 3   Blood Glucose Calibration (ACCU-CHEK GUIDE CONTROL) LIQD 1 Bottle by In Vitro route as needed. 1 each 1   Blood Glucose Monitoring Suppl (ACCU-CHEK GUIDE ME) w/Device KIT 2 Sticks by Does not apply route 3 times/day as needed-between meals & bedtime. Used to check blood sugar 2 times daily 1 kit 0   cetirizine (ZYRTEC) 10 MG tablet Take 10 mg by mouth as needed.     cholecalciferol (VITAMIN D) 1000 UNITS tablet Take 1,000 Units by mouth daily.     Chromium-Cinnamon 100-500 MCG-MG CAPS Take by mouth 2 (two) times daily.     fenofibrate (TRICOR) 145 MG tablet Take 1 tablet by mouth once daily 90 tablet 0   fish oil-omega-3 fatty acids 1000 MG capsule Take 1 g by mouth daily.     lisinopril  (ZESTRIL) 40 MG tablet Take 1 tablet (40 mg total) by mouth daily. 90 tablet 3   metFORMIN (GLUCOPHAGE) 1000 MG tablet TAKE 1 TABLET BY MOUTH TWICE DAILY WITH  A  MEAL 180 tablet 0   Multiple Vitamin (MULTIVITAMIN) tablet Take 1 tablet by mouth daily.     simvastatin (ZOCOR) 5 MG tablet Take 1 tablet by mouth once daily 90 tablet 0   No current facility-administered medications on file prior to visit.  BP (!) 164/82 (BP Location: Left Arm, Patient Position: Sitting, Cuff Size: Normal)   Pulse 75   Temp 98.2 F (36.8 C) (Oral)   Ht 5\' 2"  (1.575 m)   Wt 162 lb 6.4 oz (73.7 kg)   LMP 05/14/2000 (Approximate)   SpO2 99%   BMI 29.70 kg/m       Objective:   Physical Exam Vitals and nursing note reviewed.  Constitutional:      Appearance: Normal appearance. She is obese.  Cardiovascular:     Rate and Rhythm: Normal rate and regular rhythm.     Pulses: Normal pulses.     Heart sounds: Normal heart sounds.  Pulmonary:     Effort: Pulmonary effort is normal.     Breath sounds: Normal breath sounds.  Skin:    General: Skin is warm and dry.  Neurological:     General: No focal deficit present.     Mental Status: She is alert and oriented to person, place, and time.  Psychiatric:        Mood and Affect: Mood normal.        Behavior: Behavior normal.        Thought Content: Thought content normal.        Judgment: Judgment normal.       Assessment & Plan:  1. Type 2 diabetes mellitus with diabetic polyneuropathy, without long-term current use of insulin (HCC)  - POC HgB A1c- 7.3 - has improved.  - Will start her on Jardiance 10 mg daily.  - Follow up in 3 months. We will recheck kidney function then  - glucose blood (ACCU-CHEK GUIDE) test strip; Use as instructed  Dispense: 100 each; Refill: 0 - amitriptyline (ELAVIL) 75 MG tablet; Take 1 tablet (75 mg total) by mouth at bedtime.  Dispense: 90 tablet; Refill: 3 - empagliflozin (JARDIANCE) 10 MG TABS tablet; Take 1 tablet (10  mg total) by mouth daily before breakfast.  Dispense: 90 tablet; Refill: 0  2. Essential hypertension - Controlled. No change   3. Mixed hyperlipidemia  - fenofibrate (TRICOR) 145 MG tablet; Take 1 tablet (145 mg total) by mouth daily.  Dispense: 90 tablet; Refill: 2 - simvastatin (ZOCOR) 5 MG tablet; Take 1 tablet (5 mg total) by mouth daily.  Dispense: 90 tablet; Refill: 2  4. Diabetic neuropathy, painful (HCC) - Controlled.  - amitriptyline (ELAVIL) 75 MG tablet; Take 1 tablet (75 mg total) by mouth at bedtime.  Dispense: 90 tablet; Refill: 3  Shirline Frees, NP

## 2022-11-06 ENCOUNTER — Encounter: Payer: Self-pay | Admitting: Adult Health

## 2022-11-06 NOTE — Telephone Encounter (Signed)
Please advise 

## 2022-11-19 ENCOUNTER — Ambulatory Visit: Admission: RE | Admit: 2022-11-19 | Payer: Medicare PPO | Source: Ambulatory Visit

## 2022-11-19 DIAGNOSIS — Z0000 Encounter for general adult medical examination without abnormal findings: Secondary | ICD-10-CM

## 2022-11-19 DIAGNOSIS — Z1231 Encounter for screening mammogram for malignant neoplasm of breast: Secondary | ICD-10-CM | POA: Diagnosis not present

## 2022-12-07 ENCOUNTER — Other Ambulatory Visit: Payer: Self-pay | Admitting: Adult Health

## 2022-12-12 ENCOUNTER — Encounter (INDEPENDENT_AMBULATORY_CARE_PROVIDER_SITE_OTHER): Payer: Self-pay

## 2022-12-13 ENCOUNTER — Ambulatory Visit: Payer: Medicare PPO | Admitting: Adult Health

## 2022-12-27 ENCOUNTER — Ambulatory Visit: Payer: Medicare PPO | Admitting: Adult Health

## 2023-01-02 ENCOUNTER — Ambulatory Visit (INDEPENDENT_AMBULATORY_CARE_PROVIDER_SITE_OTHER): Payer: Medicare PPO

## 2023-01-02 VITALS — Ht 62.0 in | Wt 162.0 lb

## 2023-01-02 DIAGNOSIS — Z0000 Encounter for general adult medical examination without abnormal findings: Secondary | ICD-10-CM | POA: Diagnosis not present

## 2023-01-02 NOTE — Patient Instructions (Addendum)
Erin Hatfield , Thank you for taking time to come for your Medicare Wellness Visit. I appreciate your ongoing commitment to your health goals. Please review the following plan we discussed and let me know if I can assist you in the future.   Referrals/Orders/Follow-Ups/Clinician Recommendations:   This is a list of the screening recommended for you and due dates:  Health Maintenance  Topic Date Due   Zoster (Shingles) Vaccine (1 of 2) 12/31/1998   DTaP/Tdap/Td vaccine (2 - Tdap) 12/12/2015   COVID-19 Vaccine (4 - 2023-24 season) 01/12/2022   Flu Shot  12/13/2022   Eye exam for diabetics  01/02/2023   Complete foot exam   02/15/2023   Hemoglobin A1C  04/12/2023   Yearly kidney function blood test for diabetes  07/06/2023   Yearly kidney health urinalysis for diabetes  07/06/2023   Mammogram  11/19/2023   Medicare Annual Wellness Visit  01/02/2024   Colon Cancer Screening  02/28/2025   Pneumonia Vaccine  Completed   DEXA scan (bone density measurement)  Completed   Hepatitis C Screening  Completed   HPV Vaccine  Aged Out    Advanced directives: (Declined) Advance directive discussed with you today. Even though you declined this today, please call our office should you change your mind, and we can give you the proper paperwork for you to fill out.  Next Medicare Annual Wellness Visit scheduled for next year: Yes

## 2023-01-02 NOTE — Progress Notes (Signed)
Subjective:   Erin Hatfield is a 72 y.o. female who presents for Medicare Annual (Subsequent) preventive examination.  Visit Complete: Virtual  I connected with  JENAVI SCHLOTFELDT on 01/02/23 by a audio enabled telemedicine application and verified that I am speaking with the correct person using two identifiers.  Patient Location: Home  Provider Location: Home Office  I discussed the limitations of evaluation and management by telemedicine. The patient expressed understanding and agreed to proceed.   Review of Systems    Vital Signs: Unable to obtain new vitals due to this being a telehealth visit.  Cardiac Risk Factors include: advanced age (>1men, >77 women);diabetes mellitus;hypertension     Objective:    Today's Vitals   01/02/23 1237  Weight: 162 lb (73.5 kg)  Height: 5\' 2"  (1.575 m)   Body mass index is 29.63 kg/m.     01/02/2023   12:44 PM 02/05/2022    8:15 AM 02/02/2021    8:28 AM 12/22/2019    2:53 PM 10/13/2018    8:24 AM  Advanced Directives  Does Patient Have a Medical Advance Directive? No No No No No  Would patient like information on creating a medical advance directive? No - Patient declined  No - Patient declined No - Patient declined No - Patient declined    Current Medications (verified) Outpatient Encounter Medications as of 01/02/2023  Medication Sig   Accu-Chek Softclix Lancets lancets USE 1  TO CHECK GLUCOSE ONCE DAILY AT 2 PM   Alcohol Swabs (B-D SINGLE USE SWABS REGULAR) PADS Use to clean finger before checking blood sugar 2 times daily   amitriptyline (ELAVIL) 75 MG tablet Take 1 tablet (75 mg total) by mouth at bedtime.   aspirin 81 MG tablet Take 81 mg by mouth daily.   atenolol (TENORMIN) 100 MG tablet Take 1 tablet (100 mg total) by mouth daily.   Blood Glucose Calibration (ACCU-CHEK GUIDE CONTROL) LIQD 1 Bottle by In Vitro route as needed.   Blood Glucose Monitoring Suppl (ACCU-CHEK GUIDE ME) w/Device KIT 2 Sticks by Does not apply  route 3 times/day as needed-between meals & bedtime. Used to check blood sugar 2 times daily   cetirizine (ZYRTEC) 10 MG tablet Take 10 mg by mouth as needed.   cholecalciferol (VITAMIN D) 1000 UNITS tablet Take 1,000 Units by mouth daily.   Chromium-Cinnamon 100-500 MCG-MG CAPS Take by mouth 2 (two) times daily.   empagliflozin (JARDIANCE) 10 MG TABS tablet Take 1 tablet (10 mg total) by mouth daily before breakfast.   fenofibrate (TRICOR) 145 MG tablet Take 1 tablet (145 mg total) by mouth daily.   fish oil-omega-3 fatty acids 1000 MG capsule Take 1 g by mouth daily.   glucose blood (ACCU-CHEK GUIDE) test strip Use as instructed   lisinopril (ZESTRIL) 40 MG tablet Take 1 tablet (40 mg total) by mouth daily.   metFORMIN (GLUCOPHAGE) 1000 MG tablet TAKE 1 TABLET BY MOUTH TWICE DAILY WITH A MEAL   Multiple Vitamin (MULTIVITAMIN) tablet Take 1 tablet by mouth daily.   simvastatin (ZOCOR) 5 MG tablet Take 1 tablet (5 mg total) by mouth daily.   No facility-administered encounter medications on file as of 01/02/2023.    Allergies (verified) Patient has no known allergies.   History: Past Medical History:  Diagnosis Date   Allergy    Bunion, left foot    Complication of anesthesia    Diabetes mellitus without complication (HCC)    type 2   Family history of  breast cancer    Family history of ovarian cancer    Hot flashes    Hypertension    PONV (postoperative nausea and vomiting)    Past Surgical History:  Procedure Laterality Date   LAPAROSCOPIC BILATERAL SALPINGO OOPHERECTOMY Bilateral 10/13/2018   Procedure: LAPAROSCOPIC BILATERAL SALPINGO OOPHORECTOMY;  Surgeon: Jerene Bears, MD;  Location: [facility] [place];  Service: Gynecology;  Laterality: Bilateral;   VAGINAL HYSTERECTOMY  2002   fibroids partial   Family History  Problem Relation Age of Onset   Diabetes Sister    Ovarian cancer Sister 92   Breast cancer Mother 67   Lung cancer Father        Smoker    Diabetes Other    Hypertension Other    Diabetes Sister    Breast cancer Niece 105   Colon cancer Neg Hx    Social History   Socioeconomic History   Marital status: Married    Spouse name: Not on file   Number of children: Not on file   Years of education: Not on file   Highest education level: Some college, no degree  Occupational History   Not on file  Tobacco Use   Smoking status: Never   Smokeless tobacco: Never  Vaping Use   Vaping status: Never Used  Substance and Sexual Activity   Alcohol use: No   Drug use: No   Sexual activity: Not Currently  Other Topics Concern   Not on file  Social History Narrative   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, occupational   Married for 23 years   One daughter Shelva Majestic [address] [place])    One grandson   Social Determinants of Health   Financial Resource Strain: Low Risk  (01/02/2023)   Overall Financial Resource Strain (CARDIA)    Difficulty of Paying Living Expenses: Not hard at all  Food Insecurity: No Food Insecurity (01/02/2023)   Hunger Vital Sign    Worried About Running Out of Food in the Last Year: Never true    Ran Out of Food in the Last Year: Never true  Transportation Needs: No Transportation Needs (01/02/2023)   PRAPARE - Administrator, Civil Service (Medical): No    Lack of Transportation (Non-Medical): No  Physical Activity: Insufficiently Active (01/02/2023)   Exercise Vital Sign    Days of Exercise per Week: 2 days    Minutes of Exercise per Session: 20 min  Stress: No Stress Concern Present (01/02/2023)   [employer] of Occupational Health - Occupational Stress Questionnaire    Feeling of Stress : Not at all  Social Connections: Socially Integrated (01/02/2023)   Social Connection and Isolation Panel [NHANES]    Frequency of Communication with Friends and Family: More than three times a week    Frequency of Social Gatherings with Friends and Family: More  than three times a week    Attends Religious Services: More than 4 times per year    Active Member of [employer] or Organizations: Yes    Attends Engineer, structural: More than 4 times per year    Marital Status: Married    Tobacco Counseling Counseling given: Not Answered   Clinical Intake:  Pre-visit preparation completed: Yes  Pain : No/denies pain     BMI - recorded: 29.63 Nutritional Status: BMI 25 -29 Overweight Nutritional Risks: None Diabetes: Yes CBG done?: Yes (CBG 141 Per patient) CBG resulted in Enter/ Edit results?: Yes Did  pt. bring in CBG monitor from home?: No  How often do you need to have someone help you when you read instructions, pamphlets, or other written materials from your doctor or pharmacy?: 1 - Never  Interpreter Needed?: No  Information entered by :: Theresa Mulligan LPN   Activities of Daily Living    01/02/2023   12:43 PM 02/05/2022    8:16 AM  In your present state of health, do you have any difficulty performing the following activities:  Hearing? 0 0  Vision? 0 0  Difficulty concentrating or making decisions? 0 0  Walking or climbing stairs? 0 0  Dressing or bathing? 0 0  Doing errands, shopping? 0 0  Preparing Food and eating ? N N  Using the Toilet? N N  In the past six months, have you accidently leaked urine? N N  Do you have problems with loss of bowel control? N N  Managing your Medications? N N  Managing your Finances? N N  Housekeeping or managing your Housekeeping? N N    Patient Care Team: Shirline Frees, NP as PCP - General (Family Medicine) Conley Rolls, [place], [place] as Referring Physician (Optometry)  Indicate any recent Medical Services you may have received from other than [facility] providers in the past year (date may be approximate).     Assessment:   This is a routine wellness examination for [place].  Hearing/Vision screen Hearing Screening - Comments:: Denies hearing difficulties   Vision Screening - Comments::  Wears rx glasses - up to date with routine eye exams with  Dr Nedra Hai  Dietary issues and exercise activities discussed:     Goals Addressed               This Visit's Progress     Increase physical activity (pt-stated)         Depression Screen    01/02/2023   12:42 PM 10/10/2022    8:14 AM 02/14/2022    7:40 AM 02/05/2022    8:16 AM 02/02/2021    8:30 AM 02/02/2021    8:27 AM 06/28/2020    1:08 PM  PHQ 2/9 Scores  PHQ - 2 Score 0 0 0 0 0 0 0  PHQ- 9 Score  0         Fall Risk    01/02/2023   12:43 PM 01/02/2023   10:14 AM 10/10/2022    8:09 AM 02/14/2022    7:40 AM 02/05/2022    8:16 AM  Fall Risk   Falls in the past year? 0 0 0 0 0  Number falls in past yr: 0  0 0 0  Injury with Fall? 0  0 0 0  Risk for fall due to : No Fall Risks  No Fall Risks No Fall Risks Medication side effect  Follow up Falls prevention discussed  Falls evaluation completed Falls evaluation completed Falls prevention discussed;Education provided;Falls evaluation completed    MEDICARE RISK AT HOME: Medicare Risk at Home Any stairs in or around the home?: Yes If so, are there any without handrails?: No Home free of loose throw rugs in walkways, pet beds, electrical cords, etc?: Yes Adequate lighting in your home to reduce risk of falls?: Yes Life alert?: No Use of a cane, walker or w/c?: No Grab bars in the bathroom?: No Shower chair or bench in shower?: No Elevated toilet seat or a handicapped toilet?: No  TIMED UP AND GO:  Was the test performed?  No    Cognitive  Function:        01/02/2023   12:44 PM 02/05/2022    8:17 AM 12/22/2019    2:58 PM  6CIT Screen  What Year? 0 points 0 points 0 points  What month? 0 points 0 points 0 points  What time? 0 points 0 points 0 points  Count back from 20 0 points 0 points 0 points  Months in reverse 0 points 0 points 0 points  Repeat phrase 0 points 0 points 0 points  Total Score 0 points 0 points 0 points    Immunizations Immunization  History  Administered Date(s) Administered   Fluad Quad(high Dose 65+) 02/07/2019, 05/11/2020, 02/28/2021, 02/06/2022   Influenza Split 02/13/2011, 03/24/2012   Influenza Whole 02/25/2008, 02/02/2009   Influenza, High Dose Seasonal PF 04/26/2015, 05/01/2016, 05/02/2017, 03/20/2018   Influenza,inj,Quad PF,6+ Mos 04/16/2013, 01/06/2014   Moderna SARS-COV2 Booster Vaccination 07/24/2019   Moderna Sars-Covid-2 Vaccination 06/19/2019, 07/17/2019   Pneumococcal Conjugate-13 04/20/2014   Pneumococcal Polysaccharide-23 04/26/2015   Td 12/11/2005   Zoster, Live 04/11/2011    TDAP status: Due, Education has been provided regarding the importance of this vaccine. Advised may receive this vaccine at local pharmacy or Health Dept. Aware to provide a copy of the vaccination record if obtained from local pharmacy or Health Dept. Verbalized acceptance and understanding.  Flu Vaccine status: Due, Education has been provided regarding the importance of this vaccine. Advised may receive this vaccine at local pharmacy or Health Dept. Aware to provide a copy of the vaccination record if obtained from local pharmacy or Health Dept. Verbalized acceptance and understanding.  Pneumococcal vaccine status: Up to date  Covid-19 vaccine status: Declined, Education has been provided regarding the importance of this vaccine but patient still declined. Advised may receive this vaccine at local pharmacy or Health Dept.or vaccine clinic. Aware to provide a copy of the vaccination record if obtained from local pharmacy or Health Dept. Verbalized acceptance and understanding.  Qualifies for Shingles Vaccine? Yes   Zostavax completed No   Shingrix Completed?: No.    Education has been provided regarding the importance of this vaccine. Patient has been advised to call insurance company to determine out of pocket expense if they have not yet received this vaccine. Advised may also receive vaccine at local pharmacy or Health Dept.  Verbalized acceptance and understanding.  Screening Tests Health Maintenance  Topic Date Due   Zoster Vaccines- Shingrix (1 of 2) 12/31/1998   DTaP/Tdap/Td (2 - Tdap) 12/12/2015   COVID-19 Vaccine (4 - 2023-24 season) 01/12/2022   INFLUENZA VACCINE  12/13/2022   OPHTHALMOLOGY EXAM  01/02/2023   FOOT EXAM  02/15/2023   HEMOGLOBIN A1C  04/12/2023   Diabetic kidney evaluation - eGFR measurement  07/06/2023   Diabetic kidney evaluation - Urine ACR  07/06/2023   MAMMOGRAM  11/19/2023   Medicare Annual Wellness (AWV)  01/02/2024   Colonoscopy  02/28/2025   Pneumonia Vaccine 70+ Years old  Completed   DEXA SCAN  Completed   Hepatitis C Screening  Completed   HPV VACCINES  Aged Out    Health Maintenance  Health Maintenance Due  Topic Date Due   Zoster Vaccines- Shingrix (1 of 2) 12/31/1998   DTaP/Tdap/Td (2 - Tdap) 12/12/2015   COVID-19 Vaccine (4 - 2023-24 season) 01/12/2022   INFLUENZA VACCINE  12/13/2022   OPHTHALMOLOGY EXAM  01/02/2023    Colorectal cancer screening: Type of screening: Colonoscopy. Completed 03/01/15. Repeat every 10 years  Mammogram status: Completed 7/68/24. Repeat every year  Bone Density status: Completed 04/11/20. Results reflect: Bone density results: NORMAL. Repeat every   years.  Lung Cancer Screening: (Low Dose CT Chest recommended if Age 52-80 years, 20 pack-year currently smoking OR have quit w/in 15years.) does not qualify.    Additional Screening:  Hepatitis C Screening: does qualify; Completed 06/05/18  Vision Screening: Recommended annual ophthalmology exams for early detection of glaucoma and other disorders of the eye. Is the patient up to date with their annual eye exam?  Yes  Who is the provider or what is the name of the office in which the patient attends annual eye exams? Dr Nedra Hai If pt is not established with a provider, would they like to be referred to a provider to establish care? No .   Dental Screening: Recommended annual  dental exams for proper oral hygiene  Diabetic Foot Exam: Diabetic Foot Exam: Completed 02/14/22  Community Resource Referral / Chronic Care Management:  CRR required this visit?  No   CCM required this visit?  No     Plan:     I have personally reviewed and noted the following in the patient's chart:   Medical and social history Use of alcohol, tobacco or illicit drugs  Current medications and supplements including opioid prescriptions. Patient is not currently taking opioid prescriptions. Functional ability and status Nutritional status Physical activity Advanced directives List of other physicians Hospitalizations, surgeries, and ER visits in previous 12 months Vitals Screenings to include cognitive, depression, and falls Referrals and appointments  In addition, I have reviewed and discussed with patient certain preventive protocols, quality metrics, and best practice recommendations. A written personalized care plan for preventive services as well as general preventive health recommendations were provided to patient.     Tillie Rung, LPN   1/61/0960   After Visit Summary: (MyChart) Due to this being a telephonic visit, the after visit summary with patients personalized plan was offered to patient via MyChart   Nurse Notes: None

## 2023-01-03 ENCOUNTER — Other Ambulatory Visit: Payer: Self-pay | Admitting: Adult Health

## 2023-01-03 ENCOUNTER — Ambulatory Visit: Payer: Medicare PPO | Admitting: Adult Health

## 2023-01-03 VITALS — BP 130/84 | HR 68 | Temp 98.2°F | Ht 62.4 in | Wt 158.1 lb

## 2023-01-03 DIAGNOSIS — Z800 Family history of malignant neoplasm of digestive organs: Secondary | ICD-10-CM | POA: Diagnosis not present

## 2023-01-03 DIAGNOSIS — I10 Essential (primary) hypertension: Secondary | ICD-10-CM | POA: Diagnosis not present

## 2023-01-03 DIAGNOSIS — E1142 Type 2 diabetes mellitus with diabetic polyneuropathy: Secondary | ICD-10-CM

## 2023-01-03 LAB — COMPREHENSIVE METABOLIC PANEL
ALT: 18 U/L (ref 0–35)
AST: 26 U/L (ref 0–37)
Albumin: 4.5 g/dL (ref 3.5–5.2)
Alkaline Phosphatase: 43 U/L (ref 39–117)
BUN: 12 mg/dL (ref 6–23)
CO2: 26 mEq/L (ref 19–32)
Calcium: 10.3 mg/dL (ref 8.4–10.5)
Chloride: 103 mEq/L (ref 96–112)
Creatinine, Ser: 0.96 mg/dL (ref 0.40–1.20)
GFR: 58.49 mL/min — ABNORMAL LOW (ref >60.00)
Glucose, Bld: 134 mg/dL — ABNORMAL HIGH (ref 70–99)
Potassium: 4.1 mEq/L (ref 3.5–5.1)
Sodium: 139 mEq/L (ref 135–145)
Total Bilirubin: 0.3 mg/dL (ref 0.2–1.2)
Total Protein: 7.7 g/dL (ref 6.0–8.3)

## 2023-01-03 LAB — HEMOGLOBIN A1C: Hgb A1c MFr Bld: 6.9 % — ABNORMAL HIGH (ref 4.6–6.5)

## 2023-01-03 MED ORDER — EMPAGLIFLOZIN 10 MG PO TABS
10.0000 mg | ORAL_TABLET | Freq: Every day | ORAL | 1 refills | Status: DC
Start: 2023-01-03 — End: 2023-07-04

## 2023-01-03 NOTE — Progress Notes (Signed)
Subjective:    Patient ID: Erin Hatfield, female    DOB: 1950/08/09, 72 y.o.   MRN: [id number]  HPI  72 year old female who  has a past medical history of Allergy, Bunion, left foot, Complication of anesthesia, Diabetes mellitus without complication (HCC), Family history of breast cancer, Family history of ovarian cancer, Hot flashes, Hypertension, and PONV (postoperative nausea and vomiting).  She presents to the office today for three month follow up regarding DM and HTN   Diabetes mellitus type 2-currently managed with metformin 1000 mg twice daily and Jardiacne 10 mg was added three months ago. .  She does monitor her blood sugars at home and reports readings in the 110's to 120's.   She denies episodes of hypoglycemia.  She does take amitriptyline 75 mg nightly for diabetic neuropathy this has controlled her symptoms well.  Lab Results  Component Value Date   HGBA1C 7.3 (A) 10/10/2022   Hypertension-managed with atenolol 100mg  daily and lisinopril 40 mg daily.  She denies dizziness, lightheadedness, chest pain, shortness of breath.  She does check her blood pressure at home with readings in the 130s systolic. BP Readings from Last 3 Encounters:  01/03/23 130/84  10/10/22 138/88  07/05/22 138/80    Family history of Pancreatic Cancer - she reports that her sister passed away recently from pancreatic cancer this is her second sibling to pass away from this type of cancer. 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 She denies symptoms such as jaundice, dark urine, itching, nausea/vomiting or abdominal pain.   Review of Systems See HPI   Past Medical History:  Diagnosis Date   Allergy    Bunion, left foot    Complication of anesthesia    Diabetes mellitus without complication (HCC)    type 2   Family history of breast cancer    Family history of ovarian cancer    Hot flashes    Hypertension    PONV  (postoperative nausea and vomiting)     Social History   Socioeconomic History   Marital status: Married    Spouse name: Not on file   Number of children: Not on file   Years of education: Not on file   Highest education level: Some college, no degree  Occupational History   Not on file  Tobacco Use   Smoking status: Never   Smokeless tobacco: Never  Vaping Use   Vaping status: Never Used  Substance and Sexual Activity   Alcohol use: No   Drug use: No   Sexual activity: Not Currently  Other Topics Concern   Not on file  Social History Narrative   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, occupational   Married for 23 years   One daughter Shelva Majestic [place])    One grandson   Social Determinants of Health   Financial Resource Strain: Low Risk  (01/02/2023)   Overall Financial Resource Strain (CARDIA)    Difficulty of Paying Living Expenses: Not hard at all  Food Insecurity: No Food Insecurity (01/02/2023)   Hunger Vital Sign    Worried About Running Out of Food in the Last Year: Never true    Ran Out of Food in the Last Year: Never true  Transportation Needs: No Transportation Needs (01/02/2023)   PRAPARE - Administrator, Civil Service (Medical): No    Lack of Transportation (Non-Medical):  No  Physical Activity: Insufficiently Active (01/02/2023)   Exercise Vital Sign    Days of Exercise per Week: 2 days    Minutes of Exercise per Session: 20 min  Stress: No Stress Concern Present (01/02/2023)   [employer] of Occupational Health - Occupational Stress Questionnaire    Feeling of Stress : Not at all  Social Connections: Socially Integrated (01/02/2023)   Social Connection and Isolation Panel [NHANES]    Frequency of Communication with Friends and Family: More than three times a week    Frequency of Social Gatherings with Friends and Family: More than three times a week    Attends Religious Services: More than 4 times  per year    Active Member of Clubs or Organizations: Yes    Attends Banker Meetings: More than 4 times per year    Marital Status: Married  Catering manager Violence: Not At Risk (01/02/2023)   Humiliation, Afraid, Rape, and Kick questionnaire    Fear of Current or Ex-Partner: No    Emotionally Abused: No    Physically Abused: No    Sexually Abused: No    Past Surgical History:  Procedure Laterality Date   LAPAROSCOPIC BILATERAL SALPINGO OOPHERECTOMY Bilateral 10/13/2018   Procedure: LAPAROSCOPIC BILATERAL SALPINGO OOPHORECTOMY;  Surgeon: Jerene Bears, MD;  Location: [facility] [place];  Service: Gynecology;  Laterality: Bilateral;   VAGINAL HYSTERECTOMY  2002   fibroids partial    Family History  Problem Relation Age of Onset   Diabetes Sister    Ovarian cancer Sister 76   Breast cancer Mother 71   Lung cancer Father        Smoker   Diabetes Other    Hypertension Other    Diabetes Sister    Breast cancer Niece 37   Colon cancer Neg Hx     No Known Allergies  Current Outpatient Medications on File Prior to Visit  Medication Sig Dispense Refill   Accu-Chek Softclix Lancets lancets USE 1  TO CHECK GLUCOSE ONCE DAILY AT 2 PM 100 each 0   Alcohol Swabs (B-D SINGLE USE SWABS REGULAR) PADS Use to clean finger before checking blood sugar 2 times daily 100 each 1   amitriptyline (ELAVIL) 75 MG tablet Take 1 tablet (75 mg total) by mouth at bedtime. 90 tablet 3   aspirin 81 MG tablet Take 81 mg by mouth daily.     atenolol (TENORMIN) 100 MG tablet Take 1 tablet (100 mg total) by mouth daily. 90 tablet 3   Blood Glucose Calibration (ACCU-CHEK GUIDE CONTROL) LIQD 1 Bottle by In Vitro route as needed. 1 each 1   Blood Glucose Monitoring Suppl (ACCU-CHEK GUIDE ME) w/Device KIT 2 Sticks by Does not apply route 3 times/day as needed-between meals & bedtime. Used to check blood sugar 2 times daily 1 kit 0   cetirizine (ZYRTEC) 10 MG tablet Take 10 mg by mouth as  needed.     cholecalciferol (VITAMIN D) 1000 UNITS tablet Take 1,000 Units by mouth daily.     Chromium-Cinnamon 100-500 MCG-MG CAPS Take by mouth 2 (two) times daily.     empagliflozin (JARDIANCE) 10 MG TABS tablet Take 1 tablet (10 mg total) by mouth daily before breakfast. 90 tablet 0   fenofibrate (TRICOR) 145 MG tablet Take 1 tablet (145 mg total) by mouth daily. 90 tablet 2   fish oil-omega-3 fatty acids 1000 MG capsule Take 1 g by mouth daily.     glucose blood (ACCU-CHEK GUIDE)  test strip Use as instructed 100 each 0   lisinopril (ZESTRIL) 40 MG tablet Take 1 tablet (40 mg total) by mouth daily. 90 tablet 3   metFORMIN (GLUCOPHAGE) 1000 MG tablet TAKE 1 TABLET BY MOUTH TWICE DAILY WITH A MEAL 180 tablet 0   Multiple Vitamin (MULTIVITAMIN) tablet Take 1 tablet by mouth daily.     simvastatin (ZOCOR) 5 MG tablet Take 1 tablet (5 mg total) by mouth daily. 90 tablet 2   No current facility-administered medications on file prior to visit.    BP 130/84   Pulse 68   Temp 98.2 F (36.8 C) (Oral)   Ht 5' 2.4" (1.585 m)   Wt 158 lb 2 oz (71.7 kg)   LMP 05/14/2000 (Approximate)   SpO2 99%   BMI 28.55 kg/m       Objective:   Physical Exam Vitals and nursing note reviewed.  Constitutional:      Appearance: Normal appearance.  Cardiovascular:     Rate and Rhythm: Normal rate and regular rhythm.     Pulses: Normal pulses.     Heart sounds: Normal heart sounds.  Pulmonary:     Effort: Pulmonary effort is normal.     Breath sounds: Normal breath sounds.  Musculoskeletal:        General: Normal range of motion.  Skin:    General: Skin is warm and dry.     Capillary Refill: Capillary refill takes less than 2 seconds.  Neurological:     General: No focal deficit present.     Mental Status: She is alert and oriented to person, place, and time.  Psychiatric:        Mood and Affect: Mood normal.        Behavior: Behavior normal.        Thought Content: Thought content normal.         Judgment: Judgment normal.       Assessment & Plan:  1. Type 2 diabetes mellitus with diabetic polyneuropathy, without long-term current use of insulin (HCC) - Consider increase in Jardiance  - Hemoglobin A1c; Future  2. Essential hypertension - Controlled. No change in medication  - Hemoglobin A1c; Future  3. Family history of pancreatic cancer - She would like to do a CT Scan due to family history which I think is reasonable.  - Comprehensive metabolic panel; Future - CT ABDOMEN PELVIS W WO CONTRAST; Future  [employer]

## 2023-01-11 ENCOUNTER — Ambulatory Visit: Payer: Medicare PPO | Admitting: Adult Health

## 2023-01-21 ENCOUNTER — Other Ambulatory Visit: Payer: Self-pay | Admitting: Adult Health

## 2023-01-21 DIAGNOSIS — E1142 Type 2 diabetes mellitus with diabetic polyneuropathy: Secondary | ICD-10-CM

## 2023-01-24 ENCOUNTER — Other Ambulatory Visit: Payer: Self-pay | Admitting: Adult Health

## 2023-01-24 DIAGNOSIS — Z800 Family history of malignant neoplasm of digestive organs: Secondary | ICD-10-CM

## 2023-01-25 ENCOUNTER — Ambulatory Visit (HOSPITAL_BASED_OUTPATIENT_CLINIC_OR_DEPARTMENT_OTHER)
Admission: RE | Admit: 2023-01-25 | Discharge: 2023-01-25 | Disposition: A | Discharge status: Home / Self Care | Payer: Medicare PPO | Source: Ambulatory Visit | Attending: Adult Health | Admitting: Adult Health

## 2023-01-25 DIAGNOSIS — K449 Diaphragmatic hernia without obstruction or gangrene: Secondary | ICD-10-CM | POA: Insufficient documentation

## 2023-01-25 DIAGNOSIS — Z800 Family history of malignant neoplasm of digestive organs: Secondary | ICD-10-CM | POA: Diagnosis not present

## 2023-01-25 DIAGNOSIS — K769 Liver disease, unspecified: Secondary | ICD-10-CM | POA: Diagnosis not present

## 2023-01-25 DIAGNOSIS — K8020 Calculus of gallbladder without cholecystitis without obstruction: Secondary | ICD-10-CM | POA: Insufficient documentation

## 2023-01-25 MED ORDER — IOHEXOL 300 MG/ML  SOLN
100.0000 mL | Freq: Once | INTRAMUSCULAR | Status: AC | PRN
  Administered 2023-01-25: 100 mL via INTRAVENOUS

## 2023-02-04 ENCOUNTER — Ambulatory Visit (INDEPENDENT_AMBULATORY_CARE_PROVIDER_SITE_OTHER): Payer: Medicare PPO

## 2023-02-04 DIAGNOSIS — Z23 Encounter for immunization: Secondary | ICD-10-CM

## 2023-02-06 ENCOUNTER — Telehealth: Payer: Self-pay | Admitting: Adult Health

## 2023-02-06 NOTE — Telephone Encounter (Signed)
Pt returning call

## 2023-02-06 NOTE — Telephone Encounter (Signed)
Patient notified of lab update  and verbalized understanding.

## 2023-02-25 NOTE — Telephone Encounter (Signed)
Note opened in error.

## 2023-03-07 ENCOUNTER — Other Ambulatory Visit: Payer: Self-pay | Admitting: Adult Health

## 2023-03-07 DIAGNOSIS — I10 Essential (primary) hypertension: Secondary | ICD-10-CM

## 2023-03-07 DIAGNOSIS — E1142 Type 2 diabetes mellitus with diabetic polyneuropathy: Secondary | ICD-10-CM

## 2023-03-21 ENCOUNTER — Ambulatory Visit: Payer: Medicare PPO | Admitting: Adult Health

## 2023-03-21 ENCOUNTER — Encounter: Payer: Self-pay | Admitting: Adult Health

## 2023-03-21 VITALS — BP 138/80 | HR 82 | Temp 98.1°F | Ht 62.5 in | Wt 154.0 lb

## 2023-03-21 DIAGNOSIS — I10 Essential (primary) hypertension: Secondary | ICD-10-CM | POA: Diagnosis not present

## 2023-03-21 DIAGNOSIS — Z7984 Long term (current) use of oral hypoglycemic drugs: Secondary | ICD-10-CM | POA: Diagnosis not present

## 2023-03-21 DIAGNOSIS — E119 Type 2 diabetes mellitus without complications: Secondary | ICD-10-CM | POA: Diagnosis not present

## 2023-03-21 LAB — POCT GLYCOSYLATED HEMOGLOBIN (HGB A1C): Hemoglobin A1C: 6.5 % — AB (ref 4.0–5.6)

## 2023-03-21 MED ORDER — METFORMIN HCL 1000 MG PO TABS
1000.0000 mg | ORAL_TABLET | Freq: Two times a day (BID) | ORAL | 0 refills | Status: DC
Start: 2023-03-21 — End: 2023-07-11

## 2023-03-21 NOTE — Progress Notes (Signed)
Subjective:    Patient ID: Erin Hatfield, female    DOB: Dec 25, 1950, 72 y.o.   MRN: [id number]  HPI She presents to the office today for three month follow up regarding DM and HTN   Diabetes mellitus type 2-currently managed with metformin 1000 mg twice daily and Jardiacne 10 mg was added three months ago. .  She does monitor her blood sugars at home and reports readings in the 110's to 120's.   She denies episodes of hypoglycemia.  She does take amitriptyline 75 mg nightly for diabetic neuropathy this has controlled her symptoms well.  Lab Results  Component Value Date   HGBA1C 6.9 (H) 01/03/2023   HGBA1C 7.3 (A) 10/10/2022   HGBA1C 7.5 (H) 07/05/2022   Wt Readings from Last 3 Encounters:  03/21/23 154 lb (69.9 kg)  01/03/23 158 lb 2 oz (71.7 kg)  01/02/23 162 lb (73.5 kg)   Hypertension-managed with atenolol 100mg  daily and lisinopril 40 mg daily.  She denies dizziness, lightheadedness, chest pain, shortness of breath.  She does check her blood pressure at home with readings in the 130s systolic. She has not taken her medication this morning.  BP Readings from Last 3 Encounters:  03/21/23 138/80  01/03/23 130/84  10/10/22 138/88   Review of Systems See HPI   Past Medical History:  Diagnosis Date   Allergy    Bunion, left foot    Complication of anesthesia    Diabetes mellitus without complication (HCC)    type 2   Family history of breast cancer    Family history of ovarian cancer    Hot flashes    Hypertension    PONV (postoperative nausea and vomiting)     Social History   Socioeconomic History   Marital status: Married    Spouse name: Not on file   Number of children: Not on file   Years of education: Not on file   Highest education level: Some college, no degree  Occupational History   Not on file  Tobacco Use   Smoking status: Never   Smokeless tobacco: Never  Vaping Use   Vaping status: Never Used  Substance and Sexual Activity   Alcohol use: No    Drug use: No   Sexual activity: Not Currently  Other Topics Concern   Not on file  Social History Narrative   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, occupational   Married for 23 years   One daughter Shelva Majestic [place])    One grandson   Social Determinants of Health   Financial Resource Strain: Low Risk  (03/20/2023)   Overall Financial Resource Strain (CARDIA)    Difficulty of Paying Living Expenses: Not hard at all  Food Insecurity: No Food Insecurity (03/20/2023)   Hunger Vital Sign    Worried About Running Out of Food in the Last Year: Never true    Ran Out of Food in the Last Year: Never true  Transportation Needs: No Transportation Needs (03/20/2023)   PRAPARE - Administrator, Civil Service (Medical): No    Lack of Transportation (Non-Medical): No  Physical Activity: Insufficiently Active (03/20/2023)   Exercise Vital Sign    Days of Exercise per Week: 2 days    Minutes of Exercise per Session: 30 min  Stress: No Stress Concern Present (03/20/2023)   [employer] of Occupational Health - Occupational Stress Questionnaire    Feeling of Stress : Not at all  Social Connections: Socially Integrated (03/20/2023)   Social Connection and Isolation Panel [NHANES]    Frequency of Communication with Friends and Family: Three times a week    Frequency of Social Gatherings with Friends and Family: Once a week    Attends Religious Services: More than 4 times per year    Active Member of Clubs or Organizations: No    Attends Engineer, structural: More than 4 times per year    Marital Status: Married  Catering manager Violence: Not At Risk (01/02/2023)   Humiliation, Afraid, Rape, and Kick questionnaire    Fear of Current or Ex-Partner: No    Emotionally Abused: No    Physically Abused: No    Sexually Abused: No    Past Surgical History:  Procedure Laterality Date   LAPAROSCOPIC BILATERAL SALPINGO OOPHERECTOMY  Bilateral 10/13/2018   Procedure: LAPAROSCOPIC BILATERAL SALPINGO OOPHORECTOMY;  Surgeon: Jerene Bears, MD;  Location: [facility] [place];  Service: Gynecology;  Laterality: Bilateral;   VAGINAL HYSTERECTOMY  2002   fibroids partial    Family History  Problem Relation Age of Onset   Diabetes Sister    Ovarian cancer Sister 54   Breast cancer Mother 79   Lung cancer Father        Smoker   Diabetes Other    Hypertension Other    Diabetes Sister    Breast cancer Niece 32   Colon cancer Neg Hx     No Known Allergies  Current Outpatient Medications on File Prior to Visit  Medication Sig Dispense Refill   Accu-Chek Softclix Lancets lancets USE 1  TO CHECK GLUCOSE ONCE DAILY AT 2 PM 100 each 0   Alcohol Swabs (B-D SINGLE USE SWABS REGULAR) PADS Use to clean finger before checking blood sugar 2 times daily 100 each 1   amitriptyline (ELAVIL) 75 MG tablet Take 1 tablet (75 mg total) by mouth at bedtime. 90 tablet 3   aspirin 81 MG tablet Take 81 mg by mouth daily.     atenolol (TENORMIN) 100 MG tablet Take 1 tablet (100 mg total) by mouth daily. 90 tablet 3   Blood Glucose Calibration (ACCU-CHEK GUIDE CONTROL) LIQD 1 Bottle by In Vitro route as needed. 1 each 1   Blood Glucose Monitoring Suppl (ACCU-CHEK GUIDE ME) w/Device KIT 2 Sticks by Does not apply route 3 times/day as needed-between meals & bedtime. Used to check blood sugar 2 times daily 1 kit 0   cetirizine (ZYRTEC) 10 MG tablet Take 10 mg by mouth as needed.     cholecalciferol (VITAMIN D) 1000 UNITS tablet Take 1,000 Units by mouth daily.     Chromium-Cinnamon 100-500 MCG-MG CAPS Take by mouth 2 (two) times daily.     empagliflozin (JARDIANCE) 10 MG TABS tablet Take 1 tablet (10 mg total) by mouth daily before breakfast. 90 tablet 1   fenofibrate (TRICOR) 145 MG tablet Take 1 tablet (145 mg total) by mouth daily. 90 tablet 2   fish oil-omega-3 fatty acids 1000 MG capsule Take 1 g by mouth daily.     glucose blood  (ACCU-CHEK GUIDE) test strip USE AS DIRECTED 100 each 0   lisinopril (ZESTRIL) 40 MG tablet Take 1 tablet by mouth once daily 90 tablet 0   metFORMIN (GLUCOPHAGE) 1000 MG tablet TAKE 1 TABLET BY MOUTH TWICE DAILY WITH A MEAL 180 tablet 0   Multiple Vitamin (MULTIVITAMIN) tablet Take 1 tablet by mouth daily.     simvastatin (ZOCOR) 5 MG tablet  Take 1 tablet (5 mg total) by mouth daily. 90 tablet 2   No current facility-administered medications on file prior to visit.    BP 138/80   Pulse 82   Temp 98.1 F (36.7 C) (Oral)   Ht 5' 2.5" (1.588 m)   Wt 154 lb (69.9 kg)   LMP 05/14/2000 (Approximate)   SpO2 97%   BMI 27.72 kg/m       Objective:   Physical Exam Vitals and nursing note reviewed.  Constitutional:      Appearance: Normal appearance.  Cardiovascular:     Rate and Rhythm: Normal rate and regular rhythm.     Pulses: Normal pulses.     Heart sounds: Normal heart sounds.  Pulmonary:     Effort: Pulmonary effort is normal.     Breath sounds: Normal breath sounds.  Skin:    General: Skin is warm and dry.  Neurological:     General: No focal deficit present.     Mental Status: She is alert and oriented to person, place, and time.  Psychiatric:        Mood and Affect: Mood normal.        Behavior: Behavior normal.        Thought Content: Thought content normal.        Judgment: Judgment normal.        Assessment & Plan:  1. Diabetes mellitus treated with oral medication (HCC)  - POC HgB A1c- 6.5 - at goal. Continue with Jardiance and Metformin  - metFORMIN (GLUCOPHAGE) 1000 MG tablet; Take 1 tablet (1,000 mg total) by mouth 2 (two) times daily with a meal.  Dispense: 180 tablet; Refill: 0  2. Essential hypertension - well controlled. No change in medication    Shirline Frees, NP

## 2023-03-21 NOTE — Patient Instructions (Signed)
Health Maintenance Due  Topic Date Due   DTaP/Tdap/Td (2 - Tdap) 12/12/2015   Zoster Vaccines- Shingrix (2 of 2) 12/10/2022   OPHTHALMOLOGY EXAM  01/02/2023   COVID-19 Vaccine (4 - 2023-24 season) 01/13/2023   FOOT EXAM  02/15/2023       01/03/2023   10:18 AM 01/02/2023   12:42 PM 10/10/2022    8:14 AM  Depression screen PHQ 2/9  Decreased Interest 0 0 0  Down, Depressed, Hopeless 0 0 0  PHQ - 2 Score 0 0 0  Altered sleeping 0  0  Tired, decreased energy 0  0  Change in appetite 0  0  Feeling bad or failure about yourself  0  0  Trouble concentrating 0  0  Moving slowly or fidgety/restless 0  0  Suicidal thoughts 0  0  PHQ-9 Score 0  0  Difficult doing work/chores Not difficult at all

## 2023-03-25 ENCOUNTER — Other Ambulatory Visit: Payer: Self-pay | Admitting: Adult Health

## 2023-03-25 DIAGNOSIS — I10 Essential (primary) hypertension: Secondary | ICD-10-CM

## 2023-03-26 NOTE — Telephone Encounter (Signed)
The original prescription was discontinued on 07/05/2022 by Shirline Frees, NP. Renewing this prescription may not be appropriate.

## 2023-03-28 NOTE — Telephone Encounter (Signed)
Patient calling to check on progress of this refill request. Advised it was d/c'd by pcp

## 2023-03-28 NOTE — Telephone Encounter (Signed)
Is pt suppose to be on this medication? ?

## 2023-04-04 NOTE — Telephone Encounter (Addendum)
Pt called back to say she never got confirmation on whether or not this Rx (atenolol (TENORMIN) 100 MG tablets) was truly discontinued.  If so, Pt would like to know if NP will be sending an alternative Rx?   If not, Pt states she will be coming back from [place] today, and would like to go to [place] and pick up her refills.  Also, Pt says [facility] says she has 3 prescriptions that need a refill.  Pt says she is aware of the Atenolol, but is not sure what the other 2 could be.  Pt is asking for a call back to discuss.

## 2023-04-09 NOTE — Telephone Encounter (Signed)
Pt requesting a call to discuss why it was discontinued

## 2023-04-19 ENCOUNTER — Other Ambulatory Visit: Payer: Self-pay | Admitting: Adult Health

## 2023-04-19 DIAGNOSIS — E1142 Type 2 diabetes mellitus with diabetic polyneuropathy: Secondary | ICD-10-CM

## 2023-04-29 ENCOUNTER — Encounter: Payer: Self-pay | Admitting: Adult Health

## 2023-04-29 NOTE — Telephone Encounter (Signed)
 Care team updated and letter sent for eye exam notes.

## 2023-06-06 ENCOUNTER — Other Ambulatory Visit: Payer: Self-pay | Admitting: Adult Health

## 2023-06-06 DIAGNOSIS — I10 Essential (primary) hypertension: Secondary | ICD-10-CM

## 2023-06-06 DIAGNOSIS — E1142 Type 2 diabetes mellitus with diabetic polyneuropathy: Secondary | ICD-10-CM

## 2023-07-04 ENCOUNTER — Other Ambulatory Visit: Payer: Self-pay | Admitting: Adult Health

## 2023-07-04 DIAGNOSIS — E1142 Type 2 diabetes mellitus with diabetic polyneuropathy: Secondary | ICD-10-CM

## 2023-07-11 ENCOUNTER — Encounter: Payer: Self-pay | Admitting: Adult Health

## 2023-07-11 ENCOUNTER — Ambulatory Visit (INDEPENDENT_AMBULATORY_CARE_PROVIDER_SITE_OTHER): Payer: Medicare PPO | Admitting: Adult Health

## 2023-07-11 VITALS — BP 150/80 | Temp 98.0°F | Ht 63.0 in | Wt 154.0 lb

## 2023-07-11 DIAGNOSIS — E2839 Other primary ovarian failure: Secondary | ICD-10-CM

## 2023-07-11 DIAGNOSIS — I10 Essential (primary) hypertension: Secondary | ICD-10-CM | POA: Diagnosis not present

## 2023-07-11 DIAGNOSIS — E1140 Type 2 diabetes mellitus with diabetic neuropathy, unspecified: Secondary | ICD-10-CM

## 2023-07-11 DIAGNOSIS — E559 Vitamin D deficiency, unspecified: Secondary | ICD-10-CM | POA: Diagnosis not present

## 2023-07-11 DIAGNOSIS — E119 Type 2 diabetes mellitus without complications: Secondary | ICD-10-CM | POA: Diagnosis not present

## 2023-07-11 DIAGNOSIS — Z0000 Encounter for general adult medical examination without abnormal findings: Secondary | ICD-10-CM

## 2023-07-11 DIAGNOSIS — Z7984 Long term (current) use of oral hypoglycemic drugs: Secondary | ICD-10-CM

## 2023-07-11 DIAGNOSIS — E782 Mixed hyperlipidemia: Secondary | ICD-10-CM

## 2023-07-11 LAB — TSH: TSH: 2.05 u[IU]/mL (ref 0.35–5.50)

## 2023-07-11 LAB — COMPREHENSIVE METABOLIC PANEL
ALT: 11 U/L (ref 0–35)
AST: 18 U/L (ref 0–37)
Albumin: 4.6 g/dL (ref 3.5–5.2)
Alkaline Phosphatase: 41 U/L (ref 39–117)
BUN: 15 mg/dL (ref 6–23)
CO2: 28 meq/L (ref 19–32)
Calcium: 10.3 mg/dL (ref 8.4–10.5)
Chloride: 104 meq/L (ref 96–112)
Creatinine, Ser: 1.05 mg/dL (ref 0.40–1.20)
GFR: 52.34 mL/min — ABNORMAL LOW (ref >60.00)
Glucose, Bld: 118 mg/dL — ABNORMAL HIGH (ref 70–99)
Potassium: 4.5 meq/L (ref 3.5–5.1)
Sodium: 141 meq/L (ref 135–145)
Total Bilirubin: 0.5 mg/dL (ref 0.2–1.2)
Total Protein: 7.8 g/dL (ref 6.0–8.3)

## 2023-07-11 LAB — CBC WITH DIFFERENTIAL/PLATELET
Basophils Absolute: 0.1 10*3/uL (ref 0.0–0.1)
Basophils Relative: 0.9 % (ref 0.0–3.0)
Eosinophils Absolute: 0.1 10*3/uL (ref 0.0–0.7)
Eosinophils Relative: 2.2 % (ref 0.0–5.0)
HCT: 40 % (ref 36.0–46.0)
Hemoglobin: 12.8 g/dL (ref 12.0–15.0)
Lymphocytes Relative: 50.4 % — ABNORMAL HIGH (ref 12.0–46.0)
Lymphs Abs: 2.9 10*3/uL (ref 0.7–4.0)
MCHC: 31.8 g/dL (ref 30.0–36.0)
MCV: 85.2 fL (ref 78.0–100.0)
Monocytes Absolute: 0.4 10*3/uL (ref 0.1–1.0)
Monocytes Relative: 7.9 % (ref 3.0–12.0)
Neutro Abs: 2.2 10*3/uL (ref 1.4–7.7)
Neutrophils Relative %: 38.6 % — ABNORMAL LOW (ref 43.0–77.0)
Platelets: 453 10*3/uL — ABNORMAL HIGH (ref 150.0–400.0)
RBC: 4.7 Mil/uL (ref 3.87–5.11)
RDW: 13.3 % (ref 11.5–15.5)
WBC: 5.7 10*3/uL (ref 4.0–10.5)

## 2023-07-11 LAB — MAGNESIUM: Magnesium: 2 mg/dL (ref 1.5–2.5)

## 2023-07-11 LAB — HEMOGLOBIN A1C: Hgb A1c MFr Bld: 6.7 % — ABNORMAL HIGH (ref 4.6–6.5)

## 2023-07-11 LAB — MICROALBUMIN / CREATININE URINE RATIO
Creatinine,U: 51.7 mg/dL
Microalb Creat Ratio: 13.5 mg/g (ref 0.0–30.0)
Microalb, Ur: <0.7 mg/dL (ref 0.0–1.9)

## 2023-07-11 LAB — LIPID PANEL
Cholesterol: 139 mg/dL (ref 0–200)
HDL: 44.2 mg/dL (ref >39.00)
LDL Cholesterol: 72 mg/dL (ref 0–99)
NonHDL: 94.68
Total CHOL/HDL Ratio: 3
Triglycerides: 115 mg/dL (ref 0.0–149.0)
VLDL: 23 mg/dL (ref 0.0–40.0)

## 2023-07-11 LAB — VITAMIN D 25 HYDROXY (VIT D DEFICIENCY, FRACTURES): VITD: 43.2 ng/mL (ref 30.00–100.00)

## 2023-07-11 MED ORDER — AMITRIPTYLINE HCL 100 MG PO TABS: 100.0000 mg | ORAL_TABLET | Freq: Every day | ORAL | 1 refills | Status: DC

## 2023-07-11 MED ORDER — ACCU-CHEK GUIDE TEST VI STRP: 1.0000 | ORAL_STRIP | 3 refills | Status: DC

## 2023-07-11 MED ORDER — ATENOLOL 100 MG PO TABS: 100.0000 mg | ORAL_TABLET | Freq: Every day | ORAL | 3 refills | Status: AC

## 2023-07-11 MED ORDER — ACCU-CHEK SOFTCLIX LANCETS MISC: 0 refills | Status: AC

## 2023-07-11 MED ORDER — METFORMIN HCL 1000 MG PO TABS: 1000.0000 mg | ORAL_TABLET | Freq: Two times a day (BID) | ORAL | 0 refills | Status: DC

## 2023-07-11 NOTE — Progress Notes (Signed)
 Subjective:    Patient ID: Erin Hatfield, female    DOB: 1951-01-01, 73 y.o.   MRN: [id number]  HPI Patient presents for yearly preventative medicine examination. She is a pleasant 73 year old female who  has a past medical history of Allergy, Bunion, left foot, Complication of anesthesia, Diabetes mellitus without complication (HCC), Family history of breast cancer, Family history of ovarian cancer, Hot flashes, Hypertension, and PONV (postoperative nausea and vomiting).  Diabetes mellitus type 2-currently managed with metformin 1000 mg twice daily and Jardiacne 10 mg was added three months ago. .  She does monitor her blood sugars at home and reports readings in the 110's to 120's.   She denies episodes of hypoglycemia.  She does take amitriptyline 75 mg nightly for diabetic neuropathy, her neuropathy symptoms are worse at night. She would like to try increasing her dose.  Lab Results  Component Value Date   HGBA1C 6.5 (A) 03/21/2023   HGBA1C 6.9 (H) 01/03/2023   HGBA1C 7.3 (A) 10/10/2022   Wt Readings from Last 3 Encounters:  07/11/23 154 lb (69.9 kg)  03/21/23 154 lb (69.9 kg)  01/03/23 158 lb 2 oz (71.7 kg)   Hypertension-managed with atenolol 100mg  daily ( she has not had this medication since November) and lisinopril 40 mg daily.  She denies dizziness, lightheadedness, chest pain, shortness of breath.  She does check her blood pressure at home with readings in the 140-170 systolic. BP Readings from Last 3 Encounters:  07/11/23 (!) 150/80  03/21/23 138/80  01/03/23 130/84   Hyperlipidemia -prescribed Tricor 145 mg daily and simvastatin 5 mg daily.  She denies myalgia or fatigue Lab Results  Component Value Date   CHOL 145 07/05/2022   HDL 39.10 07/05/2022   LDLCALC 73 07/05/2022   LDLDIRECT 81.0 06/26/2019   TRIG 165.0 (H) 07/05/2022   CHOLHDL 4 07/05/2022    Vitamin D Deficiency  - takes Vitamin D 1000 units daily Last vitamin D Lab Results  Component Value Date    VD25OH 43.51 07/05/2022    All immunizations and health maintenance protocols were reviewed with the patient and needed orders were placed.  Appropriate screening laboratory values were ordered for the patient including screening of hyperlipidemia, renal function and hepatic function.  Medication reconciliation,  past medical history, social history, problem list and allergies were reviewed in detail with the patient  Goals were established with regard to weight loss, exercise, and  diet in compliance with medications  She is up to date on routine colon cancer screening and screening mammograms. She is due for bone density screen    Review of Systems  Constitutional: Negative.   HENT: Negative.    Eyes: Negative.   Respiratory: Negative.    Cardiovascular: Negative.   Gastrointestinal: Negative.   Endocrine: Negative.   Genitourinary: Negative.   Musculoskeletal: Negative.   Skin: Negative.   Allergic/Immunologic: Negative.   Neurological: Negative.   Hematological: Negative.   Psychiatric/Behavioral: Negative.     Past Medical History:  Diagnosis Date   Allergy    Bunion, left foot    Complication of anesthesia    Diabetes mellitus without complication (HCC)    type 2   Family history of breast cancer    Family history of ovarian cancer    Hot flashes    Hypertension    PONV (postoperative nausea and vomiting)     Social History   Socioeconomic History   Marital status: Married    Spouse name: Not  on file   Number of children: Not on file   Years of education: Not on file   Highest education level: Some college, no degree  Occupational History   Not on file  Tobacco Use   Smoking status: Never   Smokeless tobacco: Never  Vaping Use   Vaping status: Never Used  Substance and Sexual Activity   Alcohol use: No   Drug use: No   Sexual activity: Not Currently  Other Topics Concern   Not on file  Social History Narrative   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, occupational   Married for 23 years   One daughter Shelva Majestic [place])    One grandson   Social Drivers of Health   Financial Resource Strain: Low Risk  (07/10/2023)   Overall Financial Resource Strain (CARDIA)    Difficulty of Paying Living Expenses: Not hard at all  Food Insecurity: No Food Insecurity (07/10/2023)   Hunger Vital Sign    Worried About Running Out of Food in the Last Year: Never true    Ran Out of Food in the Last Year: Never true  Transportation Needs: No Transportation Needs (07/10/2023)   PRAPARE - Administrator, Civil Service (Medical): No    Lack of Transportation (Non-Medical): No  Physical Activity: Insufficiently Active (07/10/2023)   Exercise Vital Sign    Days of Exercise per Week: 2 days    Minutes of Exercise per Session: 20 min  Stress: No Stress Concern Present (07/10/2023)   [employer] of Occupational Health - Occupational Stress Questionnaire    Feeling of Stress : Not at all  Social Connections: Socially Integrated (07/10/2023)   Social Connection and Isolation Panel [NHANES]    Frequency of Communication with Friends and Family: More than three times a week    Frequency of Social Gatherings with Friends and Family: Once a week    Attends Religious Services: More than 4 times per year    Active Member of [employer] or Organizations: Yes    Attends Banker Meetings: 1 to 4 times per year    Marital Status: Married  Catering manager Violence: Not At Risk (01/02/2023)   Humiliation, Afraid, Rape, and Kick questionnaire    Fear of Current or Ex-Partner: No    Emotionally Abused: No    Physically Abused: No    Sexually Abused: No    Past Surgical History:  Procedure Laterality Date   LAPAROSCOPIC BILATERAL SALPINGO OOPHERECTOMY Bilateral 10/13/2018   Procedure: LAPAROSCOPIC BILATERAL SALPINGO OOPHORECTOMY;  Surgeon: Jerene Bears, MD;  Location: [facility];   Service: Gynecology;  Laterality: Bilateral;   VAGINAL HYSTERECTOMY  2002   fibroids partial    Family History  Problem Relation Age of Onset   Diabetes Sister    Ovarian cancer Sister 67   Breast cancer Mother 69   Lung cancer Father        Smoker   Diabetes Other    Hypertension Other    Diabetes Sister    Breast cancer Niece 82   Colon cancer Neg Hx     No Known Allergies  Current Outpatient Medications on File Prior to Visit  Medication Sig Dispense Refill   Alcohol Swabs (B-D SINGLE USE SWABS REGULAR) PADS Use to clean finger before checking blood sugar 2 times daily 100 each 1   aspirin 81 MG tablet Take 81 mg by mouth daily.     Blood Glucose  Monitoring Suppl (ACCU-CHEK GUIDE ME) w/Device KIT 2 Sticks by Does not apply route 3 times/day as needed-between meals & bedtime. Used to check blood sugar 2 times daily 1 kit 0   cetirizine (ZYRTEC) 10 MG tablet Take 10 mg by mouth as needed.     cholecalciferol (VITAMIN D) 1000 UNITS tablet Take 1,000 Units by mouth daily.     Chromium-Cinnamon 100-500 MCG-MG CAPS Take by mouth 2 (two) times daily.     fenofibrate (TRICOR) 145 MG tablet Take 1 tablet (145 mg total) by mouth daily. 90 tablet 2   fish oil-omega-3 fatty acids 1000 MG capsule Take 1 g by mouth daily.     JARDIANCE 10 MG TABS tablet TAKE 1 TABLET BY MOUTH ONCE DAILY BEFORE BREAKFAST 90 tablet 0   lisinopril (ZESTRIL) 40 MG tablet Take 1 tablet by mouth once daily 90 tablet 0   Multiple Vitamin (MULTIVITAMIN) tablet Take 1 tablet by mouth daily.     simvastatin (ZOCOR) 5 MG tablet Take 1 tablet (5 mg total) by mouth daily. 90 tablet 2   No current facility-administered medications on file prior to visit.    BP (!) 150/80   Temp 98 F (36.7 C) (Oral)   Ht 5\' 3"  (1.6 m)   Wt 154 lb (69.9 kg)   LMP 05/14/2000 (Approximate)   SpO2 98%   BMI 27.28 kg/m       Objective:   Physical Exam Vitals and nursing note reviewed.  Constitutional:      General: She is  not in acute distress.    Appearance: Normal appearance. She is not ill-appearing.  HENT:     Head: Normocephalic and atraumatic.     Right Ear: Tympanic membrane, ear canal and external ear normal. There is no impacted cerumen.     Left Ear: Tympanic membrane, ear canal and external ear normal. There is no impacted cerumen.     Nose: Nose normal. No congestion or rhinorrhea.     Mouth/Throat:     Mouth: Mucous membranes are moist.     Pharynx: Oropharynx is clear.  Eyes:     Extraocular Movements: Extraocular movements intact.     Conjunctiva/sclera: Conjunctivae normal.     Pupils: Pupils are equal, round, and reactive to light.  Neck:     Vascular: No carotid bruit.  Cardiovascular:     Rate and Rhythm: Normal rate and regular rhythm.     Pulses: Normal pulses.     Heart sounds: No murmur heard.    No friction rub. No gallop.  Pulmonary:     Effort: Pulmonary effort is normal.     Breath sounds: Normal breath sounds.  Abdominal:     General: Abdomen is flat. Bowel sounds are normal. There is no distension.     Palpations: Abdomen is soft. There is no mass.     Tenderness: There is no abdominal tenderness. There is no guarding or rebound.     Hernia: No hernia is present.  Musculoskeletal:        General: Normal range of motion.     Cervical back: Normal range of motion and neck supple.  Lymphadenopathy:     Cervical: No cervical adenopathy.  Skin:    General: Skin is warm and dry.     Capillary Refill: Capillary refill takes less than 2 seconds.  Neurological:     General: No focal deficit present.     Mental Status: She is alert and oriented to person, place, and  time.  Psychiatric:        Mood and Affect: Mood normal.        Behavior: Behavior normal.        Thought Content: Thought content normal.        Judgment: Judgment normal.           Assessment & Plan:  1. Routine general medical examination at a health care facility (Primary) Today patient counseled  on age appropriate routine health concerns for screening and prevention, each reviewed and up to date or declined. Immunizations reviewed and up to date or declined. Labs ordered and reviewed. Risk factors for depression reviewed and negative. Hearing function and visual acuity are intact. ADLs screened and addressed as needed. Functional ability and level of safety reviewed and appropriate. Education, counseling and referrals performed based on assessed risks today. Patient provided with a copy of personalized plan for preventive services. - Follow up in one year or sooner if needed - eat healthy and exercise   2. Diabetes mellitus treated with oral medication (HCC) - Consider adding agent - Stay active and eat healthy  - Follow up in 3 months  - CBC with Differential/Platelet; Future - Comprehensive metabolic panel; Future - Lipid panel; Future - TSH; Future - Hemoglobin A1c; Future - Microalbumin/Creatinine Ratio, Urine; Future - Magnesium; Future - Accu-Chek Softclix Lancets lancets; Use as instructed  Dispense: 100 each; Refill: 0 - glucose blood (ACCU-CHEK GUIDE TEST) test strip; 1 each by Other route See admin instructions.  Dispense: 300 each; Refill: 3 - metFORMIN (GLUCOPHAGE) 1000 MG tablet; Take 1 tablet (1,000 mg total) by mouth 2 (two) times daily with a meal.  Dispense: 180 tablet; Refill: 0  3. Essential hypertension - Will send Atenolol back in  - CBC with Differential/Platelet; Future - Comprehensive metabolic panel; Future - Lipid panel; Future - TSH; Future - Magnesium; Future - atenolol (TENORMIN) 100 MG tablet; Take 1 tablet (100 mg total) by mouth daily.  Dispense: 90 tablet; Refill: 3  4. Mixed hyperlipidemia - Continue with simvastatin  - CBC with Differential/Platelet; Future - Comprehensive metabolic panel; Future - Lipid panel; Future - TSH; Future - Magnesium; Future  5. Vitamin D deficiency  - VITAMIN D 25 Hydroxy (Vit-D Deficiency, Fractures);  Future  6. Diabetic neuropathy, painful (HCC) - Will increase Elavil to 100 mg QHS - CBC with Differential/Platelet; Future - Comprehensive metabolic panel; Future - Lipid panel; Future - TSH; Future - Hemoglobin A1c; Future - Microalbumin/Creatinine Ratio, Urine; Future - amitriptyline (ELAVIL) 100 MG tablet; Take 1 tablet (100 mg total) by mouth at bedtime.  Dispense: 90 tablet; Refill: 1  7. Estrogen deficiency  - DG Bone Density; Future - VITAMIN D 25 Hydroxy (Vit-D Deficiency, Fractures); Future  Shirline Frees, NP

## 2023-07-12 ENCOUNTER — Telehealth: Payer: Self-pay

## 2023-07-12 NOTE — Telephone Encounter (Signed)
 Pt notified to come back in 3 or 6 months and verbalized understanding.

## 2023-07-12 NOTE — Telephone Encounter (Signed)
 Copied from CRM [id number]. Topic: General - Other >> Jul 12, 2023  1:22 PM Rodman Pickle T wrote: Reason for CRM: patient would like a call back to see if she needs to come back in a month to have her ac1 levels checked

## 2023-07-24 ENCOUNTER — Ambulatory Visit: Payer: Self-pay | Admitting: Adult Health

## 2023-07-24 NOTE — Telephone Encounter (Signed)
 Chief Complaint: Diarrhea Symptoms: Mild to moderate Diarrhea, loss of appetite, gas Frequency: Comes and goes  Pertinent Negatives: Patient denies fever, vomiting, nausea, dehydration Disposition: [] ED /[] Urgent Care (no appt availability in office) / [x] Appointment(In office/virtual)/ []  [facility]/ [] Home Care/ [] Refused Recommended Disposition /[] [facility]/ []  Follow-up with PCP Additional Notes: Patient states she woke up Sunday morning vomiting. Shortly after she had diarrhea and loss of appetite. Patient report the vomiting has stopped but the diarrhea returned today. Care advice was given and an appointment has been scheduled with PCP tomorrow afternoon.   Summary: Diarrhea/ No Appetite   Copied From CRM [id number]. Reason for Triage: Patient started experiencing diarrhea and vomiting Sunday morning, today she is no longer vomiting but she still has diarrhea and has no appetite      Reason for Disposition  [1] MODERATE diarrhea (e.g., 4-6 times / day more than normal) AND [2] age > 70 years  Answer Assessment - Initial Assessment Questions 1. DIARRHEA SEVERITY: "How bad is the diarrhea?" "How many more stools have you had in the past 24 hours than normal?"    - NO DIARRHEA (SCALE 0)   - MILD (SCALE 1-3): Few loose or mushy BMs; increase of 1-3 stools over normal daily number of stools; mild increase in ostomy output.   -  MODERATE (SCALE 4-7): Increase of 4-6 stools daily over normal; moderate increase in ostomy output.   -  SEVERE (SCALE 8-10; OR "WORST POSSIBLE"): Increase of 7 or more stools daily over normal; moderate increase in ostomy output; incontinence.     2 2. ONSET: "When did the diarrhea begin?"      Sunday morning  3. BM CONSISTENCY: "How loose or watery is the diarrhea?"      Watery  4. VOMITING: "Are you also vomiting?" If Yes, ask: "How many times in the past 24 hours?"      No, stopped Sunday  5. ABDOMEN PAIN: "Are you having any abdomen  pain?" If Yes, ask: "What does it feel like?" (e.g., crampy, dull, intermittent, constant)      Gas pain  6. ABDOMEN PAIN SEVERITY: If present, ask: "How bad is the pain?"  (e.g., Scale 1-10; mild, moderate, or severe)   - MILD (1-3): doesn't interfere with normal activities, abdomen soft and not tender to touch    - MODERATE (4-7): interferes with normal activities or awakens from sleep, abdomen tender to touch    - SEVERE (8-10): excruciating pain, doubled over, unable to do any normal activities       Mild  7. ORAL INTAKE: If vomiting, "Have you been able to drink liquids?" "How much liquids have you had in the past 24 hours?"     Yes  8. HYDRATION: "Any signs of dehydration?" (e.g., dry mouth [not just dry lips], too weak to stand, dizziness, new weight loss) "When did you last urinate?"     No  9. EXPOSURE: "Have you traveled to a foreign country recently?" "Have you been exposed to anyone with diarrhea?" "Could you have eaten any food that was spoiled?"     No  10. ANTIBIOTIC USE: "Are you taking antibiotics now or have you taken antibiotics in the past 2 months?"       No 11. OTHER SYMPTOMS: "Do you have any other symptoms?" (e.g., fever, blood in stool)       Gas, loss of appetite  Protocols used: Diarrhea-A-AH

## 2023-07-25 ENCOUNTER — Ambulatory Visit: Admitting: Adult Health

## 2023-07-25 ENCOUNTER — Encounter: Payer: Self-pay | Admitting: Adult Health

## 2023-07-25 VITALS — BP 138/80 | HR 81 | Temp 98.0°F | Ht 63.0 in | Wt 153.0 lb

## 2023-07-25 DIAGNOSIS — K529 Noninfective gastroenteritis and colitis, unspecified: Secondary | ICD-10-CM

## 2023-07-25 NOTE — Progress Notes (Signed)
 Subjective:    Patient ID: Erin Hatfield, female    DOB: Oct 06, 1950, 73 y.o.   MRN: [id number]  HPI 73 year old female who  has a past medical history of Allergy, Bunion, left foot, Complication of anesthesia, Diabetes mellitus without complication (HCC), Family history of breast cancer, Family history of ovarian cancer, Hot flashes, Hypertension, and PONV (postoperative nausea and vomiting).  She presents to the office today for an acute visit. She reports about 3 days ago she developed diarrhea and vomiting. She reports that she has not had any vomiting since the first day of her symptoms but the diarrhea continued. She has not had a bowel movement today. Associated symptoms include dry mouth and loss of appetite. She has not eaten any suspicious foods . Denies fevers or chills. Today she feels " much better".   She has used imodium a twice, with the last being yesterday.   She has been able to sip water and ginger ale without difficulty.     Review of Systems See HPI   Past Medical History:  Diagnosis Date   Allergy    Bunion, left foot    Complication of anesthesia    Diabetes mellitus without complication (HCC)    type 2   Family history of breast cancer    Family history of ovarian cancer    Hot flashes    Hypertension    PONV (postoperative nausea and vomiting)     Social History   Socioeconomic History   Marital status: Married    Spouse name: Not on file   Number of children: Not on file   Years of education: Not on file   Highest education level: Some college, no degree  Occupational History   Not on file  Tobacco Use   Smoking status: Never   Smokeless tobacco: Never  Vaping Use   Vaping status: Never Used  Substance and Sexual Activity   Alcohol use: No   Drug use: No   Sexual activity: Not Currently  Other Topics Concern   Not on file  Social History Narrative   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, occupational    Married for 23 years   One daughter Shelva Majestic [place])    One grandson   Social Drivers of Health   Financial Resource Strain: Low Risk  (07/10/2023)   Overall Financial Resource Strain (CARDIA)    Difficulty of Paying Living Expenses: Not hard at all  Food Insecurity: No Food Insecurity (07/10/2023)   Hunger Vital Sign    Worried About Running Out of Food in the Last Year: Never true    Ran Out of Food in the Last Year: Never true  Transportation Needs: No Transportation Needs (07/10/2023)   PRAPARE - Administrator, Civil Service (Medical): No    Lack of Transportation (Non-Medical): No  Physical Activity: Insufficiently Active (07/10/2023)   Exercise Vital Sign    Days of Exercise per Week: 2 days    Minutes of Exercise per Session: 20 min  Stress: No Stress Concern Present (07/10/2023)   [employer] of Occupational Health - Occupational Stress Questionnaire    Feeling of Stress : Not at all  Social Connections: Socially Integrated (07/10/2023)   Social Connection and Isolation Panel [NHANES]    Frequency of Communication with Friends and Family: More than three times a week    Frequency of Social Gatherings with Friends and Family: Once a week  Attends Religious Services: More than 4 times per year    Active Member of Clubs or Organizations: Yes    Attends Club or Organization Meetings: 1 to 4 times per year    Marital Status: Married  Catering manager Violence: Not At Risk (01/02/2023)   Humiliation, Afraid, Rape, and Kick questionnaire    Fear of Current or Ex-Partner: No    Emotionally Abused: No    Physically Abused: No    Sexually Abused: No    Past Surgical History:  Procedure Laterality Date   LAPAROSCOPIC BILATERAL SALPINGO OOPHERECTOMY Bilateral 10/13/2018   Procedure: LAPAROSCOPIC BILATERAL SALPINGO OOPHORECTOMY;  Surgeon: Jerene Bears, MD;  Location: [facility];  Service: Gynecology;  Laterality: Bilateral;   VAGINAL  HYSTERECTOMY  2002   fibroids partial    Family History  Problem Relation Age of Onset   Diabetes Sister    Ovarian cancer Sister 31   Breast cancer Mother 1   Lung cancer Father        Smoker   Diabetes Other    Hypertension Other    Diabetes Sister    Breast cancer Niece 18   Colon cancer Neg Hx     No Known Allergies  Current Outpatient Medications on File Prior to Visit  Medication Sig Dispense Refill   Accu-Chek Softclix Lancets lancets Use as instructed 100 each 0   Alcohol Swabs (B-D SINGLE USE SWABS REGULAR) PADS Use to clean finger before checking blood sugar 2 times daily 100 each 1   amitriptyline (ELAVIL) 100 MG tablet Take 1 tablet (100 mg total) by mouth at bedtime. 90 tablet 1   aspirin 81 MG tablet Take 81 mg by mouth daily.     atenolol (TENORMIN) 100 MG tablet Take 1 tablet (100 mg total) by mouth daily. 90 tablet 3   Blood Glucose Monitoring Suppl (ACCU-CHEK GUIDE ME) w/Device KIT 2 Sticks by Does not apply route 3 times/day as needed-between meals & bedtime. Used to check blood sugar 2 times daily 1 kit 0   cetirizine (ZYRTEC) 10 MG tablet Take 10 mg by mouth as needed.     cholecalciferol (VITAMIN D) 1000 UNITS tablet Take 1,000 Units by mouth daily.     Chromium-Cinnamon 100-500 MCG-MG CAPS Take by mouth 2 (two) times daily.     fenofibrate (TRICOR) 145 MG tablet Take 1 tablet (145 mg total) by mouth daily. 90 tablet 2   fish oil-omega-3 fatty acids 1000 MG capsule Take 1 g by mouth daily.     glucose blood (ACCU-CHEK GUIDE TEST) test strip 1 each by Other route See admin instructions. 300 each 3   JARDIANCE 10 MG TABS tablet TAKE 1 TABLET BY MOUTH ONCE DAILY BEFORE BREAKFAST 90 tablet 0   lisinopril (ZESTRIL) 40 MG tablet Take 1 tablet by mouth once daily 90 tablet 0   metFORMIN (GLUCOPHAGE) 1000 MG tablet Take 1 tablet (1,000 mg total) by mouth 2 (two) times daily with a meal. 180 tablet 0   Multiple Vitamin (MULTIVITAMIN) tablet Take 1 tablet by mouth  daily.     simvastatin (ZOCOR) 5 MG tablet Take 1 tablet (5 mg total) by mouth daily. 90 tablet 2   No current facility-administered medications on file prior to visit.    BP (!) 140/80   Pulse 81   Temp 98 F (36.7 C) (Oral)   Ht 5\' 3"  (1.6 m)   Wt 153 lb (69.4 kg)   LMP 05/14/2000 (Approximate)   SpO2 100%  BMI 27.10 kg/m       Objective:   Physical Exam Vitals and nursing note reviewed.  Constitutional:      Appearance: Normal appearance.  Cardiovascular:     Rate and Rhythm: Regular rhythm.     Pulses: Normal pulses.     Heart sounds: Normal heart sounds.  Pulmonary:     Effort: Pulmonary effort is normal.     Breath sounds: Normal breath sounds.  Abdominal:     General: Abdomen is flat.     Palpations: Abdomen is soft.  Musculoskeletal:        General: Normal range of motion.  Skin:    General: Skin is warm and dry.     Capillary Refill: Capillary refill takes less than 2 seconds.  Neurological:     General: No focal deficit present.     Mental Status: She is alert and oriented to person, place, and time.  Psychiatric:        Mood and Affect: Mood normal.        Behavior: Behavior normal.        Thought Content: Thought content normal.        Judgment: Judgment normal.        Assessment & Plan:  1. Gastroenteritis (Primary) - Seems to be improving.  - Stay hydrated and rest.  - Bland diet for the next 2 days  - Follow up if symptoms not resolved in the next 1-2 days   Shirline Frees, NP

## 2023-08-01 ENCOUNTER — Other Ambulatory Visit: Payer: Self-pay | Admitting: Adult Health

## 2023-08-01 DIAGNOSIS — E782 Mixed hyperlipidemia: Secondary | ICD-10-CM

## 2023-08-01 NOTE — Telephone Encounter (Unsigned)
 Copied from CRM [id number]. Topic: Clinical - Medication Refill >> Aug 01, 2023  2:01 PM [place] wrote: Most Recent Primary Care Visit:  Provider: Shirline Frees  Department: [facility]  Visit Type: ACUTE  Date: 07/25/2023  Medication: fenofibrate (TRICOR) 145 MG tablet ; simvastatin (ZOCOR) 5 MG tablet   Has the patient contacted their pharmacy? Yes (Agent: If no, request that the patient contact the pharmacy for the refill. If patient does not wish to contact the pharmacy document the reason why and proceed with request.) (Agent: If yes, when and what did the pharmacy advise?)  Is this the correct pharmacy for this prescription? Yes If no, delete pharmacy and type the correct one.  This is the patient's preferred pharmacy:  [facility] [address] ([address]), [place] - [address] [place] ([place]) [place] [address] Phone: [phone] Fax: [phone]   Has the prescription been filled recently? Yes  Is the patient out of the medication? Yes  Has the patient been seen for an appointment in the last year OR does the patient have an upcoming appointment? Yes  Can we respond through MyChart? Yes  Agent: Please be advised that Rx refills may take up to 3 business days. We ask that you follow-up with your pharmacy.

## 2023-08-02 MED ORDER — SIMVASTATIN 5 MG PO TABS: 5.0000 mg | ORAL_TABLET | Freq: Every day | ORAL | 2 refills | Status: DC

## 2023-08-02 MED ORDER — FENOFIBRATE 145 MG PO TABS: 145.0000 mg | ORAL_TABLET | Freq: Every day | ORAL | 2 refills | Status: DC

## 2023-09-02 ENCOUNTER — Other Ambulatory Visit: Payer: Self-pay | Admitting: Adult Health

## 2023-09-02 DIAGNOSIS — E1142 Type 2 diabetes mellitus with diabetic polyneuropathy: Secondary | ICD-10-CM

## 2023-09-02 DIAGNOSIS — I10 Essential (primary) hypertension: Secondary | ICD-10-CM

## 2023-10-03 ENCOUNTER — Other Ambulatory Visit: Payer: Self-pay | Admitting: Adult Health

## 2023-10-03 DIAGNOSIS — E1142 Type 2 diabetes mellitus with diabetic polyneuropathy: Secondary | ICD-10-CM

## 2023-10-10 ENCOUNTER — Other Ambulatory Visit: Payer: Self-pay | Admitting: Adult Health

## 2023-10-10 DIAGNOSIS — Z0000 Encounter for general adult medical examination without abnormal findings: Secondary | ICD-10-CM

## 2023-11-05 ENCOUNTER — Other Ambulatory Visit: Payer: Self-pay | Admitting: Adult Health

## 2023-11-05 DIAGNOSIS — E119 Type 2 diabetes mellitus without complications: Secondary | ICD-10-CM

## 2023-11-20 ENCOUNTER — Ambulatory Visit
Admission: RE | Admit: 2023-11-20 | Discharge: 2023-11-20 | Disposition: A | Discharge status: Home / Self Care | Source: Ambulatory Visit | Attending: Adult Health

## 2023-11-20 DIAGNOSIS — Z1231 Encounter for screening mammogram for malignant neoplasm of breast: Secondary | ICD-10-CM | POA: Diagnosis not present

## 2023-11-20 DIAGNOSIS — Z0000 Encounter for general adult medical examination without abnormal findings: Secondary | ICD-10-CM

## 2023-12-05 ENCOUNTER — Other Ambulatory Visit: Payer: Self-pay | Admitting: Adult Health

## 2023-12-05 DIAGNOSIS — I10 Essential (primary) hypertension: Secondary | ICD-10-CM

## 2023-12-05 DIAGNOSIS — E1142 Type 2 diabetes mellitus with diabetic polyneuropathy: Secondary | ICD-10-CM

## 2023-12-18 ENCOUNTER — Ambulatory Visit: Admitting: Adult Health

## 2023-12-18 VITALS — BP 130/80 | HR 74 | Temp 98.0°F | Wt 154.4 lb

## 2023-12-18 DIAGNOSIS — E1142 Type 2 diabetes mellitus with diabetic polyneuropathy: Secondary | ICD-10-CM | POA: Diagnosis not present

## 2023-12-18 DIAGNOSIS — Z7984 Long term (current) use of oral hypoglycemic drugs: Secondary | ICD-10-CM | POA: Diagnosis not present

## 2023-12-18 DIAGNOSIS — I10 Essential (primary) hypertension: Secondary | ICD-10-CM | POA: Diagnosis not present

## 2023-12-18 LAB — POCT GLYCOSYLATED HEMOGLOBIN (HGB A1C): Hemoglobin A1C: 6.4 % — AB (ref 4.0–5.6)

## 2023-12-18 MED ORDER — EMPAGLIFLOZIN 10 MG PO TABS: 10.0000 mg | ORAL_TABLET | Freq: Every day | ORAL | 1 refills | Status: DC

## 2023-12-18 NOTE — Progress Notes (Signed)
 Subjective:    Patient ID: Erin Hatfield, female    DOB: 05-Jan-1951, 73 y.o.   MRN: [id number]  HPI  73 year old female who  has a past medical history of Allergy, Bunion, left foot, Complication of anesthesia, Diabetes mellitus without complication (HCC), Family history of breast cancer, Family history of ovarian cancer, Hot flashes, Hypertension, and PONV (postoperative nausea and vomiting).  She presents to the office today for follow up regarding DM and HTN   Diabetes mellitus type 2-currently managed with metformin  1000 mg twice daily and Jardiance  10 mg.  She does monitor her blood sugars at home and reports readings in the 110's to 120's.   She denies episodes of hypoglycemia.  She does take amitriptyline  100 mg nightly for diabetic neuropathy, her neuropathy symptoms are worse at night. She would like to try increasing her dose.  Lab Results  Component Value Date   HGBA1C 6.4 (A) 12/18/2023   HGBA1C 6.7 (H) 07/11/2023   HGBA1C 6.5 (A) 03/21/2023   Hypertension-managed with atenolol  100mg  daily and lisinopril  40 mg daily.  She denies dizziness, lightheadedness, chest pain, shortness of breath.   BP Readings from Last 3 Encounters:  12/18/23 130/80  07/25/23 138/80  07/11/23 (!) 150/80     Review of Systems See HPI   Past Medical History:  Diagnosis Date   Allergy    Bunion, left foot    Complication of anesthesia    Diabetes mellitus without complication (HCC)    type 2   Family history of breast cancer    Family history of ovarian cancer    Hot flashes    Hypertension    PONV (postoperative nausea and vomiting)     Social History   Socioeconomic History   Marital status: Married    Spouse name: Not on file   Number of children: Not on file   Years of education: Not on file   Highest education level: Some college, no degree  Occupational History   Not on file  Tobacco Use   Smoking status: Never   Smokeless tobacco: Never  Vaping Use   Vaping  status: Never Used  Substance and Sexual Activity   Alcohol use: No   Drug use: No   Sexual activity: Not Currently  Other Topics Concern   Not on file  Social History Narrative   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, occupational   Married for 23 years   One daughter ETTER Devora Fret [place] )    One grandson   Social Drivers of Corporate investment banker Strain: Low Risk  (12/16/2023)   Overall Financial Resource Strain (CARDIA)    Difficulty of Paying Living Expenses: Not hard at all  Food Insecurity: No Food Insecurity (12/16/2023)   Hunger Vital Sign    Worried About Running Out of Food in the Last Year: Never true    Ran Out of Food in the Last Year: Never true  Transportation Needs: No Transportation Needs (12/16/2023)   PRAPARE - Administrator, Civil Service (Medical): No    Lack of Transportation (Non-Medical): No  Physical Activity: Insufficiently Active (12/16/2023)   Exercise Vital Sign    Days of Exercise per Week: 2 days    Minutes of Exercise per Session: 20 min  Stress: No Stress Concern Present (12/16/2023)   [employer] of Occupational Health - Occupational Stress Questionnaire    Feeling of Stress: Not at all  Social Connections:  Socially Integrated (12/16/2023)   Social Connection and Isolation Panel    Frequency of Communication with Friends and Family: Three times a week    Frequency of Social Gatherings with Friends and Family: Once a week    Attends Religious Services: More than 4 times per year    Active Member of Clubs or Organizations: Yes    Attends Banker Meetings: 1 to 4 times per year    Marital Status: Married  Catering manager Violence: Not At Risk (01/02/2023)   Humiliation, Afraid, Rape, and Kick questionnaire    Fear of Current or Ex-Partner: No    Emotionally Abused: No    Physically Abused: No    Sexually Abused: No    Past Surgical History:  Procedure Laterality Date    LAPAROSCOPIC BILATERAL SALPINGO OOPHERECTOMY Bilateral 10/13/2018   Procedure: LAPAROSCOPIC BILATERAL SALPINGO OOPHORECTOMY;  Surgeon: Cleotilde Ronal RAMAN, MD;  Location: [facility] [place];  Service: Gynecology;  Laterality: Bilateral;   VAGINAL HYSTERECTOMY  2002   fibroids partial    Family History  Problem Relation Age of Onset   Diabetes Sister    Ovarian cancer Sister 19   Breast cancer Mother 58   Lung cancer Father        Smoker   Diabetes Other    Hypertension Other    Diabetes Sister    Breast cancer Niece 84   Colon cancer Neg Hx     No Known Allergies  Current Outpatient Medications on File Prior to Visit  Medication Sig Dispense Refill   Accu-Chek Softclix Lancets lancets Use as instructed 100 each 0   Alcohol Swabs (B-D SINGLE USE SWABS REGULAR) PADS Use to clean finger before checking blood sugar 2 times daily 100 each 1   amitriptyline  (ELAVIL ) 100 MG tablet Take 1 tablet (100 mg total) by mouth at bedtime. 90 tablet 1   aspirin 81 MG tablet Take 81 mg by mouth daily.     atenolol  (TENORMIN ) 100 MG tablet Take 1 tablet (100 mg total) by mouth daily. 90 tablet 3   Blood Glucose Monitoring Suppl (ACCU-CHEK GUIDE ME) w/Device KIT 2 Sticks by Does not apply route 3 times/day as needed-between meals & bedtime. Used to check blood sugar 2 times daily 1 kit 0   cetirizine (ZYRTEC) 10 MG tablet Take 10 mg by mouth as needed.     cholecalciferol (VITAMIN D ) 1000 UNITS tablet Take 1,000 Units by mouth daily.     Chromium-Cinnamon 100-500 MCG-MG CAPS Take by mouth 2 (two) times daily.     fenofibrate  (TRICOR ) 145 MG tablet Take 1 tablet (145 mg total) by mouth daily. 90 tablet 2   fish oil-omega-3 fatty acids 1000 MG capsule Take 1 g by mouth daily.     glucose blood (ACCU-CHEK GUIDE TEST) test strip 1 each by Other route See admin instructions. 300 each 3   JARDIANCE  10 MG TABS tablet TAKE 1 TABLET BY MOUTH ONCE DAILY BEFORE BREAKFAST 90 tablet 0   lisinopril  (ZESTRIL ) 40  MG tablet Take 1 tablet by mouth once daily 90 tablet 0   metFORMIN  (GLUCOPHAGE ) 1000 MG tablet TAKE 1 TABLET BY MOUTH TWICE DAILY WITH MEALS 180 tablet 0   Multiple Vitamin (MULTIVITAMIN) tablet Take 1 tablet by mouth daily.     simvastatin  (ZOCOR ) 5 MG tablet Take 1 tablet (5 mg total) by mouth daily. 90 tablet 2   No current facility-administered medications on file prior to visit.    BP 130/80 (BP  Location: Left Arm, Patient Position: Sitting, Cuff Size: Normal)   Pulse 74   Temp 98 F (36.7 C) (Oral)   Wt 154 lb 6.4 oz (70 kg)   LMP 05/14/2000 (Approximate)   SpO2 98%   BMI 27.35 kg/m       Objective:   Physical Exam Vitals and nursing note reviewed.  Constitutional:      Appearance: Normal appearance. She is obese.  Cardiovascular:     Rate and Rhythm: Normal rate and regular rhythm.     Pulses: Normal pulses.     Heart sounds: Normal heart sounds.  Pulmonary:     Effort: Pulmonary effort is normal.     Breath sounds: Normal breath sounds.  Skin:    General: Skin is warm and dry.  Neurological:     General: No focal deficit present.     Mental Status: She is alert and oriented to person, place, and time.  Psychiatric:        Mood and Affect: Mood normal.        Behavior: Behavior normal.        Thought Content: Thought content normal.        Judgment: Judgment normal.       Assessment & Plan:  1. Type 2 diabetes mellitus with diabetic polyneuropathy, without long-term current use of insulin (HCC) (Primary)  - POC HgB A1c- 6.4 - At goal - Continue with current regimen  - Follow up in 6 months for CPE  - empagliflozin  (JARDIANCE ) 10 MG TABS tablet; Take 1 tablet (10 mg total) by mouth daily before breakfast.  Dispense: 90 tablet; Refill: 1  2. Essential hypertension - At goal. No change in medication    Darleene Shape, NP

## 2024-01-13 ENCOUNTER — Other Ambulatory Visit: Payer: Self-pay | Admitting: Adult Health

## 2024-01-13 DIAGNOSIS — E1140 Type 2 diabetes mellitus with diabetic neuropathy, unspecified: Secondary | ICD-10-CM

## 2024-01-16 LAB — HM DIABETES EYE EXAM

## 2024-01-31 ENCOUNTER — Other Ambulatory Visit: Payer: Self-pay | Admitting: Adult Health

## 2024-01-31 DIAGNOSIS — E119 Type 2 diabetes mellitus without complications: Secondary | ICD-10-CM

## 2024-03-06 ENCOUNTER — Other Ambulatory Visit: Payer: Medicare PPO

## 2024-03-11 ENCOUNTER — Other Ambulatory Visit: Payer: Self-pay | Admitting: Adult Health

## 2024-03-11 DIAGNOSIS — E1142 Type 2 diabetes mellitus with diabetic polyneuropathy: Secondary | ICD-10-CM

## 2024-03-11 DIAGNOSIS — I10 Essential (primary) hypertension: Secondary | ICD-10-CM

## 2024-03-17 ENCOUNTER — Encounter: Payer: Self-pay | Admitting: Adult Health

## 2024-03-17 ENCOUNTER — Ambulatory Visit: Admitting: Adult Health

## 2024-03-17 VITALS — BP 150/80 | HR 67 | Temp 98.1°F | Ht 63.0 in | Wt 155.0 lb

## 2024-03-17 DIAGNOSIS — E1142 Type 2 diabetes mellitus with diabetic polyneuropathy: Secondary | ICD-10-CM

## 2024-03-17 MED ORDER — AMITRIPTYLINE HCL 150 MG PO TABS: 150.0000 mg | ORAL_TABLET | Freq: Every day | ORAL | 0 refills | Status: AC

## 2024-03-17 NOTE — Progress Notes (Signed)
 Subjective:    Patient ID: Erin Hatfield, female    DOB: 06-03-1950, 73 y.o.   MRN: [id number]  Foot Injury    73 year old female who  has a past medical history of Allergy, Bunion, left foot, Complication of anesthesia, Diabetes mellitus without complication (HCC), Family history of breast cancer, Family history of ovarian cancer, Hot flashes, Hypertension, and PONV (postoperative nausea and vomiting).  She presents to the office today or discomfort in her right foot. She reports that for the last few months she has an intermittent sensation of the top of her right foot feeling swollen, numb and a pulling sensation from the top of her foot. She denies swelling, redness or warmth. These sensations seem to be more apparent when standing up, once she starts walking it gets better but stays numb. She does have numbness in the left foot but this is much less severe.   She is taking Elavil  100 mg at bedtime for diabetic neuropathy and this seemed to help for a period of time but then did not get as much improvement, she is unsure if this feels the same as her diabetic neuropathy   She denies low back pain, numbness radiating down her legs, pain, or redness/warmth    Review of Systems See HPI   Past Medical History:  Diagnosis Date   Allergy    Bunion, left foot    Complication of anesthesia    Diabetes mellitus without complication (HCC)    type 2   Family history of breast cancer    Family history of ovarian cancer    Hot flashes    Hypertension    PONV (postoperative nausea and vomiting)     Social History   Socioeconomic History   Marital status: Married    Spouse name: Not on file   Number of children: Not on file   Years of education: Not on file   Highest education level: Some college, no degree  Occupational History   Not on file  Tobacco Use   Smoking status: Never   Smokeless tobacco: Never  Vaping Use   Vaping status: Never Used  Substance and Sexual  Activity   Alcohol use: No   Drug use: No   Sexual activity: Not Currently  Other Topics Concern   Not on file  Social History Narrative   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, Occupational   Married for 23 years   One daughter ETTER Devora Fret [place] )    One grandson   Social Drivers of Corporate Investment Banker Strain: Low Risk  (12/16/2023)   Overall Financial Resource Strain (CARDIA)    Difficulty of Paying Living Expenses: Not hard at all  Food Insecurity: No Food Insecurity (12/16/2023)   Hunger Vital Sign    Worried About Running Out of Food in the Last Year: Never true    Ran Out of Food in the Last Year: Never true  Transportation Needs: No Transportation Needs (12/16/2023)   PRAPARE - Administrator, Civil Service (Medical): No    Lack of Transportation (Non-Medical): No  Physical Activity: Insufficiently Active (12/16/2023)   Exercise Vital Sign    Days of Exercise per Week: 2 days    Minutes of Exercise per Session: 20 min  Stress: No Stress Concern Present (12/16/2023)   [employer] of Occupational Health - Occupational Stress Questionnaire    Feeling of Stress: Not at all  Social Connections:  Socially Integrated (12/16/2023)   Social Connection and Isolation Panel    Frequency of Communication with Friends and Family: Three times a week    Frequency of Social Gatherings with Friends and Family: Once a week    Attends Religious Services: More than 4 times per year    Active Member of Clubs or Organizations: Yes    Attends Banker Meetings: 1 to 4 times per year    Marital Status: Married  Catering Manager Violence: Not At Risk (01/02/2023)   Humiliation, Afraid, Rape, and Kick questionnaire    Fear of Current or Ex-Partner: No    Emotionally Abused: No    Physically Abused: No    Sexually Abused: No    Past Surgical History:  Procedure Laterality Date   LAPAROSCOPIC BILATERAL SALPINGO OOPHERECTOMY  Bilateral 10/13/2018   Procedure: LAPAROSCOPIC BILATERAL SALPINGO OOPHORECTOMY;  Surgeon: Cleotilde Ronal RAMAN, MD;  Location: [facility] [place];  Service: Gynecology;  Laterality: Bilateral;   VAGINAL HYSTERECTOMY  2002   fibroids partial    Family History  Problem Relation Age of Onset   Breast cancer Mother 46   Lung cancer Father        Smoker   Diabetes Sister    Ovarian cancer Sister 13   Diabetes Sister    Pancreatic cancer Sister    Pancreatic cancer Brother    Diabetes Brother    Prostate cancer Brother    Diabetes Maternal Uncle    Cancer Maternal Uncle    Prostate cancer Maternal Uncle    Diabetes Maternal Uncle    Breast cancer Niece 65   Diabetes Niece     No Known Allergies  Current Outpatient Medications on File Prior to Visit  Medication Sig Dispense Refill   Accu-Chek Softclix Lancets lancets Use as instructed 100 each 0   Alcohol Swabs (B-D SINGLE USE SWABS REGULAR) PADS Use to clean finger before checking blood sugar 2 times daily 100 each 1   aspirin 81 MG tablet Take 81 mg by mouth daily.     atenolol  (TENORMIN ) 100 MG tablet Take 1 tablet (100 mg total) by mouth daily. 90 tablet 3   Blood Glucose Monitoring Suppl (ACCU-CHEK GUIDE ME) w/Device KIT 2 Sticks by Does not apply route 3 times/day as needed-between meals & bedtime. Used to check blood sugar 2 times daily 1 kit 0   cetirizine (ZYRTEC) 10 MG tablet Take 10 mg by mouth as needed.     cholecalciferol (VITAMIN D ) 1000 UNITS tablet Take 1,000 Units by mouth daily.     Chromium-Cinnamon 100-500 MCG-MG CAPS Take by mouth 2 (two) times daily.     empagliflozin  (JARDIANCE ) 10 MG TABS tablet Take 1 tablet (10 mg total) by mouth daily before breakfast. 90 tablet 1   fenofibrate  (TRICOR ) 145 MG tablet Take 1 tablet (145 mg total) by mouth daily. 90 tablet 2   fish oil-omega-3 fatty acids 1000 MG capsule Take 1 g by mouth daily.     glucose blood (ACCU-CHEK GUIDE TEST) test strip 1 each by Other route See  admin instructions. 300 each 3   lisinopril  (ZESTRIL ) 40 MG tablet Take 1 tablet by mouth once daily 90 tablet 0   metFORMIN  (GLUCOPHAGE ) 1000 MG tablet TAKE 1 TABLET BY MOUTH TWICE DAILY WITH MEALS 180 tablet 0   Multiple Vitamin (MULTIVITAMIN) tablet Take 1 tablet by mouth daily.     simvastatin  (ZOCOR ) 5 MG tablet Take 1 tablet (5 mg total) by mouth daily. 90  tablet 2   No current facility-administered medications on file prior to visit.    BP (!) 150/80   Pulse 67   Temp 98.1 F (36.7 C) (Oral)   Ht 5' 3 (1.6 m)   Wt 155 lb (70.3 kg)   LMP 05/14/2000 (Approximate)   SpO2 97%   BMI 27.46 kg/m       Objective:   Physical Exam Vitals and nursing note reviewed.  Constitutional:      Appearance: Normal appearance.  Cardiovascular:     Rate and Rhythm: Normal rate and regular rhythm.     Pulses: Normal pulses.          Dorsalis pedis pulses are 2+ on the right side.       Posterior tibial pulses are 2+ on the right side.     Heart sounds: Normal heart sounds.  Pulmonary:     Effort: Pulmonary effort is normal.     Breath sounds: Normal breath sounds.  Musculoskeletal:        General: No tenderness.     Right lower leg: No edema.     Left lower leg: No edema.     Right foot: Normal range of motion. Bunion present. No deformity.       Feet:  Feet:     Right foot:     Skin integrity: Skin integrity normal.  Skin:    General: Skin is warm and dry.     Findings: No erythema.  Neurological:     General: No focal deficit present.     Mental Status: She is alert and oriented to person, place, and time.  Psychiatric:        Mood and Affect: Mood normal.        Behavior: Behavior normal.        Thought Content: Thought content normal.        Judgment: Judgment normal.       Assessment & Plan:  1. Diabetic polyneuropathy associated with type 2 diabetes mellitus (HCC) (Primary) - ? Diabetic neuropathy vs pinched nerve in lower back vs b12 deficiency. Will get xray of  low back and right foot, check lab work for b12. Will increase Elavil  to 150 mg at bedtime.   - amitriptyline  (ELAVIL ) 150 MG tablet; Take 1 tablet (150 mg total) by mouth at bedtime.  Dispense: 90 tablet; Refill: 0 - DG Lumbar Spine Complete; Future - DG Foot Complete Right; Future - Vitamin B12; Future - Consider MRI in the future   Darleene Shape, NP  I personally spent a total of 31 minutes in the care of the patient today including preparing to see the patient, getting/reviewing separately obtained history, performing a medically appropriate exam/evaluation, counseling and educating, placing orders, and documenting clinical information in the EHR.   Keziyah Kneale, NP

## 2024-03-20 ENCOUNTER — Other Ambulatory Visit

## 2024-03-20 ENCOUNTER — Ambulatory Visit

## 2024-03-20 ENCOUNTER — Ambulatory Visit: Payer: Self-pay | Admitting: Adult Health

## 2024-03-20 DIAGNOSIS — E1142 Type 2 diabetes mellitus with diabetic polyneuropathy: Secondary | ICD-10-CM

## 2024-03-20 DIAGNOSIS — R200 Anesthesia of skin: Secondary | ICD-10-CM

## 2024-03-20 DIAGNOSIS — M4316 Spondylolisthesis, lumbar region: Secondary | ICD-10-CM | POA: Diagnosis not present

## 2024-03-20 DIAGNOSIS — M47816 Spondylosis without myelopathy or radiculopathy, lumbar region: Secondary | ICD-10-CM | POA: Diagnosis not present

## 2024-03-20 DIAGNOSIS — M48061 Spinal stenosis, lumbar region without neurogenic claudication: Secondary | ICD-10-CM | POA: Diagnosis not present

## 2024-03-20 DIAGNOSIS — M51369 Other intervertebral disc degeneration, lumbar region without mention of lumbar back pain or lower extremity pain: Secondary | ICD-10-CM | POA: Diagnosis not present

## 2024-03-20 LAB — VITAMIN B12: Vitamin B-12: 112 pg/mL — ABNORMAL LOW (ref 211–911)

## 2024-03-31 ENCOUNTER — Ambulatory Visit (INDEPENDENT_AMBULATORY_CARE_PROVIDER_SITE_OTHER)

## 2024-03-31 VITALS — Ht 63.0 in | Wt 155.0 lb

## 2024-03-31 DIAGNOSIS — Z0000 Encounter for general adult medical examination without abnormal findings: Secondary | ICD-10-CM | POA: Diagnosis not present

## 2024-03-31 NOTE — Patient Instructions (Addendum)
 Erin Hatfield,  Thank you for taking the time for your Medicare Wellness Visit. I appreciate your continued commitment to your health goals. Please review the care plan we discussed, and feel free to reach out if I can assist you further.  Please note that Annual Wellness Visits do not include a physical exam. Some assessments may be limited, especially if the visit was conducted virtually. If needed, we may recommend an in-person follow-up with your provider.  Ongoing Care Seeing your primary care provider every 3 to 6 months helps us  monitor your health and provide consistent, personalized care. Next office visit on 07/14/2024.  You are due for a Flu vaccine and can get that done at your pharmacy.  Keep up the good work.  Referrals If a referral was made during today's visit and you haven't received any updates within two weeks, please contact the referred provider directly to check on the status.  Recommended Screenings:  Health Maintenance  Topic Date Due   DTaP/Tdap/Td vaccine (2 - Tdap) 12/12/2015   Zoster (Shingles) Vaccine (2 of 2) 12/10/2022   Flu Shot  12/13/2023   COVID-19 Vaccine (8 - 2025-26 season) 01/13/2024   Complete foot exam   03/20/2024   Hemoglobin A1C  06/19/2024   Yearly kidney function blood test for diabetes  07/10/2024   Yearly kidney health urinalysis for diabetes  07/10/2024   Breast Cancer Screening  11/19/2024   Eye exam for diabetics  01/15/2025   Colon Cancer Screening  02/28/2025   Medicare Annual Wellness Visit  03/31/2025   Pneumococcal Vaccine for age over 64  Completed   DEXA scan (bone density measurement)  Completed   Hepatitis C Screening  Completed   Meningitis B Vaccine  Aged Out       03/31/2024   12:25 PM  Advanced Directives  Does Patient Have a Medical Advance Directive? Yes  Type of Advance Directive Healthcare Power of Attorney    Vision: Annual vision screenings are recommended for early detection of glaucoma, cataracts, and  diabetic retinopathy. These exams can also reveal signs of chronic conditions such as diabetes and high blood pressure.  Dental: Annual dental screenings help detect early signs of oral cancer, gum disease, and other conditions linked to overall health, including heart disease and diabetes.  Please see the attached documents for additional preventive care recommendations.

## 2024-03-31 NOTE — Progress Notes (Signed)
 Chief Complaint  Patient presents with   Medicare Wellness     Subjective:   Erin Hatfield is a 73 y.o. female who presents for a Medicare Annual Wellness Visit.  Allergies (verified) Patient has no known allergies.   History: Past Medical History:  Diagnosis Date   Allergy    Bunion, left foot    Complication of anesthesia    Diabetes mellitus without complication (HCC)    type 2   Family history of breast cancer    Family history of ovarian cancer    Hot flashes    Hypertension    PONV (postoperative nausea and vomiting)    Past Surgical History:  Procedure Laterality Date   LAPAROSCOPIC BILATERAL SALPINGO OOPHERECTOMY Bilateral 10/13/2018   Procedure: LAPAROSCOPIC BILATERAL SALPINGO OOPHORECTOMY;  Surgeon: Cleotilde Ronal RAMAN, MD;  Location: [facility] [place];  Service: Gynecology;  Laterality: Bilateral;   VAGINAL HYSTERECTOMY  2002   fibroids partial   Family History  Problem Relation Age of Onset   Breast cancer Mother 37   Lung cancer Father        Smoker   Diabetes Sister    Ovarian cancer Sister 60   Diabetes Sister    Pancreatic cancer Sister    Pancreatic cancer Brother    Diabetes Brother    Prostate cancer Brother    Diabetes Maternal Uncle    Cancer Maternal Uncle    Prostate cancer Maternal Uncle    Diabetes Maternal Uncle    Breast cancer Niece 38   Diabetes Niece    Social History   Occupational History   Not on file  Tobacco Use   Smoking status: Never   Smokeless tobacco: Never  Vaping Use   Vaping status: Never Used  Substance and Sexual Activity   Alcohol use: No   Drug use: No   Sexual activity: Not Currently   Tobacco Counseling Counseling given: Not Answered  SDOH Screenings   Food Insecurity: No Food Insecurity (03/31/2024)  Housing: Unknown (03/31/2024)  Transportation Needs: No Transportation Needs (03/31/2024)  Utilities: Not At Risk (01/02/2023)  Alcohol Screen: Low Risk  (01/02/2023)  Depression (PHQ2-9):  Low Risk  (03/31/2024)  Financial Resource Strain: Low Risk  (03/31/2024)  Physical Activity: Insufficiently Active (03/31/2024)  Social Connections: Socially Integrated (03/31/2024)  Stress: No Stress Concern Present (03/31/2024)  Tobacco Use: Low Risk  (03/31/2024)  Health Literacy: Adequate Health Literacy (01/02/2023)   See flowsheets for full screening details  Depression Screen PHQ 2 & 9 Depression Scale- Over the past 2 weeks, how often have you been bothered by any of the following problems? Little interest or pleasure in doing things: 0 Feeling down, depressed, or hopeless (PHQ Adolescent also includes...irritable): 0 PHQ-2 Total Score: 0 Trouble falling or staying asleep, or sleeping too much: 0 Feeling tired or having little energy: 0 Poor appetite or overeating (PHQ Adolescent also includes...weight loss): 0 Feeling bad about yourself - or that you are a failure or have let yourself or your family down: 0 Trouble concentrating on things, such as reading the newspaper or watching television (PHQ Adolescent also includes...like school work): 0 Moving or speaking so slowly that other people could have noticed. Or the opposite - being so fidgety or restless that you have been moving around a lot more than usual: 0 Thoughts that you would be better off dead, or of hurting yourself in some way: 0 PHQ-9 Total Score: 0 If you checked off any problems, how difficult have these  problems made it for you to do your work, take care of things at home, or get along with other people?: Not difficult at all  Depression Treatment Depression Interventions/Treatment : EYV7-0 Score <4 Follow-up Not Indicated     Goals Addressed   None    Visit info / Clinical Intake: Medicare Wellness Visit Type:: Subsequent Annual Wellness Visit Persons participating in visit:: patient Medicare Wellness Visit Mode:: Telephone If telephone:: video declined Because this visit was a virtual/telehealth  visit:: vitals recorded from last visit If Telephone or Video please confirm:: I connected with the patient using audio enabled telemedicine application and verified that I am speaking with the correct person using two identifiers; I discussed the limitations of evaluation and management by telemedicine; The patient expressed understanding and agreed to proceed Patient Location:: Home Provider Location:: Home Information given by:: patient Interpreter Needed?: No Pre-visit prep was completed: yes AWV questionnaire completed by patient prior to visit?: yes Date:: 03/31/24 Living arrangements:: lives with spouse/significant other Patient's Overall Health Status Rating: very good Typical amount of pain: none Does pain affect daily life?: no Are you currently prescribed opioids?: no  Dietary Habits and Nutritional Risks How many meals a day?: 3 Eats fruit and vegetables daily?: yes Most meals are obtained by: preparing own meals In the last 2 weeks, have you had any of the following?: none Diabetic:: (!) yes Any non-healing wounds?: no How often do you check your BS?: 1 Would you like to be referred to a Nutritionist or for Diabetic Management? : no  Functional Status Activities of Daily Living (to include ambulation/medication): (Patient-Rptd) Independent Ambulation: Independent with device- listed below Home Assistive Devices/Equipment: Eyeglasses Medication Administration: Independent Home Management: (Patient-Rptd) Independent Manage your own finances?: yes Primary transportation is: driving Concerns about vision?: no *vision screening is required for WTM* Concerns about hearing?: no  Fall Screening Falls in the past year?: (Patient-Rptd) 0 Number of falls in past year: 0 Was there an injury with Fall?: 0 Fall Risk Category Calculator: 0 Patient Fall Risk Level: Low Fall Risk  Fall Risk Patient at Risk for Falls Due to: No Fall Risks Fall risk Follow up: Falls evaluation  completed; Falls prevention discussed  Home and Transportation Safety: All rugs have non-skid backing?: N/A, no rugs All stairs or steps have railings?: yes Grab bars in the bathtub or shower?: (!) no Have non-skid surface in bathtub or shower?: yes Good home lighting?: yes Regular seat belt use?: yes  Cognitive Assessment Difficulty concentrating, remembering, or making decisions? : no Will 6CIT or Mini Cog be Completed: no 6CIT or Mini Cog Declined: patient alert, oriented, able to answer questions appropriately and recall recent events  Advance Directives (For Healthcare) Does Patient Have a Medical Advance Directive?: Yes Type of Advance Directive: Healthcare Power of Attorney  Reviewed/Updated  Reviewed/Updated: Reviewed All (Medical, Surgical, Family, Medications, Allergies, Care Teams, Patient Goals)        Objective:    Today's Vitals   03/31/24 1502  Weight: 155 lb (70.3 kg)  Height: 5' 3 (1.6 m)   Body mass index is 27.46 kg/m.  Current Medications (verified) Outpatient Encounter Medications as of 03/31/2024  Medication Sig   Accu-Chek Softclix Lancets lancets Use as instructed   Alcohol Swabs (B-D SINGLE USE SWABS REGULAR) PADS Use to clean finger before checking blood sugar 2 times daily   amitriptyline  (ELAVIL ) 150 MG tablet Take 1 tablet (150 mg total) by mouth at bedtime.   aspirin 81 MG tablet Take 81 mg  by mouth daily.   atenolol  (TENORMIN ) 100 MG tablet Take 1 tablet (100 mg total) by mouth daily.   Blood Glucose Monitoring Suppl (ACCU-CHEK GUIDE ME) w/Device KIT 2 Sticks by Does not apply route 3 times/day as needed-between meals & bedtime. Used to check blood sugar 2 times daily   cetirizine (ZYRTEC) 10 MG tablet Take 10 mg by mouth as needed.   cholecalciferol (VITAMIN D ) 1000 UNITS tablet Take 1,000 Units by mouth daily.   Chromium-Cinnamon 100-500 MCG-MG CAPS Take by mouth 2 (two) times daily.   Cyanocobalamin (B-12) 3000 MCG CAPS Take 3,000 mg  by mouth daily.   empagliflozin  (JARDIANCE ) 10 MG TABS tablet Take 1 tablet (10 mg total) by mouth daily before breakfast.   fenofibrate  (TRICOR ) 145 MG tablet Take 1 tablet (145 mg total) by mouth daily.   fish oil-omega-3 fatty acids 1000 MG capsule Take 1 g by mouth daily.   glucose blood (ACCU-CHEK GUIDE TEST) test strip 1 each by Other route See admin instructions.   lisinopril  (ZESTRIL ) 40 MG tablet Take 1 tablet by mouth once daily   metFORMIN  (GLUCOPHAGE ) 1000 MG tablet TAKE 1 TABLET BY MOUTH TWICE DAILY WITH MEALS   Multiple Vitamin (MULTIVITAMIN) tablet Take 1 tablet by mouth daily.   simvastatin  (ZOCOR ) 5 MG tablet Take 1 tablet (5 mg total) by mouth daily.   No facility-administered encounter medications on file as of 03/31/2024.   Hearing/Vision screen Hearing Screening - Comments:: Denies hearing difficulties   Vision Screening - Comments:: Wears eyeglasses/UTD/[employer] Immunizations and Health Maintenance Health Maintenance  Topic Date Due   DTaP/Tdap/Td (2 - Tdap) 12/12/2015   Zoster Vaccines- Shingrix (2 of 2) 12/10/2022   Influenza Vaccine  12/13/2023   Medicare Annual Wellness (AWV)  01/02/2024   COVID-19 Vaccine (8 - 2025-26 season) 01/13/2024   FOOT EXAM  03/20/2024   HEMOGLOBIN A1C  06/19/2024   Diabetic kidney evaluation - eGFR measurement  07/10/2024   Diabetic kidney evaluation - Urine ACR  07/10/2024   Mammogram  11/19/2024   OPHTHALMOLOGY EXAM  01/15/2025   Colonoscopy  02/28/2025   Pneumococcal Vaccine: 50+ Years  Completed   DEXA SCAN  Completed   Hepatitis C Screening  Completed   Meningococcal B Vaccine  Aged Out        Assessment/Plan:  This is a routine wellness examination for [place].  Patient Care Team: Merna Huxley, NP as PCP - General (Family Medicine) [place], My [place], [place] as Referring Physician (Optometry)  I have personally reviewed and noted the following in the patient's chart:   Medical and social history Use of alcohol, tobacco  or illicit drugs  Current medications and supplements including opioid prescriptions. Functional ability and status Nutritional status Physical activity Advanced directives List of other physicians Hospitalizations, surgeries, and ER visits in previous 12 months Vitals Screenings to include cognitive, depression, and falls Referrals and appointments  No orders of the defined types were placed in this encounter.  In addition, I have reviewed and discussed with patient certain preventive protocols, quality metrics, and best practice recommendations. A written personalized care plan for preventive services as well as general preventive health recommendations were provided to patient.   Mikhai Bienvenue L Audreena Sachdeva, CMA   03/31/2024   No follow-ups on file.  After Visit Summary: (MyChart) Due to this being a telephonic visit, the after visit summary with patients personalized plan was offered to patient via MyChart   Nurse Notes: Patient is due or a Flu vaccine.  Patient is  up to date on all other health maintenance with no concerns to address today.

## 2024-04-13 ENCOUNTER — Ambulatory Visit (HOSPITAL_BASED_OUTPATIENT_CLINIC_OR_DEPARTMENT_OTHER)
Admission: RE | Admit: 2024-04-13 | Discharge: 2024-04-13 | Disposition: A | Discharge status: Home / Self Care | Source: Ambulatory Visit | Attending: Adult Health | Admitting: Adult Health

## 2024-04-13 DIAGNOSIS — E2839 Other primary ovarian failure: Secondary | ICD-10-CM | POA: Diagnosis not present

## 2024-04-13 DIAGNOSIS — Z780 Asymptomatic menopausal state: Secondary | ICD-10-CM | POA: Diagnosis not present

## 2024-04-14 ENCOUNTER — Ambulatory Visit: Payer: Self-pay | Admitting: Adult Health

## 2024-04-17 NOTE — Telephone Encounter (Signed)
 FYI

## 2024-05-04 ENCOUNTER — Other Ambulatory Visit: Payer: Self-pay | Admitting: Adult Health

## 2024-05-04 DIAGNOSIS — E782 Mixed hyperlipidemia: Secondary | ICD-10-CM

## 2024-05-04 DIAGNOSIS — E1142 Type 2 diabetes mellitus with diabetic polyneuropathy: Secondary | ICD-10-CM

## 2024-05-12 ENCOUNTER — Other Ambulatory Visit: Payer: Self-pay | Admitting: Adult Health

## 2024-05-12 DIAGNOSIS — E119 Type 2 diabetes mellitus without complications: Secondary | ICD-10-CM

## 2024-07-14 ENCOUNTER — Encounter: Admitting: Adult Health
